# Patient Record
Sex: Female | Born: 1950
Health system: Southern US, Community
[De-identification: ages and names within clinical notes are randomized; demographics above are authoritative.]

## PROBLEM LIST (undated history)

## (undated) DIAGNOSIS — R112 Nausea with vomiting, unspecified: Secondary | ICD-10-CM

## (undated) DIAGNOSIS — Z9889 Other specified postprocedural states: Secondary | ICD-10-CM

## (undated) DIAGNOSIS — M1990 Unspecified osteoarthritis, unspecified site: Secondary | ICD-10-CM

## (undated) DIAGNOSIS — I10 Essential (primary) hypertension: Secondary | ICD-10-CM

## (undated) DIAGNOSIS — E119 Type 2 diabetes mellitus without complications: Secondary | ICD-10-CM

## (undated) DIAGNOSIS — E039 Hypothyroidism, unspecified: Secondary | ICD-10-CM

## (undated) HISTORY — PX: THYROIDECTOMY: SHX17

## (undated) HISTORY — PX: ABDOMINAL HYSTERECTOMY: SHX81

## (undated) HISTORY — PX: KNEE ARTHROSCOPY: SUR90

## (undated) HISTORY — PX: TUBAL LIGATION: SHX77

---
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
## 2001-05-02 ENCOUNTER — Encounter: Payer: Self-pay | Admitting: Family Medicine

## 2001-05-02 ENCOUNTER — Ambulatory Visit (HOSPITAL_COMMUNITY): Admission: RE | Admit: 2001-05-02 | Discharge: 2001-05-02 | Payer: Self-pay | Admitting: Family Medicine

## 2001-06-23 ENCOUNTER — Encounter: Payer: Self-pay | Admitting: *Deleted

## 2001-06-23 ENCOUNTER — Emergency Department (HOSPITAL_COMMUNITY): Admission: EM | Admit: 2001-06-23 | Discharge: 2001-06-23 | Payer: Self-pay | Admitting: Family Medicine

## 2009-01-17 ENCOUNTER — Emergency Department (HOSPITAL_COMMUNITY): Admission: EM | Admit: 2009-01-17 | Discharge: 2009-01-17 | Payer: Self-pay | Admitting: Emergency Medicine

## 2009-08-12 ENCOUNTER — Inpatient Hospital Stay (HOSPITAL_COMMUNITY): Admission: EM | Admit: 2009-08-12 | Discharge: 2009-08-13 | Payer: Self-pay | Admitting: Emergency Medicine

## 2010-09-08 LAB — DIFFERENTIAL
Basophils Absolute: 0.1 10*3/uL (ref 0.0–0.1)
Basophils Relative: 0 % (ref 0–1)
Basophils Relative: 0 % (ref 0–1)
Eosinophils Absolute: 0.1 10*3/uL (ref 0.0–0.7)
Eosinophils Relative: 1 % (ref 0–5)
Lymphocytes Relative: 8 % — ABNORMAL LOW (ref 12–46)
Lymphs Abs: 3.6 10*3/uL (ref 0.7–4.0)
Monocytes Absolute: 1.2 10*3/uL — ABNORMAL HIGH (ref 0.1–1.0)
Monocytes Relative: 8 % (ref 3–12)
Neutro Abs: 19.4 10*3/uL — ABNORMAL HIGH (ref 1.7–7.7)
Neutrophils Relative %: 86 % — ABNORMAL HIGH (ref 43–77)

## 2010-09-08 LAB — COMPREHENSIVE METABOLIC PANEL
ALT: 17 U/L (ref 0–35)
ALT: 22 U/L (ref 0–35)
AST: 16 U/L (ref 0–37)
AST: 21 U/L (ref 0–37)
Albumin: 3 g/dL — ABNORMAL LOW (ref 3.5–5.2)
Albumin: 4.2 g/dL (ref 3.5–5.2)
Alkaline Phosphatase: 54 U/L (ref 39–117)
Alkaline Phosphatase: 72 U/L (ref 39–117)
BUN: 15 mg/dL (ref 6–23)
CO2: 26 mEq/L (ref 19–32)
Calcium: 8.1 mg/dL — ABNORMAL LOW (ref 8.4–10.5)
Calcium: 9.5 mg/dL (ref 8.4–10.5)
Chloride: 105 mEq/L (ref 96–112)
Creatinine, Ser: 0.77 mg/dL (ref 0.4–1.2)
GFR calc Af Amer: >60 mL/min (ref >60)
GFR calc Af Amer: >60 mL/min (ref >60)
GFR calc non Af Amer: >60 mL/min (ref >60)
Glucose, Bld: 118 mg/dL — ABNORMAL HIGH (ref 70–99)
Glucose, Bld: 149 mg/dL — ABNORMAL HIGH (ref 70–99)
Potassium: 4.1 mEq/L (ref 3.5–5.1)
Potassium: 4.7 mEq/L (ref 3.5–5.1)
Sodium: 140 mEq/L (ref 135–145)
Sodium: 141 mEq/L (ref 135–145)
Total Bilirubin: 0.7 mg/dL (ref 0.3–1.2)
Total Protein: 6 g/dL (ref 6.0–8.3)
Total Protein: 7.6 g/dL (ref 6.0–8.3)

## 2010-09-08 LAB — CBC
HCT: 44.6 % (ref 36.0–46.0)
Hemoglobin: 12 g/dL (ref 12.0–15.0)
Hemoglobin: 14.7 g/dL (ref 12.0–15.0)
MCHC: 32.9 g/dL (ref 30.0–36.0)
MCHC: 34 g/dL (ref 30.0–36.0)
MCV: 88.4 fL (ref 78.0–100.0)
Platelets: 379 10*3/uL (ref 150–400)
RBC: 4.01 MIL/uL (ref 3.87–5.11)
RBC: 5.05 MIL/uL (ref 3.87–5.11)
RDW: 15.9 % — ABNORMAL HIGH (ref 11.5–15.5)
RDW: 16.2 % — ABNORMAL HIGH (ref 11.5–15.5)
WBC: 22.6 10*3/uL — ABNORMAL HIGH (ref 4.0–10.5)

## 2010-09-08 LAB — URINE CULTURE: Colony Count: >=100000

## 2010-09-08 LAB — URINE MICROSCOPIC-ADD ON

## 2010-09-08 LAB — URINALYSIS, ROUTINE W REFLEX MICROSCOPIC
Nitrite: NEGATIVE
Protein, ur: 30 mg/dL — AB
Specific Gravity, Urine: >1.03 — ABNORMAL HIGH (ref 1.005–1.030)
Urobilinogen, UA: 0.2 mg/dL (ref 0.0–1.0)

## 2010-09-08 LAB — LIPASE, BLOOD: Lipase: 27 U/L (ref 11–59)

## 2010-09-08 LAB — TSH: TSH: 4.956 u[IU]/mL — ABNORMAL HIGH (ref 0.350–4.500)

## 2013-11-25 ENCOUNTER — Other Ambulatory Visit (HOSPITAL_COMMUNITY): Payer: Self-pay | Admitting: Family Medicine

## 2013-11-25 ENCOUNTER — Encounter (INDEPENDENT_AMBULATORY_CARE_PROVIDER_SITE_OTHER): Payer: Self-pay

## 2013-11-25 ENCOUNTER — Ambulatory Visit (HOSPITAL_COMMUNITY)
Admission: RE | Admit: 2013-11-25 | Discharge: 2013-11-25 | Disposition: A | Discharge status: Home / Self Care | Payer: BC Managed Care – PPO | Source: Ambulatory Visit | Attending: Family Medicine | Admitting: Family Medicine

## 2013-11-25 DIAGNOSIS — M545 Low back pain, unspecified: Secondary | ICD-10-CM

## 2013-11-25 DIAGNOSIS — M47817 Spondylosis without myelopathy or radiculopathy, lumbosacral region: Secondary | ICD-10-CM | POA: Insufficient documentation

## 2013-11-25 DIAGNOSIS — M5430 Sciatica, unspecified side: Secondary | ICD-10-CM

## 2013-12-26 ENCOUNTER — Ambulatory Visit (HOSPITAL_COMMUNITY)
Admission: RE | Admit: 2013-12-26 | Discharge: 2013-12-26 | Disposition: A | Discharge status: Home / Self Care | Payer: BC Managed Care – PPO | Source: Ambulatory Visit | Attending: Orthopedic Surgery | Admitting: Orthopedic Surgery

## 2013-12-26 DIAGNOSIS — R269 Unspecified abnormalities of gait and mobility: Secondary | ICD-10-CM

## 2013-12-26 DIAGNOSIS — M25676 Stiffness of unspecified foot, not elsewhere classified: Secondary | ICD-10-CM | POA: Insufficient documentation

## 2013-12-26 DIAGNOSIS — M545 Low back pain, unspecified: Secondary | ICD-10-CM | POA: Insufficient documentation

## 2013-12-26 DIAGNOSIS — M546 Pain in thoracic spine: Secondary | ICD-10-CM | POA: Insufficient documentation

## 2013-12-26 DIAGNOSIS — M6281 Muscle weakness (generalized): Secondary | ICD-10-CM | POA: Insufficient documentation

## 2013-12-26 DIAGNOSIS — M25619 Stiffness of unspecified shoulder, not elsewhere classified: Secondary | ICD-10-CM | POA: Insufficient documentation

## 2013-12-26 DIAGNOSIS — M25673 Stiffness of unspecified ankle, not elsewhere classified: Secondary | ICD-10-CM | POA: Insufficient documentation

## 2013-12-26 DIAGNOSIS — IMO0001 Reserved for inherently not codable concepts without codable children: Secondary | ICD-10-CM | POA: Insufficient documentation

## 2013-12-26 DIAGNOSIS — M25659 Stiffness of unspecified hip, not elsewhere classified: Secondary | ICD-10-CM | POA: Insufficient documentation

## 2013-12-26 NOTE — Evaluation (Signed)
Physical Therapy Evaluation  Patient Details  Name: Patricia HuaDebbie A Diven MRN: [id number] Date of Birth: 04/21/1951  Today's Date: 12/26/2013 Time: 0930-1015 PT Time Calculation (min): 45 min     Charges: 1 evaluation, 1005-1015 therEx         Visit#: 1 of 16  Re-eval: 01/25/14 Assessment Diagnosis: Low back pain secondary to arthritis Prior Therapy: no  Authorization: BCBS    Authorization Time Period:    Authorization Visit#: 1 of 30   Past Medical History: No past medical history on file. Past Surgical History: No past surgical history on file.  Subjective Symptoms/Limitations Symptoms: Low back pain throbbing, catching Pertinent History: patient has a long history of low back pain for the last 17 years. last January (1.5 years ago), patient was walking 2 miles a day and she began to feel it catching and was unable to walk for extended perids since then. Pain is in thoracolumbar area radiating into Lt and occasionally Rt hip.  Limitations: Walking;Standing How long can you sit comfortably?: must change positions How long can you stand comfortably?: 45 minutes How long can you walk comfortably?: 20minutes Patient Stated Goals: to be able to wlk for 60minutes Pain Assessment Currently in Pain?: Yes Pain Score: 6  Pain Location: Back (between shoulder blades) Pain Orientation: Posterior;Lower Pain Onset: More than a month ago Pain Frequency: Constant Pain Relieving Factors: medication, ice, heat,  Effect of Pain on Daily Activities: pain with puch mowing  Cognition/Observation Observation/Other Assessments Observations: limited anterior to posterior mobility thoracic and lumbar spine throughout, tenderness along L3-L5 and PSIS Other Assessments: Hip alignment neutral  Sensation/Coordination/Flexibility/Functional Tests Flexibility Thomas: Positive Obers: Negative 90/90: Negative Functional Tests Functional Tests: 3D hip excursions: limited hip extension, Limited Rt  Frontal plane sway, Limited Lt hip Internal rotation Functional Tests: Walking: excessive toe out, limited hip internal rotation, limited hip internal rotatrion, llimited extension, limited dorsiflexion/eary heel rise. limited calcaneal eversion Functional Tests: Posture: Forward head, forward rounded shoulders,   Assessment LLE AROM (degrees) Left Hip External Rotation : 47 Left Hip Internal Rotation : 24 Left Ankle Dorsiflexion: 14 LLE Strength Left Hip Extension: 4/5 Left Hip ABduction: 3+/5 Lumbar AROM Lumbar Flexion: WNL Lumbar Extension: 28 Lumbar Strength Lumbar Flexion: 4/5 Lumbar Extension: 4/5  Exercise/Treatments Hip flexor stretch 5x 10 seconds Piriformis stretch 5x 10seconds Physical Therapy Assessment and Plan PT Assessment and Plan Clinical Impression Statement: Patient displasy thoracolumbar pain secodnary to hip, ankle and shoulder stifness, postural abnormalities includign increased forward head and forward rounded shoulders, and glut maximus and medius and trunk flexor/extensor weakness. Patient lumbar spine pain is therefore attribted to limited lifting mechanics that will be improved with correction of hip, ankle and shoulder mobility and strengtheing of glut and trunk muscles.  Pt will benefit from skilled therapeutic intervention in order to improve on the following deficits: Abnormal gait;Decreased activity tolerance;Decreased strength;Impaired flexibility;Decreased range of motion;Improper body mechanics;Pain Rehab Potential: Good PT Frequency: Min 2X/week PT Duration: 8 weeks PT Treatment/Interventions: Gait training;Stair training;Therapeutic activities;Therapeutic exercise;Modalities;Manual techniques;Patient/family education PT Plan: Initia;l focus: improve glut, piriformis, hip flexor, rectus femoris, and pectoral mobility, and introduce deep neck flexor strengthening and throacic spine mobility exercises. As pain and mobility improve progress and add glut  strengtheing, mid and lower trapezious and rowing strenghtieng exercises to improve body mechanics for lifting, and increase trunk stabilization .     Goals PT Short Term Goals Time to Complete Short Term Goals: 4 weeks PT Short Term Goal 1: Patient will display increased hip internal roation  to 40 degrees to increase shock absorbtion during transitioanl zone one of gait PT Short Term Goal 2: Patient will increase hip extesnion to 15 degrees in improve rectos femoris/illiopsas flexibity resulting in a negative thomas test so patient can ambulate with increased stride length and decreased forward bending PT Short Term Goal 3: Patient will increase anke dorsiflexion to 20 degrees to improve early heel off during gait PT Short Term Goal 4: Patient will be able to ambulate >40 minutes with pain <3/10 PT Short Term Goal 5: Patient will increase shoulder horizontal abduction to 30 degrees indicatign improved pectoralis mobility PT Long Term Goals Time to Complete Long Term Goals: 8 weeks PT Long Term Goal 1: Patient will increased hip extension strength to 4+/5 MMT to be able to squat to the ground and lift 10lb off the floor 20x without low back pain PT Long Term Goal 2: Patient will increase hip abduction strength to 4+/5 MMT to be able to stand on one leg >10 seconds withtou trendelenberg sign Long Term Goal 3: Patient will increased trunk flexion/extesnion strength to 5/5/ MMT to be able to stand/walk > 1 hour without pain >2/10  Problem List Patient Active Problem List   Diagnosis Date Noted  . Pain in thoracic spine 12/26/2013  . Lumbago 12/26/2013  . Stiffness of joint, not elsewhere classified, pelvic region and thigh 12/26/2013  . Abnormality of gait 12/26/2013    PT - End of Session Activity Tolerance: Patient tolerated treatment well General Behavior During Therapy: WFL for tasks assessed/performed PT Plan of Care PT Home Exercise Plan: Piriformis and hip flexor stretches1 way (to  be progressed to 3 way next session  GP    Tysheem Accardo R 12/26/2013, 12:50 PM  Physician Documentation Your signature is required to indicate approval of the treatment plan as stated above.  Please sign and either send electronically or make a copy of this report for your files and return this physician signed original.   Please mark one 1.__approve of plan  2. ___approve of plan with the following conditions.   ______________________________                                                          _____________________ Physician Signature                                                                                                             Date

## 2013-12-31 ENCOUNTER — Ambulatory Visit (HOSPITAL_COMMUNITY)
Admission: RE | Admit: 2013-12-31 | Discharge: 2013-12-31 | Disposition: A | Discharge status: Home / Self Care | Payer: BC Managed Care – PPO | Source: Ambulatory Visit | Attending: Orthopedic Surgery | Admitting: Orthopedic Surgery

## 2013-12-31 NOTE — Progress Notes (Signed)
Physical Therapy Treatment Patient Details  Name: Patricia HuaDebbie A Vanzandt MRN: [id number] Date of Birth: 09/23/1950  Today's Date: 12/31/2013 Time: 1020-1100 PT Time Calculation (min): 40 min Charge: TE 1020-1120  Visit#: 2 of 16  Re-eval: 01/25/14    Authorization: BCBS  Authorization Time Period:    Authorization Visit#: 2 of 30   Subjective: Symptoms/Limitations Symptoms: Pt reports compliance with HEP, stated Lt butrocks region dull pain scale 3/10 Pain Assessment Currently in Pain?: Yes Pain Score: 3  Pain Location: Buttocks Pain Orientation: Left  Objective   Exercise/Treatments Stretches Active Hamstring Stretch: 3 reps;30 seconds;Limitations Active Hamstring Stretch Limitations: standing 14 in box Hip Flexor Stretch: 2 reps;30 seconds Quad Stretch: 3 reps;30 seconds;Limitations Quad Stretch Limitations: kneeling  Piriformis Stretch: 3 reps;10 seconds;Limitations Piriformis Stretch Limitations: seated with LE crossed Standing Other Standing Lumbar Exercises: 3D hip excursion Seated Other Seated Lumbar Exercises: 3D thoracic excursion 5x with UE reach 5x 3"; 3D neck stretch    Physical Therapy Assessment and Plan PT Assessment and Plan Clinical Impression Statement: Began PT POC to improve hip mobility and LE flexibiltiy.  Pt educated on improtance of posture to reduce strain on lumbar musculature.  Began 3D thoracic excursion with therapist facilitation to improve extension.  Pt given HEP worksheet to improve thoracic and hip mobility.  No reports of pain through session.   PT Plan: Initia;l focus: improve glut, piriformis, hip flexor, rectus femoris, and pectoral mobility, and introduce deep neck flexor strengthening and throacic spine mobility exercises. As pain and mobility improve progress and add glut strengtheing, mid and lower trapezious and rowing strenghtieng exercises to improve body mechanics for lifting, and increase trunk stabilization .     Goals PT Short  Term Goals PT Short Term Goal 1: Patient will display increased hip internal roation to 40 degrees to increase shock absorbtion during transitioanl zone one of gait PT Short Term Goal 1 - Progress: Progressing toward goal PT Short Term Goal 2: Patient will increase hip extesnion to 15 degrees in improve rectos femoris/illiopsas flexibity resulting in a negative thomas test so patient can ambulate with increased stride length and decreased forward bending PT Short Term Goal 2 - Progress: Progressing toward goal PT Short Term Goal 3: Patient will increase anke dorsiflexion to 20 degrees to improve early heel off during gait PT Short Term Goal 4: Patient will be able to ambulate >40 minutes with pain <3/10 PT Short Term Goal 5: Patient will increase shoulder horizontal abduction to 30 degrees indicatign improved pectoralis mobility PT Long Term Goals PT Long Term Goal 1: Patient will increased hip extension strength to 4+/5 MMT to be able to squat to the ground and lift 10lb off the floor 20x without low back pain PT Long Term Goal 2: Patient will increase hip abduction strength to 4+/5 MMT to be able to stand on one leg >10 seconds withtou trendelenberg sign Long Term Goal 3: Patient will increased trunk flexion/extesnion strength to 5/5/ MMT to be able to stand/walk > 1 hour without pain >2/10 Long Term Goal 3 Progress: Progressing toward goal  Problem List Patient Active Problem List   Diagnosis Date Noted  . Pain in thoracic spine 12/26/2013  . Lumbago 12/26/2013  . Stiffness of joint, not elsewhere classified, pelvic region and thigh 12/26/2013  . Abnormality of gait 12/26/2013    PT - End of Session Activity Tolerance: Patient tolerated treatment well General [facility] Therapy[facility] PT Plan of Care PT Home Exercise Plan: 3D  hip and thoracic excursion.    GP    Juel Burrow 12/31/2013, 12:10 PM

## 2014-01-02 ENCOUNTER — Ambulatory Visit (HOSPITAL_COMMUNITY)
Admission: RE | Admit: 2014-01-02 | Discharge: 2014-01-02 | Disposition: A | Discharge status: Home / Self Care | Payer: BC Managed Care – PPO | Source: Ambulatory Visit | Attending: Orthopedic Surgery | Admitting: Orthopedic Surgery

## 2014-01-02 NOTE — Progress Notes (Signed)
Physical Therapy Treatment Patient Details  Name: Patricia [name] MRN: [id number] Date of Birth: [birth year]  Today's Date: 01/02/2014 Time: 1102-1145 PT Time Calculation (min): 43 min Visit#: 3 of 16  Re-eval: 01/25/14 Authorization: BCBS  Authorization Visit#: 3 of 30  Charges:  Manual 1102-1110 (8'), self care 1110-1118 (8'), therex 1119-1145 (26')  Subjective: Symptoms/Limitations Symptoms: Pt states she is hurting today in her Lt PSIS region.  States it is basically the same as last visit.  Pain Assessment Currently in Pain?: Yes Pain Score: 3  Pain Location: Buttocks Pain Orientation: Left   Exercise/Treatments Stretches Active Hamstring Stretch: 3 reps;30 seconds;Limitations Active Hamstring Stretch Limitations: standing 14 in box Hip Flexor Stretch: 2 reps;30 seconds Aerobic Tread Mill: 5' 1.0 mph incline 3 following MET  Standing Other Standing Lumbar Exercises: 3D hip excursion Other Standing Lumbar Exercises: gastroc stretch 3X30"    Manual Therapy Manual Therapy: Other (comment) Other Manual Therapy: Muscle energy technique for Lt anterior rotation f/b walking on treadmill  Physical Therapy Assessment and Plan PT Assessment and Plan Clinical Impression Statement: SI assessed with noted Lt anterior rotation.  Muscle energy technique performed with good results and even posturing. Pt educated on importance of hip alignment and mechanism of muscle imbalance.   Continued to improve hip mobility and LE flexibiltiy. No reports of pain through session and reported being painfree at end of session. PT Plan: Initial focus: improve glut, piriformis, hip flexor, rectus femoris, and pectoral mobility, and introduce deep neck flexor strengthening and throacic spine mobility exercises. As pain and mobility improve progress and add glut strengtheing, mid and lower trapezious and rowing strenghtieng exercises to improve body mechanics for lifting, and increase trunk stabilization  .      Problem List Patient Active Problem List   Diagnosis Date Noted  . Pain in thoracic spine 12/26/2013  . Lumbago 12/26/2013  . Stiffness of joint, not elsewhere classified, pelvic region and thigh 12/26/2013  . Abnormality of gait 12/26/2013    PT - End of Session Activity Tolerance: Patient tolerated treatment well General Behavior During Therapy: WFL for tasks assessed/performed PT Plan of Care PT Home Exercise Plan: 3D hip and thoracic excursion.    GP    Teena Irani, PTA/CLT 01/02/2014, 11:47 AM

## 2014-01-07 ENCOUNTER — Ambulatory Visit (HOSPITAL_COMMUNITY)
Admission: RE | Admit: 2014-01-07 | Discharge: 2014-01-07 | Disposition: A | Discharge status: Home / Self Care | Payer: BC Managed Care – PPO | Source: Ambulatory Visit | Attending: Orthopedic Surgery | Admitting: Orthopedic Surgery

## 2014-01-07 NOTE — Progress Notes (Signed)
Physical Therapy Treatment Patient Details  Name: Patricia [name] MRN: [id number] Date of Birth: Oct 27, 1950  Today's Date: 01/07/2014 Time: 4098-1191 PT Time Calculation (min): 41 min  Visit#: 4 of 16  Re-eval: 01/25/14 Authorization: BCBS  Authorization Visit#: 4 of 30  Charges:  Manual 1104-1114 (10'), therex 1115-1145 (30)  Subjective: Symptoms/Limitations Symptoms: Pt states her lower back and hips feel much better,  however having pain in her thoracic area.   Pain Assessment Currently in Pain?: Yes Pain Score: 6  Pain Location: Thoracic Pain Orientation: Mid   Exercise/Treatments Stretches Active Hamstring Stretch: 3 reps;30 seconds;Limitations Active Hamstring Stretch Limitations: standing 8 in box Standing Extension: Limitations Standing Extension Limitations: corner stretch 3X30" Piriformis Stretch: 3 reps;30 seconds Piriformis Stretch Limitations: seated with LE crossed Standing Functional Squats: Limitations Functional Squats Limitations: 3D hip excursions 10 reps each Scapular Retraction: 10 reps;Theraband Theraband Level (Scapular Retraction): Level 3 (Green) Row: 10 reps;Theraband Theraband Level (Row): Level 3 (Green) Shoulder Extension: 10 reps;Theraband Theraband Level (Shoulder Extension): Level 3 (Green) Other Standing Lumbar Exercises: SLS max of 3:  Lt: 21", Rt: 15" Other Standing Lumbar Exercises: gastroc stretch 3X30" Seated Other Seated Lumbar Exercises: 3D thoracic excursion 5x with UE reach 5x 3"; 3D neck stretch Prone  Other Prone Lumbar Exercises: rows, shoulder extension 10 reps Other Prone Lumbar Exercises: heelsqueezes 10X5" holds   Manual Therapy Manual Therapy: Massage Massage: prone thoracic region to decrease spasms/pain  Physical Therapy Assessment and Plan PT Assessment and Plan Clinical Impression Statement: Focused on thoracic region today.  Progressed with prone stab exercises and therband activities to increase postural  stregnth and stability.  Massage to thoracic region in prone revealed small spasm Lt rhomboid region and general tightness throughout.  Overall pain reduction to 3/10 at end of session.  SI assessed without need for MET today.  PT Plan: Initia;l focus: improve glut, piriformis, hip flexor, rectus femoris, and pectoral mobility, and introduce deep neck flexor strengthening and throacic spine mobility exercises. As pain and mobility improve progress and add glut strengtheing, mid and lower trapezious and rowing strenghtieng exercises to improve body mechanics for lifting, and increase trunk stabilization .      Problem List Patient Active Problem List   Diagnosis Date Noted  . Pain in thoracic spine 12/26/2013  . Lumbago 12/26/2013  . Stiffness of joint, not elsewhere classified, pelvic region and thigh 12/26/2013  . Abnormality of gait 12/26/2013    PT - End of Session Activity Tolerance: Patient tolerated treatment well General Behavior During Therapy: WFL for tasks assessed/performed PT Plan of Care PT Home Exercise Plan: 3D hip and thoracic excursion.     Teena Irani, PTA/CLT 01/07/2014, 11:46 AM

## 2014-01-09 ENCOUNTER — Ambulatory Visit (HOSPITAL_COMMUNITY)
Admission: RE | Admit: 2014-01-09 | Discharge: 2014-01-09 | Disposition: A | Discharge status: Home / Self Care | Payer: BC Managed Care – PPO | Source: Ambulatory Visit | Attending: Orthopedic Surgery | Admitting: Orthopedic Surgery

## 2014-01-09 NOTE — Progress Notes (Signed)
Physical Therapy Treatment Patient Details  Name: Patricia [name] MRN: [id number] Date of Birth: 09/03/1950  Today's Date: 01/09/2014 Time: 1100-1140 PT Time Calculation (min): 40 min  Visit#: 5 of 16  Re-eval: 01/25/14 Authorization: BCBS  Authorization Visit#: 4 of 30 Charges:  therex 38'   Subjective: Symptoms/Limitations Symptoms: Pt states she had a little pain in her thoracic area after last visit but none since then.  Currently painfree. Pain Assessment Currently in Pain?: No/denies   Exercise/Treatments Stretches Active Hamstring Stretch: 2 reps;30 seconds;Limitations Active Hamstring Stretch Limitations: 3 way, standing 8 in box Standing Extension: Limitations Standing Extension Limitations: corner stretch 2X30" Standing Functional Squats: Limitations Functional Squats Limitations: 3D hip excursions 10 reps each Scapular Retraction: 10 reps;Theraband Theraband Level (Scapular Retraction): Level 3 (Green) Row: 10 reps;Theraband Theraband Level (Row): Level 3 (Green) Shoulder Extension: 10 reps;Theraband Theraband Level (Shoulder Extension): Level 3 (Green) Other Standing Lumbar Exercises: gastroc stretch 2X30" Seated Other Seated Lumbar Exercises: 3D thoracic excursion 5x with UE reach 5x 3"; 3D neck stretch Prone  Other Prone Lumbar Exercises: rows, shoulder extension 10 reps Other Prone Lumbar Exercises: heelsqueezes 10X5" holds, alternating UE 5X, alternating LE 5X    Manual Therapy Manual Therapy: Massage Massage: prone thoracic region to decrease spasms/pain.  Physical Therapy Assessment and Plan PT Assessment and Plan Clinical Impression Statement: Continued to focus on thoracic region.  Checked SI alignment and is neutral.  Pt reports compliance with HEP.  No spasms/tightness palpated with manual to thoracic region, however patient reported tenderness/soreness to area.  Added prone UE/LE stab exercises and given theraband/written instructions for HEP.    PT Plan: Progress  glut strengthening.  Educate on proper body mechanics/lifting and continue to increase trunk stability.     Problem List Patient Active Problem List   Diagnosis Date Noted  . Pain in thoracic spine 12/26/2013  . Lumbago 12/26/2013  . Stiffness of joint, not elsewhere classified, pelvic region and thigh 12/26/2013  . Abnormality of gait 12/26/2013    PT - End of Session Activity Tolerance: Patient tolerated treatment well General Behavior During Therapy: WFL for tasks assessed/performed PT Plan of Care PT Home Exercise Plan: 3D hip and thoracic excursion.     Lurena NidaAmy B Haeleigh Streiff, PTA/CLT 01/09/2014, 11:52 AM

## 2014-01-14 ENCOUNTER — Ambulatory Visit (HOSPITAL_COMMUNITY)
Admission: RE | Admit: 2014-01-14 | Discharge: 2014-01-14 | Disposition: A | Discharge status: Home / Self Care | Payer: BC Managed Care – PPO | Source: Ambulatory Visit | Attending: Orthopedic Surgery | Admitting: Orthopedic Surgery

## 2014-01-14 NOTE — Progress Notes (Signed)
Physical Therapy Treatment Patient Details  Name: Patricia [name] MRN: [id number] Date of Birth: 02/28/1951  Today's Date: 01/14/2014 Time: 1100-1145 PT Time Calculation (min): 45 min  Visit#: 6 of 16  Re-eval: 01/25/14 Authorization: BCBS  Authorization Visit#: 6 of 30  Charges:  Manual 1100-1120 (20'), therex 1120-1145 (25')  Subjective: Symptoms/Limitations Symptoms: Pt states her pain is a lttle elevated today in her thoracic region and Lt SI due to push mowing her yard. Pain Assessment Currently in Pain?: Yes Pain Score: 2  Pain Location: Hip Pain Orientation: Left Pain Radiating Towards: and pain in thoracic region 2/10   Exercise/Treatments Stretches Active Hamstring Stretch: 2 reps;30 seconds;Limitations Active Hamstring Stretch Limitations: 3 way, standing 8 in box Standing Extension: Limitations Standing Extension Limitations: corner stretch 2X30" Aerobic Tread Mill: 5' 1.0 mph incline 3 following MET  Standing Functional Squats: Limitations Functional Squats Limitations: 3D hip excursions 10 reps each Forward Lunge: 10 reps;Limitations Forward Lunge Limitations: 6" step bilaterally Seated Other Seated Lumbar Exercises: 3D thoracic excursion 5x with UE reach 5x 3" Prone  Other Prone Lumbar Exercises: rows, shoulder extension, alternating UE/LE 10 reps Other Prone Lumbar Exercises: heelsqueezes 10X5" holds, alternating UE 5X, alternating LE 5X   Manual Therapy Manual Therapy: Other (comment) Massage: prone thoracic region to decrease spasms/pain Other Manual Therapy: Muscle energy technique for Lt anterior rotation f/b walking on treadmill  Physical Therapy Assessment and Plan PT Assessment and Plan Clinical Impression Statement: Pt with Lt anterior hip rotation and pain in thoracic region due to push mowing yard.   MET performed and corrected misalignment.  Prone massage to thoracic region aleviated pain in scapular region.   Reviewed body mechanics with  push/pull and able to demonstrate correct form.  will continue to progress body mechanics.  No pain at concusion of session. PT Plan: Progress  glut strengthening.  Educate on proper body mechanics/lifting and continue to increase trunk stability.     Problem List Patient Active Problem List   Diagnosis Date Noted  . Pain in thoracic spine 12/26/2013  . Lumbago 12/26/2013  . Stiffness of joint, not elsewhere classified, pelvic region and thigh 12/26/2013  . Abnormality of gait 12/26/2013    PT - End of Session Activity Tolerance: Patient tolerated treatment well General Behavior During Therapy: WFL for tasks assessed/performed PT Plan of Care PT Home Exercise Plan: 3D hip and thoracic excursion.     Amy B Frazier, PTA/CLT 01/14/2014, 11:47 AM  

## 2014-01-16 ENCOUNTER — Ambulatory Visit (HOSPITAL_COMMUNITY)
Admission: RE | Admit: 2014-01-16 | Discharge: 2014-01-16 | Disposition: A | Discharge status: Home / Self Care | Payer: BC Managed Care – PPO | Source: Ambulatory Visit | Attending: Orthopedic Surgery | Admitting: Orthopedic Surgery

## 2014-01-16 NOTE — Progress Notes (Signed)
Physical Therapy Treatment Patient Details  Name: Patricia [name] MRN: [id number] Date of Birth: [birth year]  Today's Date: 01/16/2014 Time: 1100-1140 PT Time Calculation (min): 40 min  Visit#: 7 of 16  Re-eval: 01/25/14 Authorization: BCBS  Authorization Visit#: 7 of 30  Charges:  Manual 1100-1110, therex 1110-1140  Subjective: Symptoms/Limitations Symptoms: Pt reports no pain today.   Pain Assessment Currently in Pain?: No/denies   Exercise/Treatments Stretches Active Hamstring Stretch: 2 reps;30 seconds;Limitations Active Hamstring Stretch Limitations: 3 way, standing 8 in box Standing Extension: Limitations Standing Extension Limitations: corner stretch 2X30" Standing Functional Squats: Limitations Functional Squats Limitations: 3D hip excursions 10 reps each Forward Lunge: 10 reps;Limitations Forward Lunge Limitations: 6" step bilaterally Other Standing Lumbar Exercises: gastroc stretch 2X30" Seated Other Seated Lumbar Exercises: 3D thoracic excursion 5x with UE reach 5x 3" Prone  Other Prone Lumbar Exercises: rows 15 reps, shoulder extension 15 reps, alternating UE/LE 10 reps Other Prone Lumbar Exercises: heelsqueezes 15X5" holds    Manual Therapy Manual Therapy: Other (comment) Massage: prone thoracic region to decrease spasms/pain,  No MET required today  Physical Therapy Assessment and Plan PT Assessment and Plan Clinical Impression Statement: Pt with good SI alignment today without need for MET.  Most tightness/spasm located in Lt thoracic region and able to resolve with manual techniques.  Able to increase reps with prone exercises today without difficulty.  Pt continued to report being painfree at end of session.   PT Plan: Progress  glut strengthening.  Educate on proper body mechanics/lifting and continue to increase trunk stability.     Problem List Patient Active Problem List   Diagnosis Date Noted  . Pain in thoracic spine 12/26/2013  . Lumbago  12/26/2013  . Stiffness of joint, not elsewhere classified, pelvic region and thigh 12/26/2013  . Abnormality of gait 12/26/2013    PT - End of Session Activity Tolerance: Patient tolerated treatment well General Behavior During Therapy: [facility] for tasks assessed/performed  GP    Teena Irani, PTA/CLT 01/16/2014, 11:39 AM

## 2014-01-20 ENCOUNTER — Ambulatory Visit (HOSPITAL_COMMUNITY)
Admission: RE | Admit: 2014-01-20 | Discharge: 2014-01-20 | Disposition: A | Discharge status: Home / Self Care | Payer: BC Managed Care – PPO | Source: Ambulatory Visit | Attending: Family Medicine | Admitting: Family Medicine

## 2014-01-20 DIAGNOSIS — M25659 Stiffness of unspecified hip, not elsewhere classified: Secondary | ICD-10-CM | POA: Insufficient documentation

## 2014-01-20 DIAGNOSIS — M25676 Stiffness of unspecified foot, not elsewhere classified: Secondary | ICD-10-CM | POA: Diagnosis not present

## 2014-01-20 DIAGNOSIS — M546 Pain in thoracic spine: Secondary | ICD-10-CM | POA: Diagnosis not present

## 2014-01-20 DIAGNOSIS — M545 Low back pain, unspecified: Secondary | ICD-10-CM | POA: Insufficient documentation

## 2014-01-20 DIAGNOSIS — M25673 Stiffness of unspecified ankle, not elsewhere classified: Secondary | ICD-10-CM | POA: Insufficient documentation

## 2014-01-20 DIAGNOSIS — IMO0001 Reserved for inherently not codable concepts without codable children: Secondary | ICD-10-CM | POA: Diagnosis not present

## 2014-01-20 DIAGNOSIS — M6281 Muscle weakness (generalized): Secondary | ICD-10-CM | POA: Diagnosis not present

## 2014-01-20 DIAGNOSIS — R269 Unspecified abnormalities of gait and mobility: Secondary | ICD-10-CM | POA: Diagnosis not present

## 2014-01-20 DIAGNOSIS — M25619 Stiffness of unspecified shoulder, not elsewhere classified: Secondary | ICD-10-CM | POA: Insufficient documentation

## 2014-01-20 NOTE — Progress Notes (Signed)
Physical Therapy Treatment Patient Details  Name: Patricia [name] MRN: [id number] Date of Birth: [birth year]  Today's Date: 01/20/2014 Time: 0940-1025 PT Time Calculation (min): 45 min Charge; TE [id number], Manual 1607-3710  Visit#: 8 of 16  Re-eval: 01/25/14 Assessment Diagnosis: Low back pain secondary to arthritis Prior Therapy: no  Authorization: BCBS  Authorization Time Period:    Authorization Visit#: 8 of 30   Subjective: Symptoms/Limitations Symptoms: Pt reports she is pain free today, compliant with HEP Pain Assessment Currently in Pain?: No/denies  Objective:   Exercise/Treatments Stretches Active Hamstring Stretch: 2 reps;30 seconds;Limitations Active Hamstring Stretch Limitations: 3 way, standing 8 in box Hip Flexor Stretch: 2 reps;30 seconds Standing Extension: Limitations Standing Extension Limitations: corner stretch 3X30" Piriformis Stretch: 3 reps;30 seconds Piriformis Stretch Limitations: seated with LE crossed Standing Functional Squats: Limitations Functional Squats Limitations: 3D hip excursions 10 reps each; 3 reps squat reach matrix with 1# Other Standing Lumbar Exercises: gastroc stretch 2X30" Seated Other Seated Lumbar Exercises: 3D thoracic excursion 5x with UE reach 10x 3" Other Seated Lumbar Exercises: Manual assistance with thoracic extension  Prone  Other Prone Lumbar Exercises: rows 15 reps 1#, wback 10x, horizontal abduction 10x, shoulder extension 15 reps, alternating UE/LE 10 reps  Manual Therapy Manual Therapy: Other (comment) Massage: prone thoracic region to decrease spasms/pain, Other Manual Therapy: SI within alignment, no MET required today.    Physical Therapy Assessment and Plan PT Assessment and Plan Clinical Impression Statement: Pt improving core musculature strengthening, SI within alignment with no MET required this session.  Session focus on improving hip mobility, gluteal and posture strengtheing.  Added squat reach  matrix and prone postural strengthening activities, pt able to demonstrate appropriate technique with all exercises with min cueing required for technique.  Able to resolve spasms in Lt Rhomboid and scapular region with manual techniques..  No reports of increased pain through session   PT Plan: Progress  glut strengthening.  Educate on proper body mechanics/lifting and continue to increase trunk stability.  Reassess next session.    Goals PT Short Term Goals PT Short Term Goal 1: Patient will display increased hip internal roation to 40 degrees to increase shock absorbtion during transitioanl zone one of gait PT Short Term Goal 1 - Progress: Progressing toward goal PT Short Term Goal 2: Patient will increase hip extesnion to 15 degrees in improve rectos femoris/illiopsas flexibity resulting in a negative thomas test so patient can ambulate with increased stride length and decreased forward bending PT Short Term Goal 2 - Progress: Progressing toward goal PT Short Term Goal 3: Patient will increase anke dorsiflexion to 20 degrees to improve early heel off during gait PT Short Term Goal 3 - Progress: Progressing toward goal PT Short Term Goal 4: Patient will be able to ambulate >40 minutes with pain <3/10 PT Short Term Goal 4 - Progress: Progressing toward goal PT Short Term Goal 5: Patient will increase shoulder horizontal abduction to 30 degrees indicatign improved pectoralis mobility PT Short Term Goal 5 - Progress: Progressing toward goal PT Long Term Goals PT Long Term Goal 1: Patient will increased hip extension strength to 4+/5 MMT to be able to squat to the ground and lift 10lb off the floor 20x without low back pain PT Long Term Goal 1 - Progress: Progressing toward goal PT Long Term Goal 2: Patient will increase hip abduction strength to 4+/5 MMT to be able to stand on one leg >10 seconds withtou trendelenberg sign PT Long Term Goal 2 -  Progress: Progressing toward goal Long Term Goal 3:  Patient will increased trunk flexion/extesnion strength to 5/5/ MMT to be able to stand/walk > 1 hour without pain >2/10  Problem List Patient Active Problem List   Diagnosis Date Noted  . Pain in thoracic spine 12/26/2013  . Lumbago 12/26/2013  . Stiffness of joint, not elsewhere classified, pelvic region and thigh 12/26/2013  . Abnormality of gait 12/26/2013    PT - End of Session Activity Tolerance: Patient tolerated treatment well General Behavior During Therapy: [employer] for tasks assessed/performed  GP    Aldona Lento 01/20/2014, 10:56 AM

## 2014-01-21 ENCOUNTER — Ambulatory Visit (HOSPITAL_COMMUNITY)
Admission: RE | Admit: 2014-01-21 | Discharge: 2014-01-21 | Disposition: A | Discharge status: Home / Self Care | Payer: BC Managed Care – PPO | Source: Ambulatory Visit | Attending: Family Medicine | Admitting: Family Medicine

## 2014-01-21 DIAGNOSIS — IMO0001 Reserved for inherently not codable concepts without codable children: Secondary | ICD-10-CM | POA: Diagnosis not present

## 2014-01-21 NOTE — Progress Notes (Signed)
Physical Therapy Re-evaluation/Treatment Note/ Discharge summary  Patient Details  Name: MONISHA SIEBEL MRN: [id number] Date of Birth: 08/26/1950  Today's Date: 01/21/2014 Time: 6144-3154 PT Time Calculation (min): 38 min Charge: TE [id number], MMT/ROM Measurement 5093-2671              Visit#: 9 of 16  Re-eval: 01/25/14 Assessment Diagnosis: Low back pain secondary to arthritis Next MD Visit: Mina Marble 02/04/2014 Prior Therapy: no  Authorization: BCBS    Authorization Time Period:    Authorization Visit#: 9 of 30   Subjective Symptoms/Limitations Symptoms: Pain free today, was sore following session  How long can you sit comfortably?: 2-3 hours (must change positions) How long can you stand comfortably?: 60-90 minutes (was 45 minutes) How long can you walk comfortably?: 60-90  minutes (was 61mnutes) Pain Assessment Currently in Pain?: No/denies  Prior Functioning  Home Living Additional Comments: FOTO 76% status (was 88% status)  Sensation/Coordination/Flexibility/Functional Tests Flexibility Thomas: Negative Obers: Negative 90/90: Negative  Assessment LLE AROM (degrees) Left Hip External Rotation : 47 Left Hip Internal Rotation : 40 (was 40) Left Ankle Dorsiflexion: 20 (was 14) LLE Strength Left Hip Extension: 5/5 (4/5) Left Hip ABduction: 5/5 (was 3+/5) Lumbar AROM Lumbar Flexion: WNL Lumbar Extension: 32 (was 28) Lumbar Strength Lumbar Flexion: 4/5 Lumbar Extension: 4/5  Exercise/Treatments Stretches Active Hamstring Stretch: 2 reps;30 seconds;Limitations Active Hamstring Stretch Limitations: 3 way, standing 8 in box Hip Flexor Stretch: 2 reps;30 seconds Standing Extension: Limitations Standing Extension Limitations: corner stretch 3X30" Piriformis Stretch: 3 reps;30 seconds Piriformis Stretch Limitations: seated with LE crossed Standing Functional Squats: Limitations Functional Squats Limitations: 3D hip excursions 10 reps each; 3 reps squat reach  matrix with 1#    Physical Therapy Assessment and Plan PT Assessment and Plan Clinical Impression Statement: Reassessment complete with the following findings: Pt complinat with HEP and able to demonstrate appropriate form and technqiues wtih all stretches and exercies. Improved hip mobilty and improved strength. Improved core strength wtih no muscle energy technique for SI alignment required for several sessions now. Pt reports pain free PT Plan: D/C to HEP.    Goals PT Short Term Goals PT Short Term Goal 1: Patient will display increased hip internal roation to 40 degrees to increase shock absorbtion during transitioanl zone one of gait PT Short Term Goal 1 - Progress: Met PT Short Term Goal 2: Patient will increase hip extesnion to 15 degrees in improve rectos femoris/illiopsas flexibity resulting in a negative thomas test so patient can ambulate with increased stride length and decreased forward bending PT Short Term Goal 2 - Progress: Met PT Short Term Goal 3: Patient will increase anke dorsiflexion to 20 degrees to improve early heel off during gait PT Short Term Goal 3 - Progress: Met PT Short Term Goal 4: Patient will be able to ambulate >40 minutes with pain <3/10 PT Short Term Goal 4 - Progress: Met PT Short Term Goal 5: Patient will increase shoulder horizontal abduction to 30 degrees indicatign improved pectoralis mobility PT Short Term Goal 5 - Progress: Met PT Long Term Goals PT Long Term Goal 1: Patient will increased hip extension strength to 4+/5 MMT to be able to squat to the ground and lift 10lb off the floor 20x without low back pain PT Long Term Goal 1 - Progress: Met PT Long Term Goal 2: Patient will increase hip abduction strength to 4+/5 MMT to be able to stand on one leg >10 seconds withtou trendelenberg sign PT Long Term Goal 2 -  Progress: Met Long Term Goal 3: Patient will increased trunk flexion/extesnion strength to 5/5/ MMT to be able to stand/walk > 1 hour  without pain >2/10 Long Term Goal 3 Progress: Met  Problem List Patient Active Problem List   Diagnosis Date Noted  . Pain in thoracic spine 12/26/2013  . Lumbago 12/26/2013  . Stiffness of joint, not elsewhere classified, pelvic region and thigh 12/26/2013  . Abnormality of gait 12/26/2013    PT - End of Session Activity Tolerance: Patient tolerated treatment well General Behavior During Therapy: [facility] for tasks assessed/performed  GP    Aldona Lento 01/21/2014, 4:49 PM  Physician Documentation Your signature is required to indicate approval of the treatment plan as stated above.  Please sign and either send electronically or make a copy of this report for your files and return this physician signed original.   Please mark one 1.__approve of plan  2. ___approve of plan with the following conditions.   ______________________________                                                          _____________________ Physician Signature                                                                                                             Date

## 2014-01-23 NOTE — Progress Notes (Signed)
Physical Therapy Re-evaluation/Treatment Note/ Discharge summary  Patient Details  Name: Patricia [name] MRN: [id number] Date of Birth: 18-May-1951  Today's Date: 01/21/2014 Time: 7026-3785 PT Time Calculation (min): 38 min Charge: TE 8850-2774, MMT/ROM Measurement [id number]              Visit#: 9 of 16  Re-eval: 01/25/14 Assessment Diagnosis: Low back pain secondary to arthritis Next MD Visit: Mina Marble 02/04/2014 Prior Therapy: no  Authorization: BCBS    Authorization Visit#: 9 of 30   Subjective Symptoms/Limitations Symptoms: Pain free today, was sore following session  How long can you sit comfortably?: 2-3 hours (must change positions) How long can you stand comfortably?: 60-90 minutes (was 45 minutes) How long can you walk comfortably?: 60-90  minutes (was 27mnutes) Pain Assessment Currently in Pain?: No/denies  Prior Functioning  Home Living Additional Comments: FOTO 76% status (was 88% status)  Sensation/Coordination/Flexibility/Functional Tests Flexibility Thomas: Negative Obers: Negative 90/90: Negative  Assessment LLE AROM (degrees) Left Hip External Rotation : 47 Left Hip Internal Rotation : 40 (was 40) Left Ankle Dorsiflexion: 20 (was 14) LLE Strength Left Hip Extension: 5/5 (4/5) Left Hip ABduction: 5/5 (was 3+/5) Lumbar AROM Lumbar Flexion: WNL Lumbar Extension: 32 (was 28) Lumbar Strength Lumbar Flexion: 4/5 Lumbar Extension: 4/5  Exercise/Treatments Stretches Active Hamstring Stretch: 2 reps;30 seconds;Limitations Active Hamstring Stretch Limitations: 3 way, standing 8 in box Hip Flexor Stretch: 2 reps;30 seconds Standing Extension: Limitations Standing Extension Limitations: corner stretch 3X30" Piriformis Stretch: 3 reps;30 seconds Piriformis Stretch Limitations: seated with LE crossed Standing Functional Squats: Limitations Functional Squats Limitations: 3D hip excursions 10 reps each; 3 reps squat reach matrix with 1#    Physical  Therapy Assessment and Plan PT Assessment and Plan Clinical Impression Statement: Reassessment complete with the following findings: Pt complinat with HEP and able to demonstrate appropriate form and technqiues wtih all stretches and exercies. Improved hip mobilty and improved strength. Improved core strength wtih no muscle energy technique for SI alignment required for several sessions now. Pt reports pain free PT Plan: D/C to HEP.    Goals PT Short Term Goals PT Short Term Goal 1: Patient will display increased hip internal roation to 40 degrees to increase shock absorbtion during transitioanl zone one of gait PT Short Term Goal 1 - Progress: Met PT Short Term Goal 2: Patient will increase hip extesnion to 15 degrees in improve rectos femoris/illiopsas flexibity resulting in a negative thomas test so patient can ambulate with increased stride length and decreased forward bending PT Short Term Goal 2 - Progress: Met PT Short Term Goal 3: Patient will increase anke dorsiflexion to 20 degrees to improve early heel off during gait PT Short Term Goal 3 - Progress: Met PT Short Term Goal 4: Patient will be able to ambulate >40 minutes with pain <3/10 PT Short Term Goal 4 - Progress: Met PT Short Term Goal 5: Patient will increase shoulder horizontal abduction to 30 degrees indicatign improved pectoralis mobility PT Short Term Goal 5 - Progress: Met PT Long Term Goals PT Long Term Goal 1: Patient will increased hip extension strength to 4+/5 MMT to be able to squat to the ground and lift 10lb off the floor 20x without low back pain PT Long Term Goal 1 - Progress: Met PT Long Term Goal 2: Patient will increase hip abduction strength to 4+/5 MMT to be able to stand on one leg >10 seconds withtou trendelenberg sign PT Long Term Goal 2 - Progress: Met Long Term Goal 3:  Patient will increased trunk flexion/extesnion strength to 5/5/ MMT to be able to stand/walk > 1 hour without pain >2/10 Long Term Goal  3 Progress: Met  Problem List Patient Active Problem List   Diagnosis Date Noted  . Pain in thoracic spine 12/26/2013  . Lumbago 12/26/2013  . Stiffness of joint, not elsewhere classified, pelvic region and thigh 12/26/2013  . Abnormality of gait 12/26/2013    PT - End of Session Activity Tolerance: Patient tolerated treatment well General Behavior During Therapy: [facility] for tasks assessed/performed  GP    Aldona Lento 01/21/2014, 4:49 PM  Devona Konig PT DPT  Physician Documentation Your signature is required to indicate approval of the treatment plan as stated above.  Please sign and either send electronically or make a copy of this report for your files and return this physician signed original.   Please mark one 1.__approve of plan  2. ___approve of plan with the following conditions.   ______________________________                                                          _____________________ Physician Signature                                                                                                             Date

## 2015-01-28 ENCOUNTER — Other Ambulatory Visit (HOSPITAL_COMMUNITY): Payer: Self-pay | Admitting: Family Medicine

## 2015-01-28 DIAGNOSIS — R945 Abnormal results of liver function studies: Secondary | ICD-10-CM

## 2015-02-02 ENCOUNTER — Ambulatory Visit (HOSPITAL_COMMUNITY)
Admission: RE | Admit: 2015-02-02 | Discharge: 2015-02-02 | Disposition: A | Discharge status: Home / Self Care | Payer: 59 | Source: Ambulatory Visit | Attending: Family Medicine | Admitting: Family Medicine

## 2015-02-02 DIAGNOSIS — R945 Abnormal results of liver function studies: Secondary | ICD-10-CM | POA: Diagnosis present

## 2015-02-02 DIAGNOSIS — K8020 Calculus of gallbladder without cholecystitis without obstruction: Secondary | ICD-10-CM | POA: Insufficient documentation

## 2015-02-18 NOTE — H&P (Signed)
NTS SOAP Note  Vital Signs:  Vitals as of: 02/17/2015: Systolic 159: Diastolic 120: Heart Rate 122: Temp 98.27F: Height 77ft 5in: Weight 184Lbs 0 Ounces: BMI 30.62  BMI : 30.62 kg/m2  Subjective: This 64 year old female presents for of elevated liver tests.  Has been present for some time, was found on u/s to have cholelithiasis, thickened wall, normal common bile duct.  Has not had biliary colic, nausea, vomiting, fatty food intolerance, jaundice.  Review of Symptoms:  Constitutional:unremarkable   Head:unremarkable Eyes:unremarkable   Nose/Mouth/Throat:unremarkable Cardiovascular:  unremarkable Respiratory:unremarkable Gastrointestinal:  unremarkable   Genitourinary:unremarkable   neck and back pain Skin:unremarkable Hematolgic/Lymphatic:unremarkable   Allergic/Immunologic:unremarkable   Past Medical History:  Reviewed  Past Medical History  Surgical History: BTL, TAH, thyroidectomy, right knee surgery Medical Problems: hypothyroidism, HTN, high cholesterol Allergies: keflex Medications: simvastatin, valsartan, synthroid, zantac, meloxicam, zyrtec, cyclobenzar   Social History:Reviewed  Social History  Preferred Language: English Race:  White Ethnicity: Not Hispanic / Latino Age: 8 year Marital Status:  M Alcohol: no   Smoking Status: Never smoker reviewed on 02/17/2015 Functional Status reviewed on 02/17/2015 ------------------------------------------------ Bathing: Normal Cooking: Normal Dressing: Normal Driving: Normal Eating: Normal Managing Meds: Normal Oral Care: Normal Shopping: Normal Toileting: Normal Transferring: Normal Walking: Normal Cognitive Status reviewed on 02/17/2015 ------------------------------------------------ Attention: Normal Decision Making: Normal Language: Normal Memory: Normal Motor: Normal Perception: Normal Problem Solving: Normal Visual and Spatial: Normal   Family History:Reviewed  Family  Health History Mother, Deceased; Leukemia;  Father, Deceased; Heart disease;     Objective Information: General:Well appearing, well nourished in no distress. Heart:RRR, no murmur Lungs:  CTA bilaterally, no wheezes, rhonchi, rales.  Breathing unlabored. Abdomen:Soft, NT/ND, no HSM, no masses.  Assessment:gallstones  Diagnoses: 574.20  K80.20 Gallstone (Calculus of gallbladder without cholecystitis without obstruction)  Procedures: 04540 - OFFICE OUTPATIENT NEW 30 MINUTES    Plan:  Scheduled for laparoscopic cholecystectomy on 03/02/15.   Patient Education:Alternative treatments to surgery were discussed with patient (and family).  Risks and benefits  of procedure including bleeding, infection, hepatobiliary injury, and the possibility of an open procedure were fully explained to the patient (and family) who gave informed consent. Patient/family questions were addressed.  Follow-up:Pending Surgery

## 2015-02-25 NOTE — Patient Instructions (Addendum)
Patricia [name]  02/25/2015     @PREFPERIOPPHARMACY @   Your procedure is scheduled on 03/02/2015.  Report to [facility] at 7:00 A.M.  Call this number if you have problems the morning of surgery:  [phone]   Remember:  Do not eat food or drink liquids after midnight.  Take these medicines the morning of surgery with A SIP OF WATER Diovan and synthroid   Do not wear jewelry, make-up or nail polish.  Do not wear lotions, powders, or perfumes.  You may wear deodorant.  Do not shave 48 hours prior to surgery.  Men may shave face and neck.  Do not bring valuables to the hospital.  [facility] is not responsible for any belongings or valuables.  Contacts, dentures or bridgework may not be worn into surgery.  Leave your suitcase in the car.  After surgery it may be brought to your room.  For patients admitted to the hospital, discharge time will be determined by your treatment team.  Patients discharged the day of surgery will not be allowed to drive home.    Please read over the following fact sheets that you were given. Surgical Site Infection Prevention and Anesthesia Post-op Instructions     PATIENT INSTRUCTIONS POST-ANESTHESIA  IMMEDIATELY FOLLOWING SURGERY:  Do not drive or operate machinery for the first twenty four hours after surgery.  Do not make any important decisions for twenty four hours after surgery or while taking narcotic pain medications or sedatives.  If you develop intractable nausea and vomiting or a severe headache please notify your doctor immediately.  FOLLOW-UP:  Please make an appointment with your surgeon as instructed. You do not need to follow up with anesthesia unless specifically instructed to do so.  WOUND CARE INSTRUCTIONS (if applicable):  Keep a dry clean dressing on the anesthesia/puncture wound site if there is drainage.  Once the wound has quit draining you may leave it open to air.  Generally you should leave the bandage intact for  twenty four hours unless there is drainage.  If the epidural site drains for more than 36-48 hours please call the anesthesia department.  QUESTIONS?:  Please feel free to call your physician or the hospital operator if you have any questions, and they will be happy to assist you.      Laparoscopic Cholecystectomy Laparoscopic cholecystectomy is surgery to remove the gallbladder. The gallbladder is located in the upper right part of the abdomen, behind the liver. It is a storage sac for bile produced in the liver. Bile aids in the digestion and absorption of fats. Cholecystectomy is often done for inflammation of the gallbladder (cholecystitis). This condition is usually caused by a buildup of gallstones (cholelithiasis) in your gallbladder. Gallstones can block the flow of bile, resulting in inflammation and pain. In severe cases, emergency surgery may be required. When emergency surgery is not required, you will have time to prepare for the procedure. Laparoscopic surgery is an alternative to open surgery. Laparoscopic surgery has a shorter recovery time. Your common bile duct may also need to be examined during the procedure. If stones are found in the common bile duct, they may be removed. LET [facility] CARE PROVIDER KNOW ABOUT:  Any allergies you have.  All medicines you are taking, including vitamins, herbs, eye drops, creams, and over-the-counter medicines.  Previous problems you or members of your family have had with the use of anesthetics.  Any blood disorders you have.  Previous surgeries you have had.  Medical conditions you have. RISKS AND COMPLICATIONS Generally, this is a safe procedure. However, as with any procedure, complications can occur. Possible complications include:  Infection.  Damage to the common bile duct, nerves, arteries, veins, or other internal organs such as the stomach, liver, or intestines.  Bleeding.  A stone may remain in the common bile duct.  A  bile leak from the cyst duct that is clipped when your gallbladder is removed.  The need to convert to open surgery, which requires a larger incision in the abdomen. This may be necessary if your surgeon thinks it is not safe to continue with a laparoscopic procedure. BEFORE THE PROCEDURE  Ask your health care provider about changing or stopping any regular medicines. You will need to stop taking aspirin or blood thinners at least 5 days prior to surgery.  Do not eat or drink anything after midnight the night before surgery.  Let your health care provider know if you develop a cold or other infectious problem before surgery. PROCEDURE   You will be given medicine to make you sleep through the procedure (general anesthetic). A breathing tube will be placed in your mouth.  When you are asleep, your surgeon will make several small cuts (incisions) in your abdomen.  A thin, lighted tube with a tiny camera on the end (laparoscope) is inserted through one of the small incisions. The camera on the laparoscope sends a picture to a TV screen in the operating room. This gives the surgeon a good view inside your abdomen.  A gas will be pumped into your abdomen. This expands your abdomen so that the surgeon has more room to perform the surgery.  Other tools needed for the procedure are inserted through the other incisions. The gallbladder is removed through one of the incisions.  After the removal of your gallbladder, the incisions will be closed with stitches, staples, or skin glue. AFTER THE PROCEDURE  You will be taken to a recovery area where your progress will be checked often.  You may be allowed to go home the same day if your pain is controlled and you can tolerate liquids. Document Released: 06/06/2005 Document Revised: 03/27/2013 Document Reviewed: 01/16/2013 [facility] Patient Information 2015 [place], [place]. This information is not intended to replace advice given to you by your health  care provider. Make sure you discuss any questions you have with your health care provider.

## 2015-02-26 ENCOUNTER — Other Ambulatory Visit: Payer: Self-pay

## 2015-02-26 ENCOUNTER — Encounter (HOSPITAL_COMMUNITY)
Admission: RE | Admit: 2015-02-26 | Discharge: 2015-02-26 | Disposition: A | Discharge status: Home / Self Care | Payer: 59 | Source: Ambulatory Visit | Attending: General Surgery | Admitting: General Surgery

## 2015-02-26 ENCOUNTER — Encounter (HOSPITAL_COMMUNITY): Payer: Self-pay

## 2015-02-26 DIAGNOSIS — Z01812 Encounter for preprocedural laboratory examination: Secondary | ICD-10-CM | POA: Diagnosis present

## 2015-02-26 DIAGNOSIS — Z01810 Encounter for preprocedural cardiovascular examination: Secondary | ICD-10-CM | POA: Diagnosis present

## 2015-02-26 HISTORY — DX: Other specified postprocedural states: R11.2

## 2015-02-26 HISTORY — DX: Other specified postprocedural states: Z98.890

## 2015-02-26 HISTORY — DX: Essential (primary) hypertension: I10

## 2015-02-26 LAB — HEPATIC FUNCTION PANEL
ALBUMIN: 4.2 g/dL (ref 3.5–5.0)
ALK PHOS: 75 U/L (ref 38–126)
ALT: 66 U/L — AB (ref 14–54)
AST: 64 U/L — ABNORMAL HIGH (ref 15–41)
Bilirubin, Direct: 0.1 mg/dL (ref 0.1–0.5)
Indirect Bilirubin: 0.6 mg/dL (ref 0.3–0.9)
TOTAL PROTEIN: 7.5 g/dL (ref 6.5–8.1)
Total Bilirubin: 0.7 mg/dL (ref 0.3–1.2)

## 2015-02-26 LAB — BASIC METABOLIC PANEL
Anion gap: 6 (ref 5–15)
BUN: 18 mg/dL (ref 6–20)
CHLORIDE: 107 mmol/L (ref 101–111)
CO2: 28 mmol/L (ref 22–32)
Calcium: 9.1 mg/dL (ref 8.9–10.3)
Creatinine, Ser: 0.77 mg/dL (ref 0.44–1.00)
GFR calc Af Amer: >60 mL/min (ref >60)
GFR calc non Af Amer: >60 mL/min (ref >60)
GLUCOSE: 118 mg/dL — AB (ref 65–99)
POTASSIUM: 4.2 mmol/L (ref 3.5–5.1)
SODIUM: 141 mmol/L (ref 135–145)

## 2015-02-26 LAB — CBC WITH DIFFERENTIAL/PLATELET
BASOS ABS: 0.1 10*3/uL (ref 0.0–0.1)
BASOS PCT: 1 % (ref 0–1)
EOS ABS: 0.3 10*3/uL (ref 0.0–0.7)
EOS PCT: 3 % (ref 0–5)
HCT: 41.5 % (ref 36.0–46.0)
HEMOGLOBIN: 13.8 g/dL (ref 12.0–15.0)
LYMPHS ABS: 4.6 10*3/uL — AB (ref 0.7–4.0)
LYMPHS PCT: 43 % (ref 12–46)
MCH: 29.7 pg (ref 26.0–34.0)
MCHC: 33.3 g/dL (ref 30.0–36.0)
MCV: 89.2 fL (ref 78.0–100.0)
Monocytes Absolute: 1 10*3/uL (ref 0.1–1.0)
Monocytes Relative: 9 % (ref 3–12)
NEUTROS ABS: 4.8 10*3/uL (ref 1.7–7.7)
Neutrophils Relative %: 44 % (ref 43–77)
Platelets: 347 10*3/uL (ref 150–400)
RBC: 4.65 MIL/uL (ref 3.87–5.11)
RDW: 14 % (ref 11.5–15.5)
WBC: 10.7 10*3/uL — AB (ref 4.0–10.5)

## 2015-02-27 NOTE — Pre-Procedure Instructions (Deleted)
Message left for patient to not take Diovan day of surgery

## 2015-03-02 ENCOUNTER — Ambulatory Visit (HOSPITAL_COMMUNITY)
Admission: RE | Admit: 2015-03-02 | Discharge: 2015-03-02 | Disposition: A | Discharge status: Home / Self Care | Payer: 59 | Source: Ambulatory Visit | Attending: General Surgery | Admitting: General Surgery

## 2015-03-02 ENCOUNTER — Ambulatory Visit (HOSPITAL_COMMUNITY): Payer: 59 | Admitting: Anesthesiology

## 2015-03-02 ENCOUNTER — Encounter (HOSPITAL_COMMUNITY): Payer: Self-pay | Admitting: *Deleted

## 2015-03-02 ENCOUNTER — Encounter (HOSPITAL_COMMUNITY)
Admission: RE | Disposition: A | Discharge status: Home / Self Care | Payer: Self-pay | Source: Ambulatory Visit | Attending: General Surgery

## 2015-03-02 DIAGNOSIS — K8010 Calculus of gallbladder with chronic cholecystitis without obstruction: Secondary | ICD-10-CM | POA: Diagnosis not present

## 2015-03-02 DIAGNOSIS — K760 Fatty (change of) liver, not elsewhere classified: Secondary | ICD-10-CM | POA: Diagnosis not present

## 2015-03-02 DIAGNOSIS — Z79899 Other long term (current) drug therapy: Secondary | ICD-10-CM | POA: Diagnosis not present

## 2015-03-02 DIAGNOSIS — Z7982 Long term (current) use of aspirin: Secondary | ICD-10-CM | POA: Diagnosis not present

## 2015-03-02 DIAGNOSIS — Z791 Long term (current) use of non-steroidal anti-inflammatories (NSAID): Secondary | ICD-10-CM | POA: Diagnosis not present

## 2015-03-02 DIAGNOSIS — I10 Essential (primary) hypertension: Secondary | ICD-10-CM | POA: Diagnosis not present

## 2015-03-02 DIAGNOSIS — R748 Abnormal levels of other serum enzymes: Secondary | ICD-10-CM | POA: Diagnosis not present

## 2015-03-02 DIAGNOSIS — E039 Hypothyroidism, unspecified: Secondary | ICD-10-CM | POA: Insufficient documentation

## 2015-03-02 DIAGNOSIS — K824 Cholesterolosis of gallbladder: Secondary | ICD-10-CM | POA: Insufficient documentation

## 2015-03-02 DIAGNOSIS — K219 Gastro-esophageal reflux disease without esophagitis: Secondary | ICD-10-CM | POA: Insufficient documentation

## 2015-03-02 DIAGNOSIS — K8020 Calculus of gallbladder without cholecystitis without obstruction: Secondary | ICD-10-CM | POA: Diagnosis present

## 2015-03-02 HISTORY — PX: CHOLECYSTECTOMY: SHX55

## 2015-03-02 HISTORY — PX: LIVER BIOPSY: SHX301

## 2015-03-02 SURGERY — LAPAROSCOPIC CHOLECYSTECTOMY
Anesthesia: General

## 2015-03-02 MED ORDER — BUPIVACAINE HCL (PF) 0.5 % IJ SOLN
INTRAMUSCULAR | Status: AC
  Filled 2015-03-02: qty 30

## 2015-03-02 MED ORDER — HEMOSTATIC AGENTS (NO CHARGE) OPTIME
TOPICAL | Status: DC | PRN
  Administered 2015-03-02: 1 via TOPICAL

## 2015-03-02 MED ORDER — LIDOCAINE HCL (PF) 1 % IJ SOLN
INTRAMUSCULAR | Status: AC
  Filled 2015-03-02: qty 5

## 2015-03-02 MED ORDER — METOPROLOL TARTRATE 1 MG/ML IV SOLN
INTRAVENOUS | Status: AC
  Filled 2015-03-02: qty 5

## 2015-03-02 MED ORDER — FENTANYL CITRATE (PF) 250 MCG/5ML IJ SOLN
INTRAMUSCULAR | Status: AC
  Filled 2015-03-02: qty 25

## 2015-03-02 MED ORDER — FENTANYL CITRATE (PF) 100 MCG/2ML IJ SOLN
25.0000 ug | INTRAMUSCULAR | Status: AC
  Administered 2015-03-02 (×2): 25 ug via INTRAVENOUS

## 2015-03-02 MED ORDER — DEXAMETHASONE SODIUM PHOSPHATE 4 MG/ML IJ SOLN
4.0000 mg | Freq: Once | INTRAMUSCULAR | Status: AC
  Administered 2015-03-02: 4 mg via INTRAVENOUS

## 2015-03-02 MED ORDER — ROCURONIUM BROMIDE 100 MG/10ML IV SOLN
INTRAVENOUS | Status: DC | PRN
  Administered 2015-03-02: 25 mg via INTRAVENOUS

## 2015-03-02 MED ORDER — LACTATED RINGERS IV SOLN
INTRAVENOUS | Status: DC
  Administered 2015-03-02 (×2): via INTRAVENOUS

## 2015-03-02 MED ORDER — MIDAZOLAM HCL 2 MG/2ML IJ SOLN
1.0000 mg | INTRAMUSCULAR | Status: DC | PRN
  Administered 2015-03-02 (×2): 2 mg via INTRAVENOUS
  Filled 2015-03-02: qty 2

## 2015-03-02 MED ORDER — SUCCINYLCHOLINE CHLORIDE 20 MG/ML IJ SOLN
INTRAMUSCULAR | Status: AC
  Filled 2015-03-02: qty 1

## 2015-03-02 MED ORDER — MIDAZOLAM HCL 2 MG/2ML IJ SOLN
INTRAMUSCULAR | Status: AC
  Filled 2015-03-02: qty 2

## 2015-03-02 MED ORDER — PROPOFOL 10 MG/ML IV BOLUS
INTRAVENOUS | Status: AC
  Filled 2015-03-02: qty 20

## 2015-03-02 MED ORDER — NEOSTIGMINE METHYLSULFATE 10 MG/10ML IV SOLN
INTRAVENOUS | Status: DC | PRN
  Administered 2015-03-02: 4 mg via INTRAVENOUS

## 2015-03-02 MED ORDER — SUCCINYLCHOLINE CHLORIDE 20 MG/ML IJ SOLN
INTRAMUSCULAR | Status: DC | PRN
  Administered 2015-03-02: 120 mg via INTRAVENOUS

## 2015-03-02 MED ORDER — NEOSTIGMINE METHYLSULFATE 10 MG/10ML IV SOLN
INTRAVENOUS | Status: AC
  Filled 2015-03-02: qty 1

## 2015-03-02 MED ORDER — GLYCOPYRROLATE 0.2 MG/ML IJ SOLN
INTRAMUSCULAR | Status: DC | PRN
  Administered 2015-03-02: 0.6 mg via INTRAVENOUS

## 2015-03-02 MED ORDER — PHENYLEPHRINE 40 MCG/ML (10ML) SYRINGE FOR IV PUSH (FOR BLOOD PRESSURE SUPPORT)
PREFILLED_SYRINGE | INTRAVENOUS | Status: AC
  Filled 2015-03-02: qty 10

## 2015-03-02 MED ORDER — DEXAMETHASONE SODIUM PHOSPHATE 4 MG/ML IJ SOLN
INTRAMUSCULAR | Status: DC | PRN
  Administered 2015-03-02: 8 mg via INTRAVENOUS

## 2015-03-02 MED ORDER — EPHEDRINE SULFATE 50 MG/ML IJ SOLN
INTRAMUSCULAR | Status: AC
  Filled 2015-03-02: qty 1

## 2015-03-02 MED ORDER — METOPROLOL TARTRATE 1 MG/ML IV SOLN
INTRAVENOUS | Status: DC | PRN
  Administered 2015-03-02 (×2): 1 mg via INTRAVENOUS

## 2015-03-02 MED ORDER — ONDANSETRON HCL 4 MG/2ML IJ SOLN
INTRAMUSCULAR | Status: AC
  Filled 2015-03-02: qty 2

## 2015-03-02 MED ORDER — LIDOCAINE HCL (CARDIAC) 20 MG/ML IV SOLN
INTRAVENOUS | Status: DC | PRN
  Administered 2015-03-02: 50 mg via INTRAVENOUS

## 2015-03-02 MED ORDER — DEXAMETHASONE SODIUM PHOSPHATE 4 MG/ML IJ SOLN
INTRAMUSCULAR | Status: AC
  Filled 2015-03-02: qty 2

## 2015-03-02 MED ORDER — PROPOFOL 10 MG/ML IV BOLUS
INTRAVENOUS | Status: DC | PRN
  Administered 2015-03-02: 180 mg via INTRAVENOUS

## 2015-03-02 MED ORDER — DEXAMETHASONE SODIUM PHOSPHATE 4 MG/ML IJ SOLN
INTRAMUSCULAR | Status: AC
  Filled 2015-03-02: qty 1

## 2015-03-02 MED ORDER — ROCURONIUM BROMIDE 50 MG/5ML IV SOLN
INTRAVENOUS | Status: AC
  Filled 2015-03-02: qty 1

## 2015-03-02 MED ORDER — CIPROFLOXACIN IN D5W 400 MG/200ML IV SOLN
400.0000 mg | INTRAVENOUS | Status: AC
  Administered 2015-03-02: 400 mg via INTRAVENOUS
  Filled 2015-03-02: qty 200

## 2015-03-02 MED ORDER — KETOROLAC TROMETHAMINE 30 MG/ML IJ SOLN
30.0000 mg | Freq: Once | INTRAMUSCULAR | Status: AC
  Administered 2015-03-02: 30 mg via INTRAVENOUS
  Filled 2015-03-02: qty 1

## 2015-03-02 MED ORDER — GLYCOPYRROLATE 0.2 MG/ML IJ SOLN
INTRAMUSCULAR | Status: AC
  Filled 2015-03-02: qty 3

## 2015-03-02 MED ORDER — METOPROLOL TARTRATE 1 MG/ML IV SOLN
5.0000 mg | Freq: Once | INTRAVENOUS | Status: AC
  Administered 2015-03-02: 3 mg via INTRAVENOUS

## 2015-03-02 MED ORDER — SCOPOLAMINE 1 MG/3DAYS TD PT72
MEDICATED_PATCH | TRANSDERMAL | Status: AC
  Filled 2015-03-02: qty 1

## 2015-03-02 MED ORDER — POVIDONE-IODINE 10 % EX OINT
TOPICAL_OINTMENT | CUTANEOUS | Status: AC
  Filled 2015-03-02: qty 1

## 2015-03-02 MED ORDER — GLYCOPYRROLATE 0.2 MG/ML IJ SOLN
INTRAMUSCULAR | Status: AC
  Filled 2015-03-02: qty 1

## 2015-03-02 MED ORDER — ARTIFICIAL TEARS OP OINT
TOPICAL_OINTMENT | OPHTHALMIC | Status: AC
  Filled 2015-03-02: qty 7

## 2015-03-02 MED ORDER — ONDANSETRON HCL 4 MG/2ML IJ SOLN
4.0000 mg | Freq: Once | INTRAMUSCULAR | Status: AC
  Administered 2015-03-02: 4 mg via INTRAVENOUS

## 2015-03-02 MED ORDER — OXYCODONE-ACETAMINOPHEN 7.5-325 MG PO TABS: 1.0000 | ORAL_TABLET | ORAL | Status: DC | PRN

## 2015-03-02 MED ORDER — FENTANYL CITRATE (PF) 100 MCG/2ML IJ SOLN
25.0000 ug | INTRAMUSCULAR | Status: DC | PRN
  Filled 2015-03-02: qty 2

## 2015-03-02 MED ORDER — FENTANYL CITRATE (PF) 100 MCG/2ML IJ SOLN
INTRAMUSCULAR | Status: DC | PRN
  Administered 2015-03-02 (×5): 50 ug via INTRAVENOUS

## 2015-03-02 MED ORDER — ONDANSETRON HCL 4 MG/2ML IJ SOLN: 4.0000 mg | Freq: Once | INTRAMUSCULAR | Status: DC | PRN

## 2015-03-02 MED ORDER — BUPIVACAINE HCL (PF) 0.5 % IJ SOLN
INTRAMUSCULAR | Status: DC | PRN
  Administered 2015-03-02: 10 mL

## 2015-03-02 MED ORDER — POVIDONE-IODINE 10 % OINT PACKET
TOPICAL_OINTMENT | CUTANEOUS | Status: DC | PRN
  Administered 2015-03-02: 1 via TOPICAL

## 2015-03-02 MED ORDER — SODIUM CHLORIDE 0.9 % IR SOLN
Status: DC | PRN
  Administered 2015-03-02: 1000 mL

## 2015-03-02 MED ORDER — CHLORHEXIDINE GLUCONATE 4 % EX LIQD: 1.0000 "application " | Freq: Once | CUTANEOUS | Status: DC

## 2015-03-02 MED ORDER — SODIUM CHLORIDE 0.9 % IJ SOLN
INTRAMUSCULAR | Status: AC
Start: 2015-03-02 — End: 2015-03-02
  Filled 2015-03-02: qty 10

## 2015-03-02 MED ORDER — GLYCOPYRROLATE 0.2 MG/ML IJ SOLN
0.2000 mg | Freq: Once | INTRAMUSCULAR | Status: AC
  Administered 2015-03-02: 0.2 mg via INTRAVENOUS

## 2015-03-02 MED ORDER — SCOPOLAMINE 1 MG/3DAYS TD PT72
1.0000 | MEDICATED_PATCH | Freq: Once | TRANSDERMAL | Status: DC
  Administered 2015-03-02: 1.5 mg via TRANSDERMAL

## 2015-03-02 SURGICAL SUPPLY — 45 items
APPLIER CLIP LAPSCP 10X32 DD (CLIP) ×3 IMPLANT
BAG HAMPER (MISCELLANEOUS) ×3 IMPLANT
BAG SPEC RTRVL LRG 6X4 10 (ENDOMECHANICALS) ×2
CHLORAPREP W/TINT 26ML (MISCELLANEOUS) ×3 IMPLANT
CLOTH BEACON ORANGE TIMEOUT ST (SAFETY) ×3 IMPLANT
COVER LIGHT HANDLE STERIS (MISCELLANEOUS) ×6 IMPLANT
DECANTER SPIKE VIAL GLASS SM (MISCELLANEOUS) ×3 IMPLANT
ELECT REM PT RETURN 9FT ADLT (ELECTROSURGICAL) ×3
ELECTRODE REM PT RTRN 9FT ADLT (ELECTROSURGICAL) ×2 IMPLANT
FILTER SMOKE EVAC LAPAROSHD (FILTER) ×3 IMPLANT
FORMALIN 10 PREFIL 120ML (MISCELLANEOUS) ×4 IMPLANT
GLOVE BIO SURGEON STRL SZ7 (GLOVE) ×1 IMPLANT
GLOVE BIOGEL M 7.0 STRL (GLOVE) ×2 IMPLANT
GLOVE BIOGEL PI IND STRL 7.0 (GLOVE) IMPLANT
GLOVE BIOGEL PI INDICATOR 7.0 (GLOVE) ×3
GLOVE EXAM NITRILE MD LF STRL (GLOVE) ×1 IMPLANT
GLOVE SURG SS PI 7.5 STRL IVOR (GLOVE) ×3 IMPLANT
GOWN STRL REUS W/ TWL XL LVL3 (GOWN DISPOSABLE) ×2 IMPLANT
GOWN STRL REUS W/TWL LRG LVL3 (GOWN DISPOSABLE) ×7 IMPLANT
GOWN STRL REUS W/TWL XL LVL3 (GOWN DISPOSABLE) ×3
HEMOSTAT SNOW SURGICEL 2X4 (HEMOSTASIS) ×3 IMPLANT
INST SET LAPROSCOPIC AP (KITS) ×3 IMPLANT
KIT ROOM TURNOVER APOR (KITS) ×3 IMPLANT
MANIFOLD NEPTUNE II (INSTRUMENTS) ×3 IMPLANT
NDL BIOPSY 14X6 SOFT TISS (NEEDLE) IMPLANT
NDL INSUFFLATION 14GA 120MM (NEEDLE) ×2 IMPLANT
NEEDLE BIOPSY 14X6 SOFT TISS (NEEDLE) ×3 IMPLANT
NEEDLE INSUFFLATION 14GA 120MM (NEEDLE) ×3 IMPLANT
NS IRRIG 1000ML POUR BTL (IV SOLUTION) ×3 IMPLANT
PACK LAP CHOLE LZT030E (CUSTOM PROCEDURE TRAY) ×3 IMPLANT
PAD ARMBOARD 7.5X6 YLW CONV (MISCELLANEOUS) ×3 IMPLANT
PAD TELFA 3X4 1S STER (GAUZE/BANDAGES/DRESSINGS) ×1 IMPLANT
POUCH SPECIMEN RETRIEVAL 10MM (ENDOMECHANICALS) ×3 IMPLANT
SET BASIN LINEN APH (SET/KITS/TRAYS/PACK) ×3 IMPLANT
SLEEVE ENDOPATH XCEL 5M (ENDOMECHANICALS) ×3 IMPLANT
SPONGE GAUZE 2X2 8PLY STRL LF (GAUZE/BANDAGES/DRESSINGS) ×12 IMPLANT
STAPLER VISISTAT (STAPLE) ×3 IMPLANT
SUT VICRYL 0 UR6 27IN ABS (SUTURE) ×3 IMPLANT
TAPE CLOTH SURG 4X10 WHT LF (GAUZE/BANDAGES/DRESSINGS) ×1 IMPLANT
TROCAR ENDO BLADELESS 11MM (ENDOMECHANICALS) ×3 IMPLANT
TROCAR XCEL NON-BLD 5MMX100MML (ENDOMECHANICALS) ×3 IMPLANT
TROCAR XCEL UNIV SLVE 11M 100M (ENDOMECHANICALS) ×3 IMPLANT
TUBING INSUFFLATION (TUBING) ×3 IMPLANT
WARMER LAPAROSCOPE (MISCELLANEOUS) ×3 IMPLANT
YANKAUER SUCT 12FT TUBE ARGYLE (SUCTIONS) ×3 IMPLANT

## 2015-03-02 NOTE — Discharge Instructions (Signed)

## 2015-03-02 NOTE — Anesthesia Procedure Notes (Signed)
Procedure Name: Intubation Date/Time: 03/02/2015 8:41 AM Performed by: Pernell Dupre, Benicia Bergevin A Pre-anesthesia Checklist: Patient identified, Patient being monitored, Timeout performed, Emergency Drugs available and Suction available Patient Re-evaluated:Patient Re-evaluated prior to inductionOxygen Delivery Method: Circle System Utilized Preoxygenation: Pre-oxygenation with 100% oxygen Intubation Type: IV induction Ventilation: Mask ventilation without difficulty Laryngoscope Size: 3 and Miller Grade View: Grade I Tube type: Oral Tube size: 7.0 mm Number of attempts: 1 Airway Equipment and Method: Stylet Placement Confirmation: ETT inserted through vocal cords under direct vision,  positive ETCO2 and breath sounds checked- equal and bilateral Secured at: 21 cm Tube secured with: Tape Dental Injury: Teeth and Oropharynx as per pre-operative assessment

## 2015-03-02 NOTE — Anesthesia Postprocedure Evaluation (Signed)
  Anesthesia Post-op Note  Patient: Patricia [name]  Procedure(s) Performed: Procedure(s): LAPAROSCOPIC CHOLECYSTECTOMY (N/A) LAPAROSCOPIC LIVER BIOPSY  Patient Location: PACU  Anesthesia Type:General  Level of Consciousness: awake, alert , oriented and patient cooperative  Airway and Oxygen Therapy: Patient Spontanous Breathing and Patient connected to face mask oxygen  Post-op Pain: mild  Post-op Assessment: Post-op Vital signs reviewed, Patient's Cardiovascular Status Stable, Respiratory Function Stable, Patent Airway, No signs of Nausea or vomiting and Pain level controlled              Post-op Vital Signs: Reviewed and stable  Last Vitals:  Filed Vitals:   03/02/15 0930  BP: 154/79  Pulse:   Temp: 36.7 C  Resp: 21    Complications: No apparent anesthesia complications

## 2015-03-02 NOTE — Interval H&P Note (Signed)
History and Physical Interval Note:  03/02/2015 8:15 AM  Patricia [name]  has presented today for surgery, with the diagnosis of cholelithiasis  The various methods of treatment have been discussed with the patient and family. After consideration of risks, benefits and other options for treatment, the patient has consented to  Procedure(s): LAPAROSCOPIC CHOLECYSTECTOMY (N/A) as a surgical intervention .  The patient's history has been reviewed, patient examined, no change in status, stable for surgery.  I have reviewed the patient's chart and labs.  Questions were answered to the patient's satisfaction.     Franky Macho A

## 2015-03-02 NOTE — Transfer of Care (Signed)
Immediate Anesthesia Transfer of Care Note  Patient: Patricia [name]  Procedure(s) Performed: Procedure(s): LAPAROSCOPIC CHOLECYSTECTOMY (N/A) LAPAROSCOPIC LIVER BIOPSY  Patient Location: PACU  Anesthesia Type:General  Level of Consciousness: awake, alert , oriented and patient cooperative  Airway & Oxygen Therapy: Patient Spontanous Breathing and Patient connected to face mask oxygen  Post-op Assessment: Report given to RN and Post -op Vital signs reviewed and stable  Post vital signs: Reviewed and stable  Last Vitals:  Filed Vitals:   03/02/15 0826  BP: 150/94  Pulse:   Temp:   Resp: 17    Complications: No apparent anesthesia complications

## 2015-03-02 NOTE — Anesthesia Preprocedure Evaluation (Signed)
Anesthesia Evaluation  Patient identified by MRN, date of birth, ID band Patient awake    Reviewed: Allergy & Precautions, NPO status , Patient's Chart, lab work & pertinent test results  History of Anesthesia Complications (+) PONV and history of anesthetic complications  Airway Mallampati: I  TM Distance: >3 FB     Dental  (+) Teeth Intact, Dental Advisory Given   Pulmonary neg pulmonary ROS,    breath sounds clear to auscultation       Cardiovascular hypertension, Pt. on medications  Rhythm:Regular Rate:Normal     Neuro/Psych    GI/Hepatic GERD  Medicated and Controlled,  Endo/Other  Hypothyroidism   Renal/GU      Musculoskeletal   Abdominal   Peds  Hematology   Anesthesia Other Findings   Reproductive/Obstetrics                             Anesthesia Physical Anesthesia Plan  ASA: II  Anesthesia Plan: General   Post-op Pain Management:    Induction: Intravenous  Airway Management Planned: Oral ETT  Additional Equipment:   Intra-op Plan:   Post-operative Plan: Extubation in OR  Informed Consent: I have reviewed the patients History and Physical, chart, labs and discussed the procedure including the risks, benefits and alternatives for the proposed anesthesia with the patient or authorized representative who has indicated his/her understanding and acceptance.     Plan Discussed with:   Anesthesia Plan Comments:         Anesthesia Quick Evaluation

## 2015-03-02 NOTE — Progress Notes (Addendum)
Patient continues to have elevated heart rate of 112 and elevated BP after 2 doses of versed. Patient continues to be anxious. Discussed with Dr. Marcos Eke will give 2 doses of Fentanyl.

## 2015-03-02 NOTE — Op Note (Signed)
Patient:  Patricia [name]  DOB:  [birth year]  MRN:  [id number]   Preop Diagnosis:  Elevated liver enzyme tests, cholelithiasis  Postop Diagnosis:  Same  Procedure:   Laparoscopic cholecystectomy, liver biopsy  Surgeon:  Franky Macho, M.D.  Anes:  Gen. endotracheal  Indications:  Patient is a 64 year old white female with known history of gallstones who also was noted to have elevated liver enzyme tests. She now presents for laparoscopic cholecystectomy. The risks and benefits of the procedure including bleeding, infection, hepatobiliary injury, and the possibility of an open procedure were fully explained to the patient, who gave informed consent.  Procedure note:  The patient was placed the supine position. After induction of general endotracheal anesthesia, the abdomen was prepped and draped using the usual sterile technique with DuraPrep. Surgical site confirmation was performed.  A supraumbilical incision was made down to the fascia. A Veress needle was introduced into the abdominal cavity and confirmation of placement was done using the saline drop test. The abdomen was then insufflated to 16 mmHg pressure. An 11 mm trocar was introduced into the abdominal cavity under direct visualization without difficulty. Patient was placed in reverse Trendelenburg position and additional 11 mm trocar was placed the epigastric region and 5 mm trochars were placed the right upper quadrant and right flank regions. The liver was inspected and appeared to be within normal limits. A Tru-Cut needle core biopsy of the right lobe liver was performed and sent to pathology further examination. The puncture site was cauterized using Bovie electrocautery. The gallbladder was retracted in a dynamic fashion in order to expose the triangle of Calot. The cystic duct was first identified. Its junction to the infundibulum was fully identified. Endoclips were placed proximally and distally on the cystic duct, and the cystic  duct was divided. This was likewise done cystic artery. The gallbladder was freed away from the gallbladder fossa using Bovie electrocautery. The gallbladder was delivered through the epigastric trocar site using an Endo Catch bag. The gallbladder fossa was inspected no abnormal bleeding or bile leakage was noted. Surgicel was placed in the gallbladder fossa. All fluid and air were then evacuated from the abdominal cavity prior to removal of the trochars.  All wounds were irrigated with normal saline. All wounds were injected with 0.5% Sensorcaine. The supraumbilical fascia was reapproximated using 0 Vicryl interrupted suture. All skin incisions were closed using staples. Betadine ointment and dry sterile dressings were applied.  All tape and needle counts were correct at the end of the procedure. The patient was extubated in the operating room and transferred to PACU in stable condition.  Complications:  None  EBL:  Minimal  Specimen:  Gallbladder, liver biopsy

## 2015-03-03 ENCOUNTER — Encounter (HOSPITAL_COMMUNITY): Payer: Self-pay | Admitting: General Surgery

## 2016-03-03 DIAGNOSIS — Z23 Encounter for immunization: Secondary | ICD-10-CM | POA: Diagnosis not present

## 2016-05-30 DIAGNOSIS — R69 Illness, unspecified: Secondary | ICD-10-CM | POA: Diagnosis not present

## 2016-06-29 DIAGNOSIS — M533 Sacrococcygeal disorders, not elsewhere classified: Secondary | ICD-10-CM | POA: Diagnosis not present

## 2016-07-04 DIAGNOSIS — E782 Mixed hyperlipidemia: Secondary | ICD-10-CM | POA: Diagnosis not present

## 2016-07-04 DIAGNOSIS — E890 Postprocedural hypothyroidism: Secondary | ICD-10-CM | POA: Diagnosis not present

## 2016-07-04 DIAGNOSIS — E663 Overweight: Secondary | ICD-10-CM | POA: Diagnosis not present

## 2016-07-04 DIAGNOSIS — I10 Essential (primary) hypertension: Secondary | ICD-10-CM | POA: Diagnosis not present

## 2016-07-04 DIAGNOSIS — Z6829 Body mass index (BMI) 29.0-29.9, adult: Secondary | ICD-10-CM | POA: Diagnosis not present

## 2016-07-04 DIAGNOSIS — Z1389 Encounter for screening for other disorder: Secondary | ICD-10-CM | POA: Diagnosis not present

## 2016-07-04 DIAGNOSIS — R7309 Other abnormal glucose: Secondary | ICD-10-CM | POA: Diagnosis not present

## 2016-07-12 DIAGNOSIS — M533 Sacrococcygeal disorders, not elsewhere classified: Secondary | ICD-10-CM | POA: Diagnosis not present

## 2016-07-25 DIAGNOSIS — M533 Sacrococcygeal disorders, not elsewhere classified: Secondary | ICD-10-CM | POA: Diagnosis not present

## 2016-12-12 DIAGNOSIS — R69 Illness, unspecified: Secondary | ICD-10-CM | POA: Diagnosis not present

## 2017-01-09 DIAGNOSIS — Z0001 Encounter for general adult medical examination with abnormal findings: Secondary | ICD-10-CM | POA: Diagnosis not present

## 2017-01-09 DIAGNOSIS — Z1389 Encounter for screening for other disorder: Secondary | ICD-10-CM | POA: Diagnosis not present

## 2017-01-09 DIAGNOSIS — G894 Chronic pain syndrome: Secondary | ICD-10-CM | POA: Diagnosis not present

## 2017-01-09 DIAGNOSIS — I10 Essential (primary) hypertension: Secondary | ICD-10-CM | POA: Diagnosis not present

## 2017-01-09 DIAGNOSIS — E782 Mixed hyperlipidemia: Secondary | ICD-10-CM | POA: Diagnosis not present

## 2017-01-09 DIAGNOSIS — E039 Hypothyroidism, unspecified: Secondary | ICD-10-CM | POA: Diagnosis not present

## 2017-01-09 DIAGNOSIS — Z6829 Body mass index (BMI) 29.0-29.9, adult: Secondary | ICD-10-CM | POA: Diagnosis not present

## 2017-01-09 DIAGNOSIS — E890 Postprocedural hypothyroidism: Secondary | ICD-10-CM | POA: Diagnosis not present

## 2017-01-09 DIAGNOSIS — Z23 Encounter for immunization: Secondary | ICD-10-CM | POA: Diagnosis not present

## 2017-01-09 DIAGNOSIS — R7309 Other abnormal glucose: Secondary | ICD-10-CM | POA: Diagnosis not present

## 2017-01-10 ENCOUNTER — Other Ambulatory Visit (HOSPITAL_COMMUNITY): Payer: Self-pay | Admitting: Family Medicine

## 2017-01-10 DIAGNOSIS — Z1231 Encounter for screening mammogram for malignant neoplasm of breast: Secondary | ICD-10-CM

## 2017-01-12 DIAGNOSIS — Z1211 Encounter for screening for malignant neoplasm of colon: Secondary | ICD-10-CM | POA: Diagnosis not present

## 2017-01-13 ENCOUNTER — Ambulatory Visit (HOSPITAL_COMMUNITY)
Admission: RE | Admit: 2017-01-13 | Discharge: 2017-01-13 | Disposition: A | Discharge status: Home / Self Care | Payer: Medicare HMO | Source: Ambulatory Visit | Attending: Family Medicine | Admitting: Family Medicine

## 2017-01-13 DIAGNOSIS — Z1231 Encounter for screening mammogram for malignant neoplasm of breast: Secondary | ICD-10-CM | POA: Diagnosis not present

## 2017-03-16 DIAGNOSIS — Z23 Encounter for immunization: Secondary | ICD-10-CM | POA: Diagnosis not present

## 2017-07-13 DIAGNOSIS — R69 Illness, unspecified: Secondary | ICD-10-CM | POA: Diagnosis not present

## 2017-07-17 DIAGNOSIS — E890 Postprocedural hypothyroidism: Secondary | ICD-10-CM | POA: Diagnosis not present

## 2017-07-17 DIAGNOSIS — E663 Overweight: Secondary | ICD-10-CM | POA: Diagnosis not present

## 2017-07-17 DIAGNOSIS — Z6829 Body mass index (BMI) 29.0-29.9, adult: Secondary | ICD-10-CM | POA: Diagnosis not present

## 2017-07-17 DIAGNOSIS — R251 Tremor, unspecified: Secondary | ICD-10-CM | POA: Diagnosis not present

## 2017-07-17 DIAGNOSIS — R7309 Other abnormal glucose: Secondary | ICD-10-CM | POA: Diagnosis not present

## 2017-07-17 DIAGNOSIS — E039 Hypothyroidism, unspecified: Secondary | ICD-10-CM | POA: Diagnosis not present

## 2017-07-17 DIAGNOSIS — E782 Mixed hyperlipidemia: Secondary | ICD-10-CM | POA: Diagnosis not present

## 2017-07-17 DIAGNOSIS — Z1389 Encounter for screening for other disorder: Secondary | ICD-10-CM | POA: Diagnosis not present

## 2017-08-16 DIAGNOSIS — E6609 Other obesity due to excess calories: Secondary | ICD-10-CM | POA: Diagnosis not present

## 2017-08-16 DIAGNOSIS — Z888 Allergy status to other drugs, medicaments and biological substances status: Secondary | ICD-10-CM | POA: Diagnosis not present

## 2017-08-16 DIAGNOSIS — Z6830 Body mass index (BMI) 30.0-30.9, adult: Secondary | ICD-10-CM | POA: Diagnosis not present

## 2017-08-28 DIAGNOSIS — K7689 Other specified diseases of liver: Secondary | ICD-10-CM | POA: Diagnosis not present

## 2017-08-28 DIAGNOSIS — E890 Postprocedural hypothyroidism: Secondary | ICD-10-CM | POA: Diagnosis not present

## 2017-08-31 DIAGNOSIS — M533 Sacrococcygeal disorders, not elsewhere classified: Secondary | ICD-10-CM | POA: Diagnosis not present

## 2017-09-15 ENCOUNTER — Other Ambulatory Visit: Payer: Self-pay | Admitting: Physical Medicine and Rehabilitation

## 2017-09-15 DIAGNOSIS — G8929 Other chronic pain: Secondary | ICD-10-CM

## 2017-09-15 DIAGNOSIS — M545 Low back pain, unspecified: Secondary | ICD-10-CM

## 2017-09-15 DIAGNOSIS — M5416 Radiculopathy, lumbar region: Secondary | ICD-10-CM | POA: Diagnosis not present

## 2017-09-23 ENCOUNTER — Ambulatory Visit
Admission: RE | Admit: 2017-09-23 | Discharge: 2017-09-23 | Disposition: A | Discharge status: Home / Self Care | Payer: Medicare HMO | Source: Ambulatory Visit | Attending: Physical Medicine and Rehabilitation | Admitting: Physical Medicine and Rehabilitation

## 2017-09-23 ENCOUNTER — Other Ambulatory Visit (HOSPITAL_COMMUNITY): Payer: Self-pay | Admitting: Diagnostic Radiology

## 2017-09-23 ENCOUNTER — Ambulatory Visit
Admission: RE | Admit: 2017-09-23 | Discharge: 2017-09-23 | Disposition: A | Discharge status: Home / Self Care | Payer: Medicare HMO | Source: Ambulatory Visit | Attending: Diagnostic Radiology | Admitting: Diagnostic Radiology

## 2017-09-23 DIAGNOSIS — S0550XA Penetrating wound with foreign body of unspecified eyeball, initial encounter: Secondary | ICD-10-CM

## 2017-09-23 DIAGNOSIS — Z135 Encounter for screening for eye and ear disorders: Secondary | ICD-10-CM | POA: Diagnosis not present

## 2017-09-23 DIAGNOSIS — G8929 Other chronic pain: Secondary | ICD-10-CM

## 2017-09-23 DIAGNOSIS — M545 Low back pain, unspecified: Secondary | ICD-10-CM

## 2017-09-23 DIAGNOSIS — M48061 Spinal stenosis, lumbar region without neurogenic claudication: Secondary | ICD-10-CM | POA: Diagnosis not present

## 2017-09-29 DIAGNOSIS — M48061 Spinal stenosis, lumbar region without neurogenic claudication: Secondary | ICD-10-CM | POA: Diagnosis not present

## 2017-10-06 DIAGNOSIS — Z1211 Encounter for screening for malignant neoplasm of colon: Secondary | ICD-10-CM | POA: Diagnosis not present

## 2017-10-18 DIAGNOSIS — M48061 Spinal stenosis, lumbar region without neurogenic claudication: Secondary | ICD-10-CM | POA: Diagnosis not present

## 2017-11-03 DIAGNOSIS — M48061 Spinal stenosis, lumbar region without neurogenic claudication: Secondary | ICD-10-CM | POA: Diagnosis not present

## 2017-11-22 DIAGNOSIS — M48061 Spinal stenosis, lumbar region without neurogenic claudication: Secondary | ICD-10-CM | POA: Diagnosis not present

## 2018-01-11 DIAGNOSIS — E782 Mixed hyperlipidemia: Secondary | ICD-10-CM | POA: Diagnosis not present

## 2018-01-11 DIAGNOSIS — I10 Essential (primary) hypertension: Secondary | ICD-10-CM | POA: Diagnosis not present

## 2018-01-11 DIAGNOSIS — R251 Tremor, unspecified: Secondary | ICD-10-CM | POA: Diagnosis not present

## 2018-01-11 DIAGNOSIS — E890 Postprocedural hypothyroidism: Secondary | ICD-10-CM | POA: Diagnosis not present

## 2018-01-11 DIAGNOSIS — E1165 Type 2 diabetes mellitus with hyperglycemia: Secondary | ICD-10-CM | POA: Diagnosis not present

## 2018-01-11 DIAGNOSIS — E6609 Other obesity due to excess calories: Secondary | ICD-10-CM | POA: Diagnosis not present

## 2018-01-11 DIAGNOSIS — E039 Hypothyroidism, unspecified: Secondary | ICD-10-CM | POA: Diagnosis not present

## 2018-01-11 DIAGNOSIS — Z6830 Body mass index (BMI) 30.0-30.9, adult: Secondary | ICD-10-CM | POA: Diagnosis not present

## 2018-01-11 DIAGNOSIS — Z1389 Encounter for screening for other disorder: Secondary | ICD-10-CM | POA: Diagnosis not present

## 2018-01-15 ENCOUNTER — Other Ambulatory Visit (HOSPITAL_COMMUNITY): Payer: Self-pay | Admitting: Family Medicine

## 2018-01-15 DIAGNOSIS — Z1231 Encounter for screening mammogram for malignant neoplasm of breast: Secondary | ICD-10-CM

## 2018-03-08 DIAGNOSIS — Z23 Encounter for immunization: Secondary | ICD-10-CM | POA: Diagnosis not present

## 2018-05-28 DIAGNOSIS — Z6829 Body mass index (BMI) 29.0-29.9, adult: Secondary | ICD-10-CM | POA: Diagnosis not present

## 2018-05-28 DIAGNOSIS — I10 Essential (primary) hypertension: Secondary | ICD-10-CM | POA: Diagnosis not present

## 2018-05-28 DIAGNOSIS — Z0001 Encounter for general adult medical examination with abnormal findings: Secondary | ICD-10-CM | POA: Diagnosis not present

## 2018-05-28 DIAGNOSIS — E782 Mixed hyperlipidemia: Secondary | ICD-10-CM | POA: Diagnosis not present

## 2018-05-28 DIAGNOSIS — E663 Overweight: Secondary | ICD-10-CM | POA: Diagnosis not present

## 2018-05-28 DIAGNOSIS — R251 Tremor, unspecified: Secondary | ICD-10-CM | POA: Diagnosis not present

## 2018-05-28 DIAGNOSIS — M48061 Spinal stenosis, lumbar region without neurogenic claudication: Secondary | ICD-10-CM | POA: Diagnosis not present

## 2018-05-28 DIAGNOSIS — R7309 Other abnormal glucose: Secondary | ICD-10-CM | POA: Diagnosis not present

## 2018-05-28 DIAGNOSIS — E039 Hypothyroidism, unspecified: Secondary | ICD-10-CM | POA: Diagnosis not present

## 2018-05-28 DIAGNOSIS — Z1389 Encounter for screening for other disorder: Secondary | ICD-10-CM | POA: Diagnosis not present

## 2018-06-06 ENCOUNTER — Other Ambulatory Visit (HOSPITAL_COMMUNITY): Payer: Self-pay | Admitting: Family Medicine

## 2018-06-06 DIAGNOSIS — Z1231 Encounter for screening mammogram for malignant neoplasm of breast: Secondary | ICD-10-CM

## 2018-06-06 DIAGNOSIS — Z780 Asymptomatic menopausal state: Secondary | ICD-10-CM

## 2018-07-19 DIAGNOSIS — H6993 Unspecified Eustachian tube disorder, bilateral: Secondary | ICD-10-CM | POA: Diagnosis not present

## 2018-07-19 DIAGNOSIS — J029 Acute pharyngitis, unspecified: Secondary | ICD-10-CM | POA: Diagnosis not present

## 2018-07-19 DIAGNOSIS — Z6829 Body mass index (BMI) 29.0-29.9, adult: Secondary | ICD-10-CM | POA: Diagnosis not present

## 2018-07-19 DIAGNOSIS — E663 Overweight: Secondary | ICD-10-CM | POA: Diagnosis not present

## 2018-07-24 DIAGNOSIS — H6993 Unspecified Eustachian tube disorder, bilateral: Secondary | ICD-10-CM | POA: Diagnosis not present

## 2018-07-24 DIAGNOSIS — H9203 Otalgia, bilateral: Secondary | ICD-10-CM | POA: Diagnosis not present

## 2018-07-24 DIAGNOSIS — Z6829 Body mass index (BMI) 29.0-29.9, adult: Secondary | ICD-10-CM | POA: Diagnosis not present

## 2018-07-24 DIAGNOSIS — I10 Essential (primary) hypertension: Secondary | ICD-10-CM | POA: Diagnosis not present

## 2018-10-22 DIAGNOSIS — E663 Overweight: Secondary | ICD-10-CM | POA: Diagnosis not present

## 2018-10-22 DIAGNOSIS — J302 Other seasonal allergic rhinitis: Secondary | ICD-10-CM | POA: Diagnosis not present

## 2018-10-22 DIAGNOSIS — J069 Acute upper respiratory infection, unspecified: Secondary | ICD-10-CM | POA: Diagnosis not present

## 2018-10-22 DIAGNOSIS — Z6829 Body mass index (BMI) 29.0-29.9, adult: Secondary | ICD-10-CM | POA: Diagnosis not present

## 2018-11-19 DIAGNOSIS — E039 Hypothyroidism, unspecified: Secondary | ICD-10-CM | POA: Diagnosis not present

## 2018-11-19 DIAGNOSIS — E7849 Other hyperlipidemia: Secondary | ICD-10-CM | POA: Diagnosis not present

## 2018-11-19 DIAGNOSIS — I10 Essential (primary) hypertension: Secondary | ICD-10-CM | POA: Diagnosis not present

## 2018-11-19 DIAGNOSIS — Z6829 Body mass index (BMI) 29.0-29.9, adult: Secondary | ICD-10-CM | POA: Diagnosis not present

## 2018-11-19 DIAGNOSIS — R251 Tremor, unspecified: Secondary | ICD-10-CM | POA: Diagnosis not present

## 2018-11-19 DIAGNOSIS — E890 Postprocedural hypothyroidism: Secondary | ICD-10-CM | POA: Diagnosis not present

## 2018-11-19 DIAGNOSIS — Z0001 Encounter for general adult medical examination with abnormal findings: Secondary | ICD-10-CM | POA: Diagnosis not present

## 2018-11-19 DIAGNOSIS — E1165 Type 2 diabetes mellitus with hyperglycemia: Secondary | ICD-10-CM | POA: Diagnosis not present

## 2018-11-19 DIAGNOSIS — E663 Overweight: Secondary | ICD-10-CM | POA: Diagnosis not present

## 2018-11-19 DIAGNOSIS — E559 Vitamin D deficiency, unspecified: Secondary | ICD-10-CM | POA: Diagnosis not present

## 2018-11-19 DIAGNOSIS — Z1389 Encounter for screening for other disorder: Secondary | ICD-10-CM | POA: Diagnosis not present

## 2018-11-23 DIAGNOSIS — E6609 Other obesity due to excess calories: Secondary | ICD-10-CM | POA: Diagnosis not present

## 2018-11-23 DIAGNOSIS — Z1211 Encounter for screening for malignant neoplasm of colon: Secondary | ICD-10-CM | POA: Diagnosis not present

## 2018-11-23 DIAGNOSIS — H9202 Otalgia, left ear: Secondary | ICD-10-CM | POA: Diagnosis not present

## 2018-11-23 DIAGNOSIS — H6692 Otitis media, unspecified, left ear: Secondary | ICD-10-CM | POA: Diagnosis not present

## 2018-11-23 DIAGNOSIS — Z6830 Body mass index (BMI) 30.0-30.9, adult: Secondary | ICD-10-CM | POA: Diagnosis not present

## 2018-12-11 DIAGNOSIS — Z6830 Body mass index (BMI) 30.0-30.9, adult: Secondary | ICD-10-CM | POA: Diagnosis not present

## 2018-12-11 DIAGNOSIS — H9202 Otalgia, left ear: Secondary | ICD-10-CM | POA: Diagnosis not present

## 2018-12-11 DIAGNOSIS — E6609 Other obesity due to excess calories: Secondary | ICD-10-CM | POA: Diagnosis not present

## 2019-01-09 ENCOUNTER — Other Ambulatory Visit: Payer: Self-pay

## 2019-01-09 ENCOUNTER — Ambulatory Visit (HOSPITAL_COMMUNITY)
Admission: RE | Admit: 2019-01-09 | Discharge: 2019-01-09 | Disposition: A | Discharge status: Home / Self Care | Payer: PPO | Source: Ambulatory Visit | Attending: Family Medicine | Admitting: Family Medicine

## 2019-01-09 ENCOUNTER — Other Ambulatory Visit (HOSPITAL_COMMUNITY): Payer: Medicare HMO

## 2019-01-09 DIAGNOSIS — Z1231 Encounter for screening mammogram for malignant neoplasm of breast: Secondary | ICD-10-CM | POA: Insufficient documentation

## 2019-01-17 ENCOUNTER — Ambulatory Visit (INDEPENDENT_AMBULATORY_CARE_PROVIDER_SITE_OTHER): Payer: PPO | Admitting: Otolaryngology

## 2019-01-17 DIAGNOSIS — H93293 Other abnormal auditory perceptions, bilateral: Secondary | ICD-10-CM

## 2019-01-17 DIAGNOSIS — H9209 Otalgia, unspecified ear: Secondary | ICD-10-CM | POA: Diagnosis not present

## 2019-01-17 DIAGNOSIS — R42 Dizziness and giddiness: Secondary | ICD-10-CM

## 2019-02-21 DIAGNOSIS — Z23 Encounter for immunization: Secondary | ICD-10-CM | POA: Diagnosis not present

## 2019-03-01 ENCOUNTER — Emergency Department (HOSPITAL_COMMUNITY): Admission: EM | Admit: 2019-03-01 | Discharge: 2019-03-01 | Discharge status: Absent without leave | Payer: PPO

## 2019-03-01 ENCOUNTER — Ambulatory Visit (HOSPITAL_COMMUNITY)
Admission: RE | Admit: 2019-03-01 | Discharge: 2019-03-01 | Disposition: A | Discharge status: Home / Self Care | Payer: PPO | Source: Ambulatory Visit | Attending: Family Medicine | Admitting: Family Medicine

## 2019-03-01 ENCOUNTER — Other Ambulatory Visit (HOSPITAL_COMMUNITY): Payer: Self-pay | Admitting: Family Medicine

## 2019-03-01 ENCOUNTER — Other Ambulatory Visit: Payer: Self-pay | Admitting: Family Medicine

## 2019-03-01 ENCOUNTER — Other Ambulatory Visit: Payer: Self-pay

## 2019-03-01 DIAGNOSIS — R1031 Right lower quadrant pain: Secondary | ICD-10-CM

## 2019-03-01 DIAGNOSIS — K449 Diaphragmatic hernia without obstruction or gangrene: Secondary | ICD-10-CM | POA: Diagnosis not present

## 2019-03-01 DIAGNOSIS — R7309 Other abnormal glucose: Secondary | ICD-10-CM | POA: Diagnosis not present

## 2019-03-01 DIAGNOSIS — M898X8 Other specified disorders of bone, other site: Secondary | ICD-10-CM | POA: Diagnosis not present

## 2019-03-01 DIAGNOSIS — M8588 Other specified disorders of bone density and structure, other site: Secondary | ICD-10-CM | POA: Diagnosis not present

## 2019-03-01 DIAGNOSIS — K5730 Diverticulosis of large intestine without perforation or abscess without bleeding: Secondary | ICD-10-CM | POA: Diagnosis not present

## 2019-03-01 DIAGNOSIS — Q791 Other congenital malformations of diaphragm: Secondary | ICD-10-CM | POA: Diagnosis not present

## 2019-03-01 DIAGNOSIS — K760 Fatty (change of) liver, not elsewhere classified: Secondary | ICD-10-CM | POA: Diagnosis not present

## 2019-03-01 DIAGNOSIS — N281 Cyst of kidney, acquired: Secondary | ICD-10-CM | POA: Diagnosis not present

## 2019-03-01 DIAGNOSIS — I700 Atherosclerosis of aorta: Secondary | ICD-10-CM | POA: Diagnosis not present

## 2019-03-01 DIAGNOSIS — Z6831 Body mass index (BMI) 31.0-31.9, adult: Secondary | ICD-10-CM | POA: Diagnosis not present

## 2019-03-01 DIAGNOSIS — N132 Hydronephrosis with renal and ureteral calculous obstruction: Secondary | ICD-10-CM | POA: Diagnosis not present

## 2019-03-01 DIAGNOSIS — N390 Urinary tract infection, site not specified: Secondary | ICD-10-CM | POA: Diagnosis not present

## 2019-03-01 DIAGNOSIS — E6609 Other obesity due to excess calories: Secondary | ICD-10-CM | POA: Diagnosis not present

## 2019-03-01 LAB — POCT I-STAT CREATININE: Creatinine, Ser: 0.7 mg/dL (ref 0.44–1.00)

## 2019-03-01 MED ORDER — IOHEXOL 300 MG/ML  SOLN
100.0000 mL | Freq: Once | INTRAMUSCULAR | Status: AC | PRN
  Administered 2019-03-01: 100 mL via INTRAVENOUS

## 2019-03-05 ENCOUNTER — Other Ambulatory Visit: Payer: Self-pay | Admitting: Family Medicine

## 2019-03-05 DIAGNOSIS — R1031 Right lower quadrant pain: Secondary | ICD-10-CM

## 2019-03-12 ENCOUNTER — Ambulatory Visit (INDEPENDENT_AMBULATORY_CARE_PROVIDER_SITE_OTHER): Payer: PPO | Admitting: Urology

## 2019-03-12 DIAGNOSIS — N3000 Acute cystitis without hematuria: Secondary | ICD-10-CM | POA: Diagnosis not present

## 2019-03-12 DIAGNOSIS — N202 Calculus of kidney with calculus of ureter: Secondary | ICD-10-CM

## 2019-03-13 ENCOUNTER — Other Ambulatory Visit: Payer: Self-pay | Admitting: Urology

## 2019-03-13 ENCOUNTER — Other Ambulatory Visit (HOSPITAL_COMMUNITY): Payer: Self-pay | Admitting: Urology

## 2019-03-13 DIAGNOSIS — N200 Calculus of kidney: Secondary | ICD-10-CM

## 2019-03-22 ENCOUNTER — Other Ambulatory Visit (HOSPITAL_COMMUNITY): Payer: Self-pay | Admitting: Family Medicine

## 2019-03-22 DIAGNOSIS — E2839 Other primary ovarian failure: Secondary | ICD-10-CM

## 2019-03-29 DIAGNOSIS — Z6830 Body mass index (BMI) 30.0-30.9, adult: Secondary | ICD-10-CM | POA: Diagnosis not present

## 2019-03-29 DIAGNOSIS — N342 Other urethritis: Secondary | ICD-10-CM | POA: Diagnosis not present

## 2019-03-29 DIAGNOSIS — I10 Essential (primary) hypertension: Secondary | ICD-10-CM | POA: Diagnosis not present

## 2019-03-29 DIAGNOSIS — R7309 Other abnormal glucose: Secondary | ICD-10-CM | POA: Diagnosis not present

## 2019-03-29 DIAGNOSIS — N202 Calculus of kidney with calculus of ureter: Secondary | ICD-10-CM | POA: Diagnosis not present

## 2019-04-02 ENCOUNTER — Other Ambulatory Visit: Payer: Self-pay

## 2019-04-02 ENCOUNTER — Ambulatory Visit (HOSPITAL_COMMUNITY)
Admission: RE | Admit: 2019-04-02 | Discharge: 2019-04-02 | Disposition: A | Discharge status: Home / Self Care | Payer: PPO | Source: Ambulatory Visit | Attending: Family Medicine | Admitting: Family Medicine

## 2019-04-02 DIAGNOSIS — E2839 Other primary ovarian failure: Secondary | ICD-10-CM | POA: Diagnosis not present

## 2019-04-08 DIAGNOSIS — Z6830 Body mass index (BMI) 30.0-30.9, adult: Secondary | ICD-10-CM | POA: Diagnosis not present

## 2019-04-08 DIAGNOSIS — E6609 Other obesity due to excess calories: Secondary | ICD-10-CM | POA: Diagnosis not present

## 2019-04-08 DIAGNOSIS — T461X5A Adverse effect of calcium-channel blockers, initial encounter: Secondary | ICD-10-CM | POA: Diagnosis not present

## 2019-04-09 ENCOUNTER — Other Ambulatory Visit: Payer: Self-pay

## 2019-04-09 ENCOUNTER — Ambulatory Visit (INDEPENDENT_AMBULATORY_CARE_PROVIDER_SITE_OTHER): Payer: PPO | Admitting: Urology

## 2019-04-09 ENCOUNTER — Other Ambulatory Visit (HOSPITAL_COMMUNITY): Payer: Self-pay | Admitting: Urology

## 2019-04-09 ENCOUNTER — Ambulatory Visit (HOSPITAL_COMMUNITY)
Admission: RE | Admit: 2019-04-09 | Discharge: 2019-04-09 | Disposition: A | Discharge status: Home / Self Care | Payer: PPO | Source: Ambulatory Visit | Attending: Urology | Admitting: Urology

## 2019-04-09 DIAGNOSIS — R1084 Generalized abdominal pain: Secondary | ICD-10-CM

## 2019-04-09 DIAGNOSIS — N202 Calculus of kidney with calculus of ureter: Secondary | ICD-10-CM

## 2019-04-11 DIAGNOSIS — M25551 Pain in right hip: Secondary | ICD-10-CM | POA: Diagnosis not present

## 2019-04-11 DIAGNOSIS — M1991 Primary osteoarthritis, unspecified site: Secondary | ICD-10-CM | POA: Diagnosis not present

## 2019-04-11 DIAGNOSIS — Z6830 Body mass index (BMI) 30.0-30.9, adult: Secondary | ICD-10-CM | POA: Diagnosis not present

## 2019-04-11 DIAGNOSIS — E6609 Other obesity due to excess calories: Secondary | ICD-10-CM | POA: Diagnosis not present

## 2019-04-11 DIAGNOSIS — M5431 Sciatica, right side: Secondary | ICD-10-CM | POA: Diagnosis not present

## 2019-04-24 NOTE — Patient Instructions (Addendum)
DUE TO COVID-19 ONLY ONE VISITOR IS ALLOWED TO COME WITH YOU AND STAY IN THE WAITING ROOM ONLY DURING PRE OP AND PROCEDURE DAY OF SURGERY. THE 1 VISITOR MAY VISIT WITH YOU AFTER SURGERY IN YOUR PRIVATE ROOM DURING VISITING HOURS ONLY!  YOU NEED TO HAVE A COVID 19 TEST ON Monday 04/29/2019 @9 :05 AM, THIS TEST MUST BE DONE BEFORE SURGERY, COME  TO [facility]. ONCE YOUR COVID TEST IS COMPLETED, PLEASE BEGIN THE QUARANTINE INSTRUCTIONS AS OUTLINED IN YOUR HANDOUT.                Patricia [name]    Your procedure is scheduled on: Thursday 05/02/2019   Report to [facility] Main  Entrance    Report to Interventional Radiology at 8:00 AM    Call this number if you have problems the morning of surgery [phone]    Remember: Do not eat food or drink liquids after Midnight.    BRUSH YOUR TEETH MORNING OF SURGERY AND RINSE YOUR MOUTH OUT, NO CHEWING GUM CANDY OR MINTS.     Take these medicines the morning of surgery with A SIP OF WATER: Certrizine (Zyrtec), Simvastatin (Zocor), Levothyroxine (Synthroid), and Ranitidine (Zantac)                                You may not have any metal on your body including hair pins and              piercings     Do not wear jewelry, make-up, lotions, powders or perfumes, deodorant              Do not wear nail polish on your fingernails.  Do not shave  48 hours prior to surgery.                Do not bring valuables to the hospital. [facility] IS NOT             RESPONSIBLE   FOR VALUABLES.  Contacts, dentures or bridgework may not be worn into surgery.  Leave suitcase in the car. After surgery it may be brought to your room.     _____________________________________________________________________  How to Manage Your Diabetes Before and After Surgery  Why is it important to control my blood sugar before and after surgery? . Improving blood sugar levels before and after surgery helps healing and can limit problems. . A way  of improving blood sugar control is eating a healthy diet by: o  Eating less sugar and carbohydrates o  Increasing activity/exercise o  Talking with your doctor about reaching your blood sugar goals . High blood sugars (greater than 180 mg/dL) can raise your risk of infections and slow your recovery, so you will need to focus on controlling your diabetes during the weeks before surgery. . Make sure that the doctor who takes care of your diabetes knows about your planned surgery including the date and location.  How do I manage my blood sugar before surgery? . Check your blood sugar at least 4 times a day, starting 2 days before surgery, to make sure that the level is not too high or low. o Check your blood sugar the morning of your surgery when you wake up and every 2 hours until you get to the Short Stay unit. . If your blood sugar is less than 70 mg/dL, you will need to treat for low blood sugar:  o Do not take insulin. o Treat a low blood sugar (less than 70 mg/dL) with  cup of clear juice (cranberry or apple), 4 glucose tablets, OR glucose gel. o Recheck blood sugar in 15 minutes after treatment (to make sure it is greater than 70 mg/dL). If your blood sugar is not greater than 70 mg/dL on recheck, call [phone] for further instructions. . Report your blood sugar to the short stay nurse when you get to Short Stay.  . If you are admitted to the hospital after surgery: o Your blood sugar will be checked by the staff and you will probably be given insulin after surgery (instead of oral diabetes medicines) to make sure you have good blood sugar levels. o The goal for blood sugar control after surgery is 80-180 mg/dL.   WHAT DO I DO ABOUT MY DIABETES MEDICATION?  Marland Kitchen. Do not take oral diabetes medicines (pills) the morning of surgery.  . THE DAY BEFORE SURGERY, take your usual Metformin                [facility] - Preparing for Surgery Before surgery, you can play an important role.   Because skin is not sterile, your skin needs to be as free of germs as possible.  You can reduce the number of germs on your skin by washing with CHG (chlorahexidine gluconate) soap before surgery.  CHG is an antiseptic cleaner which kills germs and bonds with the skin to continue killing germs even after washing. Please DO NOT use if you have an allergy to CHG or antibacterial soaps.  If your skin becomes reddened/irritated stop using the CHG and inform your nurse when you arrive at Short Stay. Do not shave (including legs and underarms) for at least 48 hours prior to the first CHG shower.  You may shave your face/neck. Please follow these instructions carefully:  1.  Shower with CHG Soap the night before surgery and the  morning of Surgery.  2.  If you choose to wash your hair, wash your hair first as usual with your  normal  shampoo.  3.  After you shampoo, rinse your hair and body thoroughly to remove the  shampoo.                             4.  Use CHG as you would any other liquid soap.  You can apply chg directly  to the skin and wash                       Gently with a scrungie or clean washcloth.  5.  Apply the CHG Soap to your body ONLY FROM THE NECK DOWN.   Do not use on face/ open                           Wound or open sores. Avoid contact with eyes, ears mouth and genitals (private parts).                       Wash face,  Genitals (private parts) with your normal soap.             6.  Wash thoroughly, paying special attention to the area where your surgery  will be performed.  7.  Thoroughly rinse your body with warm water from the neck down.  8.  DO NOT  shower/wash with your normal soap after using and rinsing off  the CHG Soap.                9.  Pat yourself dry with a clean towel.            10.  Wear clean pajamas.            11.  Place clean sheets on your bed the night of your first shower and do not  sleep with pets. Day of Surgery : Do not apply any lotions/deodorants the  morning of surgery.  Please wear clean clothes to the hospital/surgery center.  FAILURE TO FOLLOW THESE INSTRUCTIONS MAY RESULT IN THE CANCELLATION OF YOUR SURGERY PATIENT SIGNATURE_________________________________  NURSE SIGNATURE__________________________________  ________________________________________________________________________  WHAT IS A BLOOD TRANSFUSION? Blood Transfusion Information  A transfusion is the replacement of blood or some of its parts. Blood is made up of multiple cells which provide different functions.  Red blood cells carry oxygen and are used for blood loss replacement.  White blood cells fight against infection.  Platelets control bleeding.  Plasma helps clot blood.  Other blood products are available for specialized needs, such as hemophilia or other clotting disorders. BEFORE THE TRANSFUSION  Who gives blood for transfusions?   Healthy volunteers who are fully evaluated to make sure their blood is safe. This is blood bank blood. Transfusion therapy is the safest it has ever been in the practice of medicine. Before blood is taken from a donor, a complete history is taken to make sure that person has no history of diseases nor engages in risky social behavior (examples are intravenous drug use or sexual activity with multiple partners). The donor's travel history is screened to minimize risk of transmitting infections, such as malaria. The donated blood is tested for signs of infectious diseases, such as HIV and hepatitis. The blood is then tested to be sure it is compatible with you in order to minimize the chance of a transfusion reaction. If you or a relative donates blood, this is often done in anticipation of surgery and is not appropriate for emergency situations. It takes many days to process the donated blood. RISKS AND COMPLICATIONS Although transfusion therapy is very safe and saves many lives, the main dangers of transfusion include:   Getting an  infectious disease.  Developing a transfusion reaction. This is an allergic reaction to something in the blood you were given. Every precaution is taken to prevent this. The decision to have a blood transfusion has been considered carefully by your caregiver before blood is given. Blood is not given unless the benefits outweigh the risks. AFTER THE TRANSFUSION  Right after receiving a blood transfusion, you will usually feel much better and more energetic. This is especially true if your red blood cells have gotten low (anemic). The transfusion raises the level of the red blood cells which carry oxygen, and this usually causes an energy increase.  The nurse administering the transfusion will monitor you carefully for complications. HOME CARE INSTRUCTIONS  No special instructions are needed after a transfusion. You may find your energy is better. Speak with your caregiver about any limitations on activity for underlying diseases you may have. SEEK MEDICAL CARE IF:   Your condition is not improving after your transfusion.  You develop redness or irritation at the intravenous (IV) site. SEEK IMMEDIATE MEDICAL CARE IF:  Any of the following symptoms occur over the next 12 hours:  Shaking chills.  You have a  temperature by mouth above 102 F (38.9 C), not controlled by medicine.  Chest, back, or muscle pain.  People around you feel you are not acting correctly or are confused.  Shortness of breath or difficulty breathing.  Dizziness and fainting.  You get a rash or develop hives.  You have a decrease in urine output.  Your urine turns a dark color or changes to pink, red, or brown. Any of the following symptoms occur over the next 10 days:  You have a temperature by mouth above 102 F (38.9 C), not controlled by medicine.  Shortness of breath.  Weakness after normal activity.  The white part of the eye turns yellow (jaundice).  You have a decrease in the amount of urine or  are urinating less often.  Your urine turns a dark color or changes to pink, red, or brown. Document Released: 06/03/2000 Document Revised: 08/29/2011 Document Reviewed: 01/21/2008 [facility] Patient Information 2014 [place], [place].  _______________________________________________________________________

## 2019-04-26 ENCOUNTER — Encounter (HOSPITAL_COMMUNITY)
Admission: RE | Admit: 2019-04-26 | Discharge: 2019-04-26 | Disposition: A | Discharge status: Home / Self Care | Payer: PPO | Source: Ambulatory Visit | Attending: Urology | Admitting: Urology

## 2019-04-26 ENCOUNTER — Other Ambulatory Visit: Payer: Self-pay

## 2019-04-26 ENCOUNTER — Encounter (HOSPITAL_COMMUNITY): Payer: Self-pay

## 2019-04-26 DIAGNOSIS — N202 Calculus of kidney with calculus of ureter: Secondary | ICD-10-CM | POA: Diagnosis not present

## 2019-04-26 DIAGNOSIS — Z01818 Encounter for other preprocedural examination: Secondary | ICD-10-CM | POA: Diagnosis not present

## 2019-04-26 DIAGNOSIS — I10 Essential (primary) hypertension: Secondary | ICD-10-CM | POA: Insufficient documentation

## 2019-04-26 DIAGNOSIS — R9431 Abnormal electrocardiogram [ECG] [EKG]: Secondary | ICD-10-CM | POA: Diagnosis not present

## 2019-04-26 HISTORY — DX: Type 2 diabetes mellitus without complications: E11.9

## 2019-04-26 HISTORY — DX: Hypothyroidism, unspecified: E03.9

## 2019-04-26 HISTORY — DX: Unspecified osteoarthritis, unspecified site: M19.90

## 2019-04-26 LAB — CBC
HCT: 45.6 % (ref 36.0–46.0)
Hemoglobin: 14.1 g/dL (ref 12.0–15.0)
MCH: 27.9 pg (ref 26.0–34.0)
MCHC: 30.9 g/dL (ref 30.0–36.0)
MCV: 90.3 fL (ref 80.0–100.0)
Platelets: 321 10*3/uL (ref 150–400)
RBC: 5.05 MIL/uL (ref 3.87–5.11)
RDW: 14.9 % (ref 11.5–15.5)
WBC: 14 10*3/uL — ABNORMAL HIGH (ref 4.0–10.5)
nRBC: 0 % (ref 0.0–0.2)

## 2019-04-26 LAB — GLUCOSE, CAPILLARY: Glucose-Capillary: 200 mg/dL — ABNORMAL HIGH (ref 70–99)

## 2019-04-26 LAB — HEMOGLOBIN A1C
Hgb A1c MFr Bld: 6.9 % — ABNORMAL HIGH (ref 4.8–5.6)
Mean Plasma Glucose: 151.33 mg/dL

## 2019-04-26 LAB — BASIC METABOLIC PANEL
Anion gap: 12 (ref 5–15)
BUN: 15 mg/dL (ref 8–23)
CO2: 25 mmol/L (ref 22–32)
Calcium: 9.8 mg/dL (ref 8.9–10.3)
Chloride: 103 mmol/L (ref 98–111)
Creatinine, Ser: 0.73 mg/dL (ref 0.44–1.00)
GFR calc Af Amer: >60 mL/min (ref >60)
GFR calc non Af Amer: >60 mL/min (ref >60)
Glucose, Bld: 191 mg/dL — ABNORMAL HIGH (ref 70–99)
Potassium: 4.8 mmol/L (ref 3.5–5.1)
Sodium: 140 mmol/L (ref 135–145)

## 2019-04-27 LAB — ABO/RH: ABO/RH(D): A POS

## 2019-04-29 ENCOUNTER — Other Ambulatory Visit (HOSPITAL_COMMUNITY)
Admission: RE | Admit: 2019-04-29 | Discharge: 2019-04-29 | Disposition: A | Discharge status: Home / Self Care | Payer: PPO | Source: Ambulatory Visit | Attending: Urology | Admitting: Urology

## 2019-04-29 ENCOUNTER — Other Ambulatory Visit: Payer: Self-pay

## 2019-04-29 DIAGNOSIS — Z20828 Contact with and (suspected) exposure to other viral communicable diseases: Secondary | ICD-10-CM | POA: Insufficient documentation

## 2019-04-29 DIAGNOSIS — Z01812 Encounter for preprocedural laboratory examination: Secondary | ICD-10-CM | POA: Insufficient documentation

## 2019-04-29 LAB — SARS CORONAVIRUS 2 (TAT 6-24 HRS): SARS Coronavirus 2: NEGATIVE

## 2019-05-01 ENCOUNTER — Other Ambulatory Visit: Payer: Self-pay | Admitting: Physician Assistant

## 2019-05-02 ENCOUNTER — Ambulatory Visit (HOSPITAL_COMMUNITY): Payer: PPO | Admitting: Physician Assistant

## 2019-05-02 ENCOUNTER — Encounter (HOSPITAL_COMMUNITY): Payer: Self-pay

## 2019-05-02 ENCOUNTER — Ambulatory Visit (HOSPITAL_COMMUNITY)
Admission: RE | Admit: 2019-05-02 | Discharge: 2019-05-02 | Disposition: A | Discharge status: Home / Self Care | Payer: PPO | Source: Ambulatory Visit | Attending: Family Medicine | Admitting: Family Medicine

## 2019-05-02 ENCOUNTER — Other Ambulatory Visit: Payer: Self-pay

## 2019-05-02 ENCOUNTER — Ambulatory Visit (HOSPITAL_COMMUNITY): Payer: PPO

## 2019-05-02 ENCOUNTER — Encounter (HOSPITAL_COMMUNITY)
Admission: RE | Disposition: A | Discharge status: Home / Self Care | Payer: Self-pay | Source: Home / Self Care | Attending: Urology

## 2019-05-02 ENCOUNTER — Ambulatory Visit (HOSPITAL_COMMUNITY)
Admission: RE | Admit: 2019-05-02 | Discharge: 2019-05-02 | Disposition: A | Discharge status: Home / Self Care | Payer: PPO | Source: Ambulatory Visit | Attending: Urology | Admitting: Urology

## 2019-05-02 ENCOUNTER — Observation Stay (HOSPITAL_COMMUNITY)
Admission: RE | Admit: 2019-05-02 | Discharge: 2019-05-03 | Disposition: A | Discharge status: Home / Self Care | Payer: PPO | Attending: Urology | Admitting: Urology

## 2019-05-02 ENCOUNTER — Ambulatory Visit (HOSPITAL_COMMUNITY): Payer: PPO | Admitting: Certified Registered"

## 2019-05-02 DIAGNOSIS — E039 Hypothyroidism, unspecified: Secondary | ICD-10-CM | POA: Insufficient documentation

## 2019-05-02 DIAGNOSIS — N202 Calculus of kidney with calculus of ureter: Secondary | ICD-10-CM | POA: Insufficient documentation

## 2019-05-02 DIAGNOSIS — Z79899 Other long term (current) drug therapy: Secondary | ICD-10-CM | POA: Insufficient documentation

## 2019-05-02 DIAGNOSIS — N201 Calculus of ureter: Secondary | ICD-10-CM | POA: Diagnosis not present

## 2019-05-02 DIAGNOSIS — K219 Gastro-esophageal reflux disease without esophagitis: Secondary | ICD-10-CM | POA: Insufficient documentation

## 2019-05-02 DIAGNOSIS — Z888 Allergy status to other drugs, medicaments and biological substances status: Secondary | ICD-10-CM | POA: Insufficient documentation

## 2019-05-02 DIAGNOSIS — Z79890 Hormone replacement therapy (postmenopausal): Secondary | ICD-10-CM | POA: Insufficient documentation

## 2019-05-02 DIAGNOSIS — N200 Calculus of kidney: Secondary | ICD-10-CM | POA: Diagnosis not present

## 2019-05-02 DIAGNOSIS — Z7952 Long term (current) use of systemic steroids: Secondary | ICD-10-CM | POA: Insufficient documentation

## 2019-05-02 DIAGNOSIS — Z881 Allergy status to other antibiotic agents status: Secondary | ICD-10-CM | POA: Insufficient documentation

## 2019-05-02 DIAGNOSIS — I10 Essential (primary) hypertension: Secondary | ICD-10-CM | POA: Insufficient documentation

## 2019-05-02 DIAGNOSIS — Z791 Long term (current) use of non-steroidal anti-inflammatories (NSAID): Secondary | ICD-10-CM | POA: Insufficient documentation

## 2019-05-02 DIAGNOSIS — M1990 Unspecified osteoarthritis, unspecified site: Secondary | ICD-10-CM | POA: Insufficient documentation

## 2019-05-02 DIAGNOSIS — E119 Type 2 diabetes mellitus without complications: Secondary | ICD-10-CM | POA: Insufficient documentation

## 2019-05-02 DIAGNOSIS — Z7984 Long term (current) use of oral hypoglycemic drugs: Secondary | ICD-10-CM | POA: Insufficient documentation

## 2019-05-02 HISTORY — PX: NEPHROLITHOTOMY: SHX5134

## 2019-05-02 HISTORY — PX: HOLMIUM LASER APPLICATION: SHX5852

## 2019-05-02 HISTORY — PX: IR URETERAL STENT RIGHT NEW ACCESS W/O SEP NEPHROSTOMY CATH: IMG6076

## 2019-05-02 LAB — BASIC METABOLIC PANEL
Anion gap: 12 (ref 5–15)
BUN: 16 mg/dL (ref 8–23)
CO2: 21 mmol/L — ABNORMAL LOW (ref 22–32)
Calcium: 9.6 mg/dL (ref 8.9–10.3)
Chloride: 108 mmol/L (ref 98–111)
Creatinine, Ser: 0.84 mg/dL (ref 0.44–1.00)
GFR calc Af Amer: >60 mL/min (ref >60)
GFR calc non Af Amer: >60 mL/min (ref >60)
Glucose, Bld: 174 mg/dL — ABNORMAL HIGH (ref 70–99)
Potassium: 3.7 mmol/L (ref 3.5–5.1)
Sodium: 141 mmol/L (ref 135–145)

## 2019-05-02 LAB — CBC
HCT: 43.4 % (ref 36.0–46.0)
Hemoglobin: 13.7 g/dL (ref 12.0–15.0)
MCH: 27.8 pg (ref 26.0–34.0)
MCHC: 31.6 g/dL (ref 30.0–36.0)
MCV: 88.2 fL (ref 80.0–100.0)
Platelets: 283 10*3/uL (ref 150–400)
RBC: 4.92 MIL/uL (ref 3.87–5.11)
RDW: 15.1 % (ref 11.5–15.5)
WBC: 10.2 10*3/uL (ref 4.0–10.5)
nRBC: 0 % (ref 0.0–0.2)

## 2019-05-02 LAB — GLUCOSE, CAPILLARY
Glucose-Capillary: 167 mg/dL — ABNORMAL HIGH (ref 70–99)
Glucose-Capillary: 206 mg/dL — ABNORMAL HIGH (ref 70–99)

## 2019-05-02 LAB — TYPE AND SCREEN
ABO/RH(D): A POS
Antibody Screen: NEGATIVE

## 2019-05-02 LAB — PROTIME-INR
INR: 0.9 (ref 0.8–1.2)
Prothrombin Time: 12.5 seconds (ref 11.4–15.2)

## 2019-05-02 SURGERY — NEPHROLITHOTOMY PERCUTANEOUS
Anesthesia: General | Laterality: Right

## 2019-05-02 MED ORDER — ONDANSETRON HCL 4 MG/2ML IJ SOLN: 4.0000 mg | INTRAMUSCULAR | Status: DC | PRN

## 2019-05-02 MED ORDER — DEXAMETHASONE SODIUM PHOSPHATE 10 MG/ML IJ SOLN
INTRAMUSCULAR | Status: AC
  Filled 2019-05-02: qty 1

## 2019-05-02 MED ORDER — ROCURONIUM BROMIDE 10 MG/ML (PF) SYRINGE
PREFILLED_SYRINGE | INTRAVENOUS | Status: AC
  Filled 2019-05-02: qty 10

## 2019-05-02 MED ORDER — FENTANYL CITRATE (PF) 100 MCG/2ML IJ SOLN: 50.0000 ug | Freq: Once | INTRAMUSCULAR | Status: DC

## 2019-05-02 MED ORDER — CIPROFLOXACIN IN D5W 400 MG/200ML IV SOLN
400.0000 mg | Freq: Once | INTRAVENOUS | Status: AC
  Administered 2019-05-02: 10:00:00 400 mg via INTRAVENOUS

## 2019-05-02 MED ORDER — ZOLPIDEM TARTRATE 5 MG PO TABS: 5.0000 mg | ORAL_TABLET | Freq: Every evening | ORAL | Status: DC | PRN

## 2019-05-02 MED ORDER — LIDOCAINE HCL (PF) 1 % IJ SOLN
INTRAMUSCULAR | Status: AC | PRN
  Administered 2019-05-02: 10 mL

## 2019-05-02 MED ORDER — ONDANSETRON HCL 4 MG/2ML IJ SOLN
INTRAMUSCULAR | Status: DC | PRN
  Administered 2019-05-02: 4 mg via INTRAVENOUS

## 2019-05-02 MED ORDER — PROPOFOL 10 MG/ML IV BOLUS
INTRAVENOUS | Status: AC
  Filled 2019-05-02: qty 20

## 2019-05-02 MED ORDER — CIPROFLOXACIN IN D5W 400 MG/200ML IV SOLN
INTRAVENOUS | Status: AC
  Administered 2019-05-02: 400 mg via INTRAVENOUS
  Filled 2019-05-02: qty 200

## 2019-05-02 MED ORDER — PHENYLEPHRINE 40 MCG/ML (10ML) SYRINGE FOR IV PUSH (FOR BLOOD PRESSURE SUPPORT)
PREFILLED_SYRINGE | INTRAVENOUS | Status: DC | PRN
  Administered 2019-05-02 (×3): 40 ug via INTRAVENOUS
  Administered 2019-05-02 (×2): 80 ug via INTRAVENOUS
  Administered 2019-05-02 (×3): 40 ug via INTRAVENOUS

## 2019-05-02 MED ORDER — LIDOCAINE 2% (20 MG/ML) 5 ML SYRINGE
INTRAMUSCULAR | Status: AC
  Filled 2019-05-02: qty 5

## 2019-05-02 MED ORDER — OXYCODONE HCL 5 MG PO TABS: 5.0000 mg | ORAL_TABLET | Freq: Once | ORAL | Status: DC | PRN

## 2019-05-02 MED ORDER — OXYCODONE HCL 5 MG PO TABS
5.0000 mg | ORAL_TABLET | ORAL | Status: DC | PRN
  Filled 2019-05-02: qty 1

## 2019-05-02 MED ORDER — MIDAZOLAM HCL 2 MG/2ML IJ SOLN
INTRAMUSCULAR | Status: AC | PRN
  Administered 2019-05-02 (×2): 1 mg via INTRAVENOUS

## 2019-05-02 MED ORDER — SCOPOLAMINE 1 MG/3DAYS TD PT72
MEDICATED_PATCH | TRANSDERMAL | Status: AC
  Administered 2019-05-02: 1.5 mg
  Filled 2019-05-02: qty 1

## 2019-05-02 MED ORDER — MIDAZOLAM HCL 2 MG/2ML IJ SOLN
INTRAMUSCULAR | Status: AC
  Filled 2019-05-02: qty 4

## 2019-05-02 MED ORDER — IOHEXOL 300 MG/ML  SOLN
INTRAMUSCULAR | Status: DC | PRN
  Administered 2019-05-02: 10 mL

## 2019-05-02 MED ORDER — FENTANYL CITRATE (PF) 100 MCG/2ML IJ SOLN
25.0000 ug | INTRAMUSCULAR | Status: DC | PRN
  Administered 2019-05-02: 50 ug via INTRAVENOUS

## 2019-05-02 MED ORDER — LIDOCAINE 2% (20 MG/ML) 5 ML SYRINGE
INTRAMUSCULAR | Status: DC | PRN
  Administered 2019-05-02: 40 mg via INTRAVENOUS

## 2019-05-02 MED ORDER — SIMVASTATIN 20 MG PO TABS
20.0000 mg | ORAL_TABLET | Freq: Every day | ORAL | Status: DC
  Administered 2019-05-03: 20 mg via ORAL
  Filled 2019-05-02: qty 1

## 2019-05-02 MED ORDER — CYCLOBENZAPRINE HCL 10 MG PO TABS: 10.0000 mg | ORAL_TABLET | Freq: Three times a day (TID) | ORAL | Status: DC | PRN

## 2019-05-02 MED ORDER — ONDANSETRON HCL 4 MG/2ML IJ SOLN: 4.0000 mg | Freq: Once | INTRAMUSCULAR | Status: DC | PRN

## 2019-05-02 MED ORDER — SUGAMMADEX SODIUM 200 MG/2ML IV SOLN
INTRAVENOUS | Status: DC | PRN
  Administered 2019-05-02: 170 mg via INTRAVENOUS

## 2019-05-02 MED ORDER — LORATADINE 10 MG PO TABS
10.0000 mg | ORAL_TABLET | Freq: Every day | ORAL | Status: DC
  Administered 2019-05-03: 10 mg via ORAL
  Filled 2019-05-02: qty 1

## 2019-05-02 MED ORDER — DEXAMETHASONE SODIUM PHOSPHATE 10 MG/ML IJ SOLN
INTRAMUSCULAR | Status: DC | PRN
  Administered 2019-05-02: 8 mg via INTRAVENOUS

## 2019-05-02 MED ORDER — SENNA 8.6 MG PO TABS
1.0000 | ORAL_TABLET | Freq: Two times a day (BID) | ORAL | Status: DC
  Administered 2019-05-02 – 2019-05-03 (×2): 8.6 mg via ORAL
  Filled 2019-05-02 (×2): qty 1

## 2019-05-02 MED ORDER — METFORMIN HCL 500 MG PO TABS
500.0000 mg | ORAL_TABLET | Freq: Two times a day (BID) | ORAL | Status: DC
  Administered 2019-05-03: 500 mg via ORAL
  Filled 2019-05-02: qty 1

## 2019-05-02 MED ORDER — ACETAMINOPHEN 325 MG PO TABS: 325.0000 mg | ORAL_TABLET | ORAL | Status: DC | PRN

## 2019-05-02 MED ORDER — FENTANYL CITRATE (PF) 100 MCG/2ML IJ SOLN
INTRAMUSCULAR | Status: AC
  Filled 2019-05-02: qty 2

## 2019-05-02 MED ORDER — MEPERIDINE HCL 50 MG/ML IJ SOLN: 6.2500 mg | INTRAMUSCULAR | Status: DC | PRN

## 2019-05-02 MED ORDER — ROCURONIUM BROMIDE 10 MG/ML (PF) SYRINGE
PREFILLED_SYRINGE | INTRAVENOUS | Status: DC | PRN
  Administered 2019-05-02: 20 mg via INTRAVENOUS
  Administered 2019-05-02 (×2): 10 mg via INTRAVENOUS
  Administered 2019-05-02: 60 mg via INTRAVENOUS
  Administered 2019-05-02: 10 mg via INTRAVENOUS

## 2019-05-02 MED ORDER — CIPROFLOXACIN IN D5W 400 MG/200ML IV SOLN: 400.0000 mg | INTRAVENOUS | Status: DC

## 2019-05-02 MED ORDER — OXYBUTYNIN CHLORIDE 5 MG PO TABS: 5.0000 mg | ORAL_TABLET | Freq: Three times a day (TID) | ORAL | Status: DC | PRN

## 2019-05-02 MED ORDER — ONDANSETRON HCL 4 MG/2ML IJ SOLN
INTRAMUSCULAR | Status: AC
  Filled 2019-05-02: qty 2

## 2019-05-02 MED ORDER — MIDAZOLAM HCL 2 MG/2ML IJ SOLN
INTRAMUSCULAR | Status: DC | PRN
  Administered 2019-05-02: 2 mg via INTRAVENOUS

## 2019-05-02 MED ORDER — PHENYLEPHRINE 40 MCG/ML (10ML) SYRINGE FOR IV PUSH (FOR BLOOD PRESSURE SUPPORT)
PREFILLED_SYRINGE | INTRAVENOUS | Status: AC
  Filled 2019-05-02: qty 10

## 2019-05-02 MED ORDER — PHENYLEPHRINE HCL-NACL 10-0.9 MG/250ML-% IV SOLN
INTRAVENOUS | Status: DC | PRN
  Administered 2019-05-02: 20 ug/min via INTRAVENOUS

## 2019-05-02 MED ORDER — LEVOTHYROXINE SODIUM 75 MCG PO TABS
175.0000 ug | ORAL_TABLET | Freq: Every day | ORAL | Status: DC
  Administered 2019-05-03: 175 ug via ORAL
  Filled 2019-05-02: qty 1

## 2019-05-02 MED ORDER — SODIUM CHLORIDE 0.45 % IV SOLN
INTRAVENOUS | Status: DC
  Administered 2019-05-03: 07:00:00 via INTRAVENOUS

## 2019-05-02 MED ORDER — HYDROMORPHONE HCL 1 MG/ML IJ SOLN
0.5000 mg | INTRAMUSCULAR | Status: DC | PRN
  Administered 2019-05-02 – 2019-05-03 (×3): 1 mg via INTRAVENOUS
  Filled 2019-05-02 (×3): qty 1

## 2019-05-02 MED ORDER — LACTATED RINGERS IV SOLN
INTRAVENOUS | Status: DC
  Administered 2019-05-02: 09:00:00 via INTRAVENOUS

## 2019-05-02 MED ORDER — LIDOCAINE HCL 1 % IJ SOLN
INTRAMUSCULAR | Status: AC
  Filled 2019-05-02: qty 20

## 2019-05-02 MED ORDER — IRBESARTAN 300 MG PO TABS
300.0000 mg | ORAL_TABLET | Freq: Every day | ORAL | Status: DC
  Administered 2019-05-02 – 2019-05-03 (×2): 300 mg via ORAL
  Filled 2019-05-02 (×2): qty 1

## 2019-05-02 MED ORDER — IOHEXOL 300 MG/ML  SOLN
50.0000 mL | Freq: Once | INTRAMUSCULAR | Status: AC | PRN
  Administered 2019-05-02: 15 mL

## 2019-05-02 MED ORDER — ALPRAZOLAM 0.5 MG PO TABS
0.5000 mg | ORAL_TABLET | Freq: Once | ORAL | Status: AC
  Administered 2019-05-02: 0.5 mg via ORAL
  Filled 2019-05-02: qty 1

## 2019-05-02 MED ORDER — SULFAMETHOXAZOLE-TRIMETHOPRIM 800-160 MG PO TABS
1.0000 | ORAL_TABLET | Freq: Two times a day (BID) | ORAL | Status: DC
  Administered 2019-05-02 – 2019-05-03 (×2): 1 via ORAL
  Filled 2019-05-02 (×2): qty 1

## 2019-05-02 MED ORDER — FENTANYL CITRATE (PF) 250 MCG/5ML IJ SOLN
INTRAMUSCULAR | Status: DC | PRN
  Administered 2019-05-02: 50 ug via INTRAVENOUS

## 2019-05-02 MED ORDER — SODIUM CHLORIDE 0.9 % IR SOLN
Status: DC | PRN
  Administered 2019-05-02: 15000 mL

## 2019-05-02 MED ORDER — SCOPOLAMINE 1 MG/3DAYS TD PT72: 1.0000 | MEDICATED_PATCH | TRANSDERMAL | Status: DC

## 2019-05-02 MED ORDER — FENTANYL CITRATE (PF) 100 MCG/2ML IJ SOLN
50.0000 ug | Freq: Once | INTRAMUSCULAR | Status: AC
  Administered 2019-05-02: 50 ug via INTRAVENOUS

## 2019-05-02 MED ORDER — ACETAMINOPHEN 160 MG/5ML PO SOLN: 325.0000 mg | ORAL | Status: DC | PRN

## 2019-05-02 MED ORDER — OXYCODONE HCL 5 MG/5ML PO SOLN: 5.0000 mg | Freq: Once | ORAL | Status: DC | PRN

## 2019-05-02 MED ORDER — FENTANYL CITRATE (PF) 100 MCG/2ML IJ SOLN
INTRAMUSCULAR | Status: AC
  Administered 2019-05-02: 50 ug
  Filled 2019-05-02: qty 2

## 2019-05-02 MED ORDER — PROPOFOL 10 MG/ML IV BOLUS
INTRAVENOUS | Status: DC | PRN
  Administered 2019-05-02: 80 mg via INTRAVENOUS

## 2019-05-02 MED ORDER — ACETAMINOPHEN 325 MG PO TABS: 650.0000 mg | ORAL_TABLET | ORAL | Status: DC | PRN

## 2019-05-02 MED ORDER — MIDAZOLAM HCL 2 MG/2ML IJ SOLN
INTRAMUSCULAR | Status: AC
  Filled 2019-05-02: qty 2

## 2019-05-02 MED ORDER — FENTANYL CITRATE (PF) 100 MCG/2ML IJ SOLN
INTRAMUSCULAR | Status: AC | PRN
  Administered 2019-05-02: 50 ug via INTRAVENOUS

## 2019-05-02 MED ORDER — FENTANYL CITRATE (PF) 100 MCG/2ML IJ SOLN
INTRAMUSCULAR | Status: AC
  Filled 2019-05-02: qty 4

## 2019-05-02 MED ORDER — FAMOTIDINE 20 MG PO TABS
20.0000 mg | ORAL_TABLET | Freq: Every day | ORAL | Status: DC
  Administered 2019-05-03: 20 mg via ORAL
  Filled 2019-05-02: qty 1

## 2019-05-02 SURGICAL SUPPLY — 57 items
AGENT HMST KT MTR STRL THRMB (HEMOSTASIS) ×1
APL ESCP 34 STRL LF DISP (HEMOSTASIS) ×1
APL PRP STRL LF DISP 70% ISPRP (MISCELLANEOUS) ×1
APL SKNCLS STERI-STRIP NONHPOA (GAUZE/BANDAGES/DRESSINGS) ×1
APPLICATOR SURGIFLO ENDO (HEMOSTASIS) ×1 IMPLANT
BAG DRN RND TRDRP ANRFLXCHMBR (UROLOGICAL SUPPLIES)
BAG URINE DRAIN 2000ML AR STRL (UROLOGICAL SUPPLIES) IMPLANT
BASKET ZERO TIP NITINOL 2.4FR (BASKET) ×2 IMPLANT
BENZOIN TINCTURE PRP APPL 2/3 (GAUZE/BANDAGES/DRESSINGS) ×3 IMPLANT
BLADE SURG 15 STRL LF DISP TIS (BLADE) ×1 IMPLANT
BLADE SURG 15 STRL SS (BLADE) ×2
BSKT STON RTRVL ZERO TP 2.4FR (BASKET) ×1
CATH FOLEY 2W COUNCIL 20FR 5CC (CATHETERS) IMPLANT
CATH ROBINSON RED A/P 20FR (CATHETERS) IMPLANT
CATH X-FORCE N30 NEPHROSTOMY (TUBING) ×2 IMPLANT
CHLORAPREP W/TINT 26 (MISCELLANEOUS) ×2 IMPLANT
COVER SURGICAL LIGHT HANDLE (MISCELLANEOUS) ×2 IMPLANT
COVER WAND RF STERILE (DRAPES) IMPLANT
DRAPE C-ARM 42X120 X-RAY (DRAPES) ×2 IMPLANT
DRAPE LINGEMAN PERC (DRAPES) ×2 IMPLANT
DRAPE SURG IRRIG POUCH 19X23 (DRAPES) ×2 IMPLANT
DRSG PAD ABDOMINAL 8X10 ST (GAUZE/BANDAGES/DRESSINGS) IMPLANT
DRSG TEGADERM 8X12 (GAUZE/BANDAGES/DRESSINGS) ×4 IMPLANT
EXTRACTOR STONE 1.7FRX115CM (UROLOGICAL SUPPLIES) ×2 IMPLANT
FIBER LASER FLEXIVA 1000 (UROLOGICAL SUPPLIES) IMPLANT
FIBER LASER FLEXIVA 365 (UROLOGICAL SUPPLIES) IMPLANT
FIBER LASER FLEXIVA 550 (UROLOGICAL SUPPLIES) IMPLANT
FIBER LASER TRAC TIP (UROLOGICAL SUPPLIES) ×1 IMPLANT
GAUZE SPONGE 4X4 12PLY STRL (GAUZE/BANDAGES/DRESSINGS) IMPLANT
GLOVE BIOGEL M 8.0 STRL (GLOVE) ×5 IMPLANT
GOWN STRL REUS W/TWL XL LVL3 (GOWN DISPOSABLE) ×3 IMPLANT
GUIDEWIRE AMPLAZ .035X145 (WIRE) ×4 IMPLANT
GUIDEWIRE ANG ZIPWIRE 038X150 (WIRE) ×1 IMPLANT
GUIDEWIRE STR DUAL SENSOR (WIRE) ×2 IMPLANT
KIT BASIN OR (CUSTOM PROCEDURE TRAY) ×2 IMPLANT
KIT PROBE 340X3.4XDISP GRN (MISCELLANEOUS) IMPLANT
KIT PROBE TRILOGY 3.4X340 (MISCELLANEOUS)
KIT PROBE TRILOGY 3.9X350 (MISCELLANEOUS) ×1 IMPLANT
KIT TURNOVER KIT A (KITS) IMPLANT
MANIFOLD NEPTUNE II (INSTRUMENTS) ×2 IMPLANT
NS IRRIG 1000ML POUR BTL (IV SOLUTION) ×2 IMPLANT
PACK CYSTO (CUSTOM PROCEDURE TRAY) ×2 IMPLANT
SHEATH PEELAWAY SET 9 (SHEATH) ×2 IMPLANT
SHEATH URETERAL 12FRX35CM (MISCELLANEOUS) ×1 IMPLANT
SPONGE LAP 4X18 RFD (DISPOSABLE) ×2 IMPLANT
STENT CONTOUR 6FRX26X.038 (STENTS) ×1 IMPLANT
SURGIFLO W/THROMBIN 8M KIT (HEMOSTASIS) ×1 IMPLANT
SUT SILK 2 0 30  PSL (SUTURE) ×1
SUT SILK 2 0 30 PSL (SUTURE) ×1 IMPLANT
SYR 10ML LL (SYRINGE) ×2 IMPLANT
SYR 20ML LL LF (SYRINGE) ×4 IMPLANT
TRAY FOLEY MTR SLVR 14FR STAT (SET/KITS/TRAYS/PACK) IMPLANT
TRAY FOLEY MTR SLVR 16FR STAT (SET/KITS/TRAYS/PACK) ×2 IMPLANT
TUBING CONNECTING 10 (TUBING) ×4 IMPLANT
TUBING STONE CATCHER TRILOGY (MISCELLANEOUS) IMPLANT
TUBING UROLOGY SET (TUBING) ×2 IMPLANT
WATER STERILE IRR 1000ML POUR (IV SOLUTION) ×2 IMPLANT

## 2019-05-02 NOTE — Procedures (Signed)
Pre Procedure Dx: Right sided nephrolithiasis Post Procedural Dx: Same  Successful [place] and fluoroscopic guided placement of a right sided 5 Fr catheter with end coiled terminating within the urinary bladder to be utilized for impending PCNL procedure.  EBL: Minimal Complications: None immediate.  Ronny Bacon, MD Pager #: [phone]

## 2019-05-02 NOTE — Transfer of Care (Signed)
Immediate Anesthesia Transfer of Care Note  Patient: Patricia [name]  Procedure(s) Performed: NEPHROLITHOTOMY PERCUTANEOUS (Right ) HOLMIUM LASER APPLICATION (Right )  Patient Location: PACU  Anesthesia Type:General  Level of Consciousness: awake, alert  and oriented  Airway & Oxygen Therapy: Patient Spontanous Breathing and Patient connected to face mask oxygen  Post-op Assessment: Report given to RN and Post -op Vital signs reviewed and stable  Post vital signs: Reviewed and stable  Last Vitals:  Vitals Value Taken Time  BP 148/81 05/02/19 1516  Temp    Pulse 95 05/02/19 1518  Resp 12 05/02/19 1518  SpO2 99 % 05/02/19 1518  Vitals shown include unvalidated device data.  Last Pain: There were no vitals filed for this visit.       Complications: No apparent anesthesia complications

## 2019-05-02 NOTE — H&P (Signed)
H&P  Chief Complaint: Right-sided kidney stone  History of Present Illness: Patricia [name] is a 68 y.o. year old female  Presenting for percutaneous management of a 21 mm right renal calculus as well as possible percutaneous access ureteroscopy for distal ureteral stone  on the right as well  Past Medical History:  Diagnosis Date  . Arthritis   . Diabetes mellitus without complication (HCC)   . Hypertension   . Hypothyroidism   . PONV (postoperative nausea and vomiting)     Past Surgical History:  Procedure Laterality Date  . ABDOMINAL HYSTERECTOMY    . CHOLECYSTECTOMY N/A 03/02/2015   Procedure: LAPAROSCOPIC CHOLECYSTECTOMY;  Surgeon: Franky Macho, MD;  Location: [facility];  Service: General;  Laterality: N/A;  . KNEE ARTHROSCOPY Right   . LIVER BIOPSY  03/02/2015   Procedure: LAPAROSCOPIC LIVER BIOPSY;  Surgeon: Franky Macho, MD;  Location: [facility];  Service: General;;  . THYROIDECTOMY    . TONSILLECTOMY  1970  . TUBAL LIGATION      Home Medications:  Medications Prior to Admission  Medication Sig Dispense Refill  . APPLE CIDER VINEGAR PO Take 1 tablet by mouth daily.    . cetirizine (ZYRTEC) 10 MG tablet Take 10 mg by mouth daily.    Marland Kitchen CINNAMON PO Take 1,000 mg by mouth daily.    . cyclobenzaprine (FLEXERIL) 10 MG tablet Take 10 mg by mouth 3 (three) times daily as needed for muscle spasms.    Marland Kitchen levothyroxine (SYNTHROID, LEVOTHROID) 175 MCG tablet Take 175 mcg by mouth daily before breakfast.    . meloxicam (MOBIC) 15 MG tablet Take 15 mg by mouth as needed.     . metFORMIN (GLUCOPHAGE) 500 MG tablet Take 500 mg by mouth 2 (two) times daily with a meal.    . naproxen sodium (ANAPROX) 220 MG tablet Take 220 mg by mouth 2 (two) times daily with a meal.    . nitrofurantoin (MACRODANTIN) 50 MG capsule Take 50 mg by mouth 4 (four) times daily.    . Omega-3 Fatty Acids (FISH OIL) 1000 MG CAPS Take 1 capsule by mouth daily.    Marland Kitchen oxyCODONE-acetaminophen (PERCOCET) 7.5-325 MG per  tablet Take 1-2 tablets by mouth every 4 (four) hours as needed. 50 tablet 0  . predniSONE (DELTASONE) 10 MG tablet Take 10 mg by mouth daily with breakfast.    . ranitidine (ZANTAC) 150 MG tablet Take 150 mg by mouth daily.    . simvastatin (ZOCOR) 20 MG tablet Take 20 mg by mouth daily.    . valsartan (DIOVAN) 320 MG tablet Take 320 mg by mouth daily.      Allergies:  Allergies  Allergen Reactions  . Amlodipine     Swelling  . Keflex [Cephalexin] Rash    No family history on file.  Social History:  reports that she has never smoked. She has never used smokeless tobacco. She reports that she does not drink alcohol or use drugs.  ROS: A complete review of systems was performed.  All systems are negative except for pertinent findings as noted.  Physical Exam:  Vital signs in last 24 hours: Temp:  [98.7 F (37.1 C)] 98.7 F (37.1 C) (11/12 0809) Pulse Rate:  [117] 117 (11/12 0809) Resp:  [20] 20 (11/12 0809) BP: (129)/(95) 129/95 (11/12 0809) SpO2:  [100 %] 100 % (11/12 0809) General:  Alert and oriented, No acute distress HEENT: Normocephalic, atraumatic Neck: No JVD or lymphadenopathy Cardiovascular: Regular rate and rhythm Lungs: Clear bilaterally  Abdomen: Soft, nontender, nondistended, no abdominal masses Back: No CVA tenderness Extremities: No edema Neurologic: Grossly intact  Laboratory Data:  No results found for this or any previous visit (from the past 24 hour(s)). Recent Results (from the past 240 hour(s))  SARS CORONAVIRUS 2 (TAT 6-24 HRS)     Status: None   Collection Time: 04/29/19 11:00 AM  Result Value Ref Range Status   SARS Coronavirus 2 NEGATIVE NEGATIVE Final    Comment: (NOTE) SARS-CoV-2 target nucleic acids are NOT DETECTED. The SARS-CoV-2 RNA is generally detectable in upper and lower respiratory specimens during the acute phase of infection. Negative results do not preclude SARS-CoV-2 infection, do not rule out co-infections with other  pathogens, and should not be used as the sole basis for treatment or other patient management decisions. Negative results must be combined with clinical observations, patient history, and epidemiological information. The expected result is Negative. Fact Sheet for Patients: SugarRoll.be Fact Sheet for Healthcare Providers: https://www.woods-mathews.com/ This test is not yet approved or cleared by the [place] FDA and  has been authorized for detection and/or diagnosis of SARS-CoV-2 by FDA under an Emergency Use Authorization (EUA). This EUA will remain  in effect (meaning this test can be used) for the duration of the COVID-19 declaration under Section 56 4(b)(1) of the Act, 21 U.S.C. section 360bbb-3(b)(1), unless the authorization is terminated or revoked sooner. Performed at [facility], [place] [address], [place], [place] [address]    Creatinine: Recent Labs    04/26/19 1512  CREATININE 0.73    Radiologic Imaging: No results found.  Impression/Assessment:   21 mm right renal stone, probable right distal ureteral stone  Plan:   percutaneous management of right renal calculus, right distal ureteral calculus  Lillette Boxer Ailish Prospero 05/02/2019, 8:22 AM  Lillette Boxer. Harvy Riera MD

## 2019-05-02 NOTE — Interval H&P Note (Signed)
History and Physical Interval Note:  05/02/2019 12:44 PM  Patricia [name]  has presented today for surgery, with the diagnosis of RIGHT RENAL AND URETERAL CALCULI.  The various methods of treatment have been discussed with the patient and family. After consideration of risks, benefits and other options for treatment, the patient has consented to  Procedure(s) with comments: NEPHROLITHOTOMY PERCUTANEOUS (Right) - 3 HRS HOLMIUM LASER APPLICATION (Right) as a surgical intervention.  The patient's history has been reviewed, patient examined, no change in status, stable for surgery.  I have reviewed the patient's chart and labs.  Questions were answered to the patient's satisfaction.     Lillette Boxer Marcie Shearon

## 2019-05-02 NOTE — Discharge Instructions (Signed)

## 2019-05-02 NOTE — Plan of Care (Signed)
Call bell and All belongings within reach. Pt will call for assistance when needing to get out of bed.

## 2019-05-02 NOTE — Anesthesia Procedure Notes (Signed)
Procedure Name: Intubation Date/Time: 05/02/2019 12:57 PM Performed by: Eben Burow, CRNA Pre-anesthesia Checklist: Patient identified, Emergency Drugs available, Suction available, Patient being monitored and Timeout performed Patient Re-evaluated:Patient Re-evaluated prior to induction Oxygen Delivery Method: Circle system utilized Preoxygenation: Pre-oxygenation with 100% oxygen Induction Type: IV induction Ventilation: Mask ventilation without difficulty Laryngoscope Size: Mac and 4 Grade View: Grade I Tube type: Oral Tube size: 7.0 mm Number of attempts: 1 Airway Equipment and Method: Stylet Placement Confirmation: ETT inserted through vocal cords under direct vision,  positive ETCO2 and breath sounds checked- equal and bilateral Secured at: 21 cm Tube secured with: Tape Dental Injury: Teeth and Oropharynx as per pre-operative assessment

## 2019-05-02 NOTE — Anesthesia Preprocedure Evaluation (Signed)
Anesthesia Evaluation  Patient identified by MRN, date of birth, ID band Patient awake    Reviewed: Allergy & Precautions, H&P , NPO status , Patient's Chart, lab work & pertinent test results  History of Anesthesia Complications (+) PONV and history of anesthetic complications  Airway Mallampati: I  TM Distance: >3 FB     Dental  (+) Teeth Intact, Dental Advisory Given   Pulmonary neg pulmonary ROS,    breath sounds clear to auscultation       Cardiovascular hypertension, Pt. on medications  Rhythm:Regular Rate:Normal     Neuro/Psych    GI/Hepatic GERD  Medicated and Controlled,  Endo/Other  diabetesHypothyroidism   Renal/GU      Musculoskeletal   Abdominal   Peds  Hematology   Anesthesia Other Findings   Reproductive/Obstetrics                             Anesthesia Physical  Anesthesia Plan  ASA: III  Anesthesia Plan: General   Post-op Pain Management:    Induction: Intravenous  PONV Risk Score and Plan: 4 or greater and Dexamethasone, Treatment may vary due to age or medical condition, Scopolamine patch - Pre-op and Midazolam  Airway Management Planned: Oral ETT and LMA  Additional Equipment:   Intra-op Plan:   Post-operative Plan: Extubation in OR  Informed Consent: I have reviewed the patients History and Physical, chart, labs and discussed the procedure including the risks, benefits and alternatives for the proposed anesthesia with the patient or authorized representative who has indicated his/her understanding and acceptance.       Plan Discussed with: Anesthesiologist, Surgeon and CRNA  Anesthesia Plan Comments:         Anesthesia Quick Evaluation

## 2019-05-02 NOTE — H&P (Signed)
Chief Complaint: Patient was seen in consultation today for right renal and ureteral calculi/right ureteral stent placement.  Referring Physician(s): Dahlstedt,Stephen  Supervising Physician: Simonne Come  Patient Status: [facility] - Out-pt  History of Present Illness: Patricia [name] is [name] 68 y.o. female with [name] past medical history of hypertension, nephrolithiasis, hypothyroidism, diabetes mellitus, and arthritis. She has [name] right renal and right distal ureteral calculus, managed by Dr. Retta Diones. She has tentative plans for percutaneous lithotripsy in OR with Dr. Retta Diones.  IR requested by Dr. Retta Diones for possible image-guided percutaneous right ureteral stent placement for access prior to percutaneous lithotripsy. Patient awake and alert laying in bed. Complains of low back pain, stable at this time. Denies fever, chills, chest pain, dyspnea, abdominal pain, or headache.   Past Medical History:  Diagnosis Date   Arthritis    Diabetes mellitus without complication (HCC)    Hypertension    Hypothyroidism    PONV (postoperative nausea and vomiting)     Past Surgical History:  Procedure Laterality Date   ABDOMINAL HYSTERECTOMY     CHOLECYSTECTOMY N/[name] 03/02/2015   Procedure: LAPAROSCOPIC CHOLECYSTECTOMY;  Surgeon: Franky Macho, MD;  Location: [facility];  Service: General;  Laterality: N/[name];   KNEE ARTHROSCOPY Right    LIVER BIOPSY  03/02/2015   Procedure: LAPAROSCOPIC LIVER BIOPSY;  Surgeon: Franky Macho, MD;  Location: [facility];  Service: General;;   THYROIDECTOMY     TONSILLECTOMY  1970   TUBAL LIGATION      Allergies: Amlodipine and Keflex [cephalexin]  Medications: Prior to Admission medications   Medication Sig Start Date End Date Taking? Authorizing Provider  APPLE CIDER VINEGAR PO Take 1 tablet by mouth daily.    [provider]  cetirizine (ZYRTEC) 10 MG tablet Take 10 mg by mouth daily.    [provider]  CINNAMON PO Take 1,000 mg by  mouth daily.    [provider]  cyclobenzaprine (FLEXERIL) 10 MG tablet Take 10 mg by mouth 3 (three) times daily as needed for muscle spasms.    [provider]  levothyroxine (SYNTHROID, LEVOTHROID) 175 MCG tablet Take 175 mcg by mouth daily before breakfast.    [provider]  meloxicam (MOBIC) 15 MG tablet Take 15 mg by mouth as needed.     [provider]  metFORMIN (GLUCOPHAGE) 500 MG tablet Take 500 mg by mouth 2 (two) times daily with [name] meal.    [provider]  naproxen sodium (ANAPROX) 220 MG tablet Take 220 mg by mouth 2 (two) times daily with [name] meal.    [provider]  nitrofurantoin (MACRODANTIN) 50 MG capsule Take 50 mg by mouth 4 (four) times daily. 04/11/19   [provider]  Omega-3 Fatty Acids (FISH OIL) 1000 MG CAPS Take 1 capsule by mouth daily.    [provider]  oxyCODONE-acetaminophen (PERCOCET) 7.5-325 MG per tablet Take 1-2 tablets by mouth every 4 (four) hours as needed. 03/02/15   Franky Macho, MD  predniSONE (DELTASONE) 10 MG tablet Take 10 mg by mouth daily with breakfast.    [provider]  ranitidine (ZANTAC) 150 MG tablet Take 150 mg by mouth daily.    [provider]  simvastatin (ZOCOR) 20 MG tablet Take 20 mg by mouth daily.    [provider]  valsartan (DIOVAN) 320 MG tablet Take 320 mg by mouth daily.    [provider]     No family history on file.  Social History  Socioeconomic History   Marital status: Widowed    Spouse name: Not on file   Number of children: Not on file   Years of education: Not on file   Highest education level: Not on file  Occupational History   Not on file  Social Needs   Financial resource strain: Not on file   Food insecurity    Worry: Not on file    Inability: Not on file   Transportation needs    Medical: Not on file    Non-medical: Not on file  Tobacco Use   Smoking status: Never Smoker    Smokeless tobacco: Never Used  Substance and Sexual Activity   Alcohol use: No   Drug use: No   Sexual activity: Not on file  Lifestyle   Physical activity    Days per week: Not on file    Minutes per session: Not on file   Stress: Not on file  Relationships   Social connections    Talks on phone: Not on file    Gets together: Not on file    Attends religious service: Not on file    Active member of club or organization: Not on file    Attends meetings of clubs or organizations: Not on file    Relationship status: Not on file  Other Topics Concern   Not on file  Social History Narrative   Not on file     Review of Systems: [name] 12 point ROS discussed and pertinent positives are indicated in the HPI above.  All other systems are negative.  Review of Systems  Constitutional: Negative for chills and fever.  Respiratory: Negative for shortness of breath and wheezing.   Cardiovascular: Negative for chest pain and palpitations.  Gastrointestinal: Negative for abdominal pain.  Musculoskeletal: Positive for back pain.  Neurological: Negative for headaches.  Psychiatric/Behavioral: Negative for behavioral problems and confusion.    Vital Signs: There were no vitals taken for this visit.  Physical Exam Vitals signs and nursing note reviewed.  Constitutional:      General: She is not in acute distress.    Appearance: Normal appearance.  Cardiovascular:     Rate and Rhythm: Normal rate and regular rhythm.     Heart sounds: Normal heart sounds. No murmur.  Pulmonary:     Effort: Pulmonary effort is normal. No respiratory distress.     Breath sounds: Normal breath sounds. No wheezing.  Skin:    General: Skin is warm and dry.  Neurological:     Mental Status: She is alert and oriented to person, place, and time.  Psychiatric:        Mood and Affect: Mood normal.        Behavior: Behavior normal.        Thought Content: Thought content normal.        Judgment: Judgment  normal.      MD Evaluation Airway: WNL Heart: WNL Abdomen: WNL Chest/ Lungs: WNL ASA  Classification: 2 Mallampati/Airway Score: Two   Imaging: Dg Abd 1 View  Result Date: 04/10/2019 CLINICAL DATA:  68 year old female with right renal calculus. EXAM: ABDOMEN - 1 VIEW COMPARISON:  CT Abdomen and Pelvis 03/01/2019. FINDINGS: Persistent 9 millimeter distal ureteral calculus projecting in the right hemipelvis, with several superimposed phleboliths. Position appears unchanged from the scalp view 03/01/2019. Large superimposed 26 millimeter calculus of the right renal pelvis also appears stable. No new urinary calculus. Stable cholecystectomy clips. Non obstructed bowel gas pattern. No acute osseous  abnormality identified. IMPRESSION: 1. Unchanged 9 mm distal right ureteral calculus from the CT 03/01/2019. 2. Stable large 26 mm right renal pelvis calculus. 3. Superimposed pelvic phleboliths. No new urinary calculus identified. Electronically Signed   By: Odessa FlemingH  Hall M.D.   On: 04/10/2019 08:38   Dg Bone Density  Result Date: 04/02/2019 EXAM: DUAL X-RAY ABSORPTIOMETRY (DXA) FOR BONE MINERAL DENSITY IMPRESSION: Your patient Patricia [name] completed [name] BMD test on 04/02/2019 using the [employer] DXA System (software version: 14.10) manufactured by [employer] LUNAR. The following summarizes the results of our evaluation. Technologist::TNB PATIENT BIOGRAPHICAL: Name: Patricia [name], Patricia [name] Patient ID: [id number] Birth Date: [birth year] Height: 65.0 in. Gender: Female Exam Date: 04/02/2019 Weight: 179.0 lbs. Indications: Caucasian, Low Calcium Intake, Parent Hip Fracture, Partial Hysterectomy, Post Menopausal Fractures: Treatments: DENSITOMETRY RESULTS: Site         Region     Measured Date Measured Age WHO Classification Young Adult T-score BMD         %Change vs. Previous Significant Change (*) DualFemur Neck Left 4/02/2019 68.0 Normal -0.5 0.968 g/cm2 Left Forearm Radius 33% 04/02/2019 68.0 Normal  -0.3 0.693 g/cm2 ASSESSMENT: The BMD measured at Femur Neck Left is 0.968 g/cm2 with [name] T-score of -0.5. This patient is considered normal according to World Health Organization [facility](WHO) criteria. Per official position of the ISCD, it is not possible to quantitatively compare BMD or calculate [name] LSC between different facilities or devices. Lumbar spine was excluded due to advanced degenerative changes. Patient is not [name] candidate for FRAX assessment due to normal bone density exam. World Health Organization [facility](WHO) criteria for post-menopausal, Caucasian Women: Normal:       T-score at or above -1 SD Osteopenia:   T-score between -1 and -2.5 SD Osteoporosis: T-score at or below -2.5 SD RECOMMENDATIONS: 1. All patients should optimize calcium and vitamin D intake. 2. Consider FDA-approved medical therapies in postmenopausal women and med aged 68 years and older, based on the following: [name]. [name] hip or vertebral (clinical or morphometric) fracture b. T-score< -2.5 at the femoral neck or spine after appropriate evaluation to exclude secondary causes c. Low bone mass (T-score between -1.0 and -2.5 at the femoral neck or spine) and [name] 10-year probability of [name] hip fracture > 3% or [name] 10-year probability of [name] major osteoporosis-related fracture > 20% based on the [place]-adapted WHO algorithm d. Clinician judgment and/or patient preferences may indicate treatment for people with 10-year fracture probabilities above or below these levels FOLLOW-UP: People with diagnosed cases of osteoporosis or at high risk for fracture should have regular bone mineral density tests. For patients eligible for Medicare, routine testing is allowed once every 2 years. The testing frequency can be increased to one year for patients who have rapidly progressing disease, those who are receiving or discontinuing medical therapy to restore bone mass, or have additional risk factors. I have reviewed this report, and agree with the above findings. [facility]  [facility][name]. Electronically Signed   By: Bretta BangWilliam  Woodruff III M.D.   On: 04/02/2019 14:51    Labs:  CBC: Recent Labs    04/26/19 1512  WBC 14.0*  HGB 14.1  HCT 45.6  PLT 321    COAGS: No results for input(s): INR, APTT in the last 8760 hours.  BMP: Recent Labs    03/01/19 1702 04/26/19 1512  NA  --  140  K  --  4.8  CL  --  103  CO2  --  25  GLUCOSE  --  191*  BUN  --  15  CALCIUM  --  9.8  CREATININE 0.70 0.73  GFRNONAA  --  >60  GFRAA  --  >60     Assessment and Plan:  Right renal and ureteral calculi. Plan for image-guided percutaneous right ureteral stent placement today in IR. Patient to head to OR for percutaneous lithotripsy with Dr. Diona Fanti following placement. Patient is NPO. Afebrile. She does not take blood thinners. INR pending.  Risks and benefits of percutaneous right ureteral stent placement were discussed with the patient including, but not limited to, infection, bleeding, significant bleeding causing loss or decrease in renal function or damage to adjacent structures. All of the patient's questions were answered, patient is agreeable to proceed. Consent signed and in chart.   Thank you for this interesting consult.  I greatly enjoyed meeting Patricia [name] and look forward to participating in their care.  [name] copy of this report was sent to the requesting provider on this date.  Electronically Signed: Earley Abide, PA-C 05/02/2019, 8:42 AM   I spent [name] total of 40 Minutes in face to face in clinical consultation, greater than 50% of which was counseling/coordinating care for right renal and ureteral calculi/right ureteral stent placement.

## 2019-05-02 NOTE — Op Note (Signed)
Preop diagnosis: 21 mm right renal pelvic stone, 9 mm right distal ureteral stone  Postoperative diagnosis: Same  Principal procedure: Percutaneous nephrolithotomy of 21 mm right renal pelvic stone, percutaneous access right ureteroscopy with holmium laser lithotripsy and extraction of 9 mm distal right ureteral stone, placement of 6 French by 26 cm contour double-J stent with out tether  Surgeon: Jarah Pember  Anesthesia: General with endotracheal device  Estimated blood loss: Less than 100 mL  Specimen: Renal pelvic stone, right ureteral stone, both fragmented  Drains: 16 French Foley catheter to dependent drain, 26 cm x 6 French contour double-J stent  Complications: None  Indications: 68 year old female with recent diagnosis of right distal ureteral stone, 9 mm and a 21 mm right renal pelvic stone.  She presented with flank pain.  She presents at this time for percutaneous based management of both of these calculi.  I discussed the procedure of percutaneous nephrolithotomy, ureteroscopy through that same access and holmium laser lithotripsy of her ureteral stone as well as fragmentation of a right renal stone.  Risks and complications were discussed.  These include but are not limited to bleeding, need for transfusion, need for percutaneous drain and or ureteral stent, anesthetic complications, infections, among others.  She understands these and she desires to proceed.  Description of procedure: Patient was properly identified and marked in the holding area.  Prior to my meeting the patient she had had percutaneous access of the right kidney by Dr. Pascal Lux.  She received Cipro in the radiology suite.  She was then transported to the operating room where general anesthetic was administered.  She was eventually placed in the prone position following drainage of her bladder with a Foley catheter.  All pressure points and joints were padded appropriately.  The right flank was prepped and draped  around the indwelling percutaneous drain.  Proper timeout was performed.  Super Stiff guidewire was advanced under fluoroscopic guidance into her bladder.  At this point, the old Kumpe catheter was removed.  Incision was made beside the guidewire, approximately 12 mm.  The subcutaneous tissue was then spread with hemostats.  I then passed the ureteral access catheter, and a second guidewire was passed into the bladder fluoroscopically.  The access catheter was then removed.  I passed the NephroMax balloon fluoroscopically up to the renal pelvic stone.  The balloon was inflated to 20 atm of pressure and once adequately dilated, the nephrostomy access sheath was advanced up to the stone.  The balloon was deflated and removed.  Nephroscope was passed through the access sheath where the stone was identified.  Several small clots were removed.  The trilogy device was then used to fragment the stone using combination of harmonic and pneumatic power.  Stone fragments were then easily removed through the access sheath.  Once all fragments were removed using the rigid scope, the flexible nephroscope was passed.  All calyces were entered, no stone fragments were seen either directly cystoscopically or fluoroscopically.  I then negotiated a medium length 14 French ureteral access catheter distally in the ureter over top of one of the guidewires.  The guidewire was removed and the obturator of the access catheter was removed as well.  The flexible dual-lumen digital ureteroscope was then advanced down the ureter where the solitary right distal ureteral stone was seen.  This was too large to be removed intact.  Was then fragmented with the holmium laser, 200 m fiber, set it energy settings of 0.5 W and 30 Hz.  Once adequate fragmentation was obtained, the nitinol basket was utilized to remove all fragments other than some small sand-like dust.  Once these were removed, no significant fragments were seen within the ureter.   The ureteroscope was then removed.  I then negotiated a guidewire back down the right ureter using nephroscopic guidance.  Once adequately positioned in the bladder, I passed a 26 cm x 6 French double-J stent over top of the guidewire.  Once adequately positioned, the guidewire was removed and good curls were seen both within the bladder and directly in the renal pelvis.  The distance between the calyceal edge and the skin was measured.  I then remove the access sheath.  The trocar for the Surgiflo was then advanced into the nephrostomy tract.  10 mL of Surgiflo was then used to fill the access tract.  Hemostasis was excellent.  At this point, skin edges were reapproximated using a 2-0 silk placed in a horizontal mattress fashion.  Dry dressings were then placed.  The patient was then awakened following return to the supine position.  She was extubated, passed to the recovery room in stable condition, having tolerated the procedure well.

## 2019-05-03 ENCOUNTER — Encounter (HOSPITAL_COMMUNITY): Payer: Self-pay | Admitting: Urology

## 2019-05-03 DIAGNOSIS — N202 Calculus of kidney with calculus of ureter: Secondary | ICD-10-CM | POA: Diagnosis not present

## 2019-05-03 LAB — HIV ANTIBODY (ROUTINE TESTING W REFLEX): HIV Screen 4th Generation wRfx: NONREACTIVE

## 2019-05-03 LAB — HEMOGLOBIN AND HEMATOCRIT, BLOOD
HCT: 37.5 % (ref 36.0–46.0)
Hemoglobin: 11.6 g/dL — ABNORMAL LOW (ref 12.0–15.0)

## 2019-05-03 MED ORDER — OXYBUTYNIN CHLORIDE 5 MG PO TABS: 5.0000 mg | ORAL_TABLET | Freq: Three times a day (TID) | ORAL | 0 refills | Status: DC | PRN

## 2019-05-03 MED ORDER — CHLORHEXIDINE GLUCONATE CLOTH 2 % EX PADS: 6.0000 | MEDICATED_PAD | Freq: Every day | CUTANEOUS | Status: DC

## 2019-05-03 NOTE — Progress Notes (Signed)
No change from am assessment. Pt A&Ox4 ambulatory. Pt had all belongings. Instructions reviewed questions concerns denied.

## 2019-05-03 NOTE — Anesthesia Postprocedure Evaluation (Signed)
Anesthesia Post Note  Patient: Patricia [name]  Procedure(s) Performed: NEPHROLITHOTOMY PERCUTANEOUS (Right ) HOLMIUM LASER APPLICATION (Right )     Patient location during evaluation: PACU Anesthesia Type: General Level of consciousness: awake and alert Pain management: pain level controlled Vital Signs Assessment: post-procedure vital signs reviewed and stable Respiratory status: spontaneous breathing, nonlabored ventilation, respiratory function stable and patient connected to nasal cannula oxygen Cardiovascular status: blood pressure returned to baseline and stable Postop Assessment: no apparent nausea or vomiting Anesthetic complications: no    Last Vitals:  Vitals:   05/02/19 2002 05/03/19 0903  BP: (!) 155/88 (!) 147/74  Pulse: 92 88  Resp: 20   Temp: 36.7 C   SpO2: 91%     Last Pain:  Vitals:   05/03/19 0621  TempSrc:   PainSc: 5                  Chelsey L Woodrum

## 2019-05-03 NOTE — Discharge Summary (Signed)
Patient ID: Patricia [name] MRN: [id number] DOB/AGE: 68-May-1952 68 y.o.  Admit date: 05/02/2019 Discharge date: 05/03/2019  Primary Care Physician:  Sharilyn Sites, MD  Discharge Diagnoses:  Rt renal/ureteral calculi   Consults:  None     Discharge Medications: Allergies as of 05/03/2019      Reactions   Amlodipine    Swelling   Keflex [cephalexin] Rash      Medication List    STOP taking these medications   predniSONE 10 MG tablet Commonly known as: DELTASONE     TAKE these medications   ALPRAZolam 0.5 MG tablet Commonly known as: XANAX Take 0.5 mg by mouth 2 (two) times daily as needed.   APPLE CIDER VINEGAR PO Take 1 tablet by mouth daily.   cetirizine 10 MG tablet Commonly known as: ZYRTEC Take 10 mg by mouth daily.   CINNAMON PO Take 1,000 mg by mouth daily.   cyclobenzaprine 10 MG tablet Commonly known as: FLEXERIL Take 10 mg by mouth 3 (three) times daily as needed for muscle spasms.   Fish Oil 1000 MG Caps Take 1 capsule by mouth daily.   levothyroxine 200 MCG tablet Commonly known as: SYNTHROID Take 200 mcg by mouth daily before breakfast.   meloxicam 15 MG tablet Commonly known as: MOBIC Take 15 mg by mouth as needed.   metFORMIN 500 MG tablet Commonly known as: GLUCOPHAGE Take 500 mg by mouth 2 (two) times daily with a meal.   naproxen sodium 220 MG tablet Commonly known as: ALEVE Take 220 mg by mouth 2 (two) times daily with a meal.   nitrofurantoin 50 MG capsule Commonly known as: MACRODANTIN Take 50 mg by mouth 4 (four) times daily.   olmesartan 40 MG tablet Commonly known as: BENICAR Take 40 mg by mouth daily.   oxybutynin 5 MG tablet Commonly known as: DITROPAN Take 1 tablet (5 mg total) by mouth every 8 (eight) hours as needed for bladder spasms.   oxyCODONE-acetaminophen 7.5-325 MG tablet Commonly known as: Percocet Take 1-2 tablets by mouth every 4 (four) hours as needed.   simvastatin 20 MG tablet Commonly known  as: ZOCOR Take 20 mg by mouth daily.   valsartan 320 MG tablet Commonly known as: DIOVAN Take 320 mg by mouth daily.        Significant Diagnostic Studies:  Dg C-arm 1-60 Min-no Report  Result Date: 05/02/2019 Fluoroscopy was utilized by the requesting physician.  No radiographic interpretation.   Ir Ureteral Stent Right New Access W/o Sep Nephrostomy Cath  Result Date: 05/02/2019 INDICATION: Renal stones, access for right-sided percutaneous nephrolithotomy. EXAM: 1. ULTRASOUND AND FLUOROSCOPIC GUIDED PUNCTURE OF THE RIGHT SIDED RENAL COLLECTING SYSTEM. 2. FLUOROSCOPIC GUIDED PLACEMENT OF A RIGHT SIDED NEPHROURETERAL CATHETER. COMPARISON:  CT of the abdomen and pelvis - 03/01/2019 MEDICATIONS: Ciprofloxacin 400 mg IV; The antibiotic was administered in an appropriate time frame prior to skin puncture. ANESTHESIA/SEDATION: Moderate (conscious) sedation was employed during this procedure. A total of Versed 2 mg and Fentanyl 50 mcg was administered intravenously. Moderate Sedation Time: 14 minutes. The patient's level of consciousness and vital signs were monitored continuously by radiology nursing throughout the procedure under my direct supervision. CONTRAST:  20mL OMNIPAQUE IOHEXOL 300 MG/ML SOLN - Administered into the renal collecting system. FLUOROSCOPY TIME:  1 minute, 54 seconds (65 mGy) COMPLICATIONS: None immediate. PROCEDURE: Informed written consent was obtained from the patient after a discussion of the risks, benefits, and alternatives to treatment. The flank flank region was prepped with Betadine in  a sterile fashion, and a sterile drape was applied covering the operative field. A sterile gown and sterile gloves were used for the procedure. A timeout was performed prior to the initiation of the procedure. A pre procedural spot fluoroscopic image was obtained of the upper abdomen. Ultrasound scanning performed of the kidney was negative for significant hydronephrosis. Regardless, a  nondilated posteroinferior calyx was targeted with a 22 gauge Chiba needle under direct ultrasound guidance. Contrast injection confirmed appropriate access to the right renal collecting system. Next, a Nitrex wire was advanced through the calyx and infundibulum past the nonobstructing pelvic stone to the level the superior aspect of the right ureter. An Accustick set was utilized to dilate the tract and was subsequently exchanged for a Kumpe catheter over a Bentson wire. The Kumpe catheter was advanced down the ureter and past the nonobstructing distal right ureteral stone and into the urinary bladder. Postprocedural spot radiographs were obtained in various obliquities and the catheter was sutured to the skin. The catheter was capped and a dressing was placed. The patient tolerated the procedure well without immediate post procedural complication. FINDINGS: Pre procedural spot radiographic images demonstrates unchanged size and appearance of the approximately 2.1 x 1.6 cm known right pelvic renal stone. Ultrasound scanning was negative for significant hydronephrosis however a non dilated posteroinferior calyx was successfully accessed under direct ultrasound guidance allowing placement of a 5 [place] Kumpe catheter through the accessed posteroinferior calyx, past right pelvic stone as well as the approximately 0.8 x 0.4 cm nonobstructing stone within distal aspect the right ureter with end ultimately position within the urinary bladder. Note is also made of a prominent phlebolith within the caudal aspect the left hemipelvis as demonstrated on preceding abdominal and pelvic CT. IMPRESSION: Successful ultrasound and fluoroscopic guided placement of a right sided 5 [place] Kumpe catheter to the level of the urinary bladder past both the known pelvic and distal right ureteral stones to be utilized during impending nephrolithotomy procedure. Electronically Signed   By: Simonne Come M.D.   On: 05/02/2019 10:40    Brief  H and P: For complete details please refer to admission H and P, but in brief pt admitted for pcn mgmt of rt renal/ureteral stones  Hospital Course: Postop course uncomplicated, pt went home on pod 1 Active Problems:   Calculus of kidney   Day of Discharge BP (!) 147/74 (BP Location: Right Arm)   Pulse 88   Temp 98 F (36.7 C) (Oral)   Resp 20   Ht 5\' 4"  (1.626 m)   Wt 84.5 kg   SpO2 91%   BMI 31.98 kg/m   Results for orders placed or performed during the hospital encounter of 05/02/19 (from the past 24 hour(s))  HIV Antibody (routine testing w rflx)     Status: None   Collection Time: 05/03/19  5:09 AM  Result Value Ref Range   HIV Screen 4th Generation wRfx NON REACTIVE NON REACTIVE  Hemoglobin and hematocrit, blood     Status: Abnormal   Collection Time: 05/03/19  5:09 AM  Result Value Ref Range   Hemoglobin 11.6 (L) 12.0 - 15.0 g/dL   HCT 05/05/19 84.1 - 66.0 %    Physical Exam: General: Alert and awake oriented x3 not in any acute distress. HEENT: anicteric sclera, pupils reactive to light and accommodation CVS: S1-S2 clear no murmur rubs or gallops Chest: clear to auscultation bilaterally, no wheezing rales or rhonchi Abdomen: soft nontender, nondistended, normal bowel sounds, no organomegaly Extremities:  no cyanosis, clubbing or edema noted bilaterally Neuro: Cranial nerves II-XII intact, no focal neurological deficits  Disposition:  hOME  Diet:  rEGULAR  Activity:  gRADUALLY INCREASE   Disposition and Follow-up:     F/U ARRANGED  TESTS THAT NEED FOLLOW-UP   N/A  DISCHARGE FOLLOW-UP  Follow-up Information    [facility] [place].   Specialty: Urology Why: 12.1.2020 @ 11 am Contact information: [address] [place] [id number] [phone]          Time spent on Discharge:   10 mins  Signed: Bertram MillardStephen M Cedric Denison 05/03/2019, 5:45 PM

## 2019-05-21 ENCOUNTER — Ambulatory Visit (INDEPENDENT_AMBULATORY_CARE_PROVIDER_SITE_OTHER): Payer: PPO | Admitting: Urology

## 2019-05-21 DIAGNOSIS — E6609 Other obesity due to excess calories: Secondary | ICD-10-CM | POA: Diagnosis not present

## 2019-05-21 DIAGNOSIS — N200 Calculus of kidney: Secondary | ICD-10-CM

## 2019-05-21 DIAGNOSIS — E7849 Other hyperlipidemia: Secondary | ICD-10-CM | POA: Diagnosis not present

## 2019-05-21 DIAGNOSIS — Z6830 Body mass index (BMI) 30.0-30.9, adult: Secondary | ICD-10-CM | POA: Diagnosis not present

## 2019-05-21 DIAGNOSIS — N202 Calculus of kidney with calculus of ureter: Secondary | ICD-10-CM | POA: Diagnosis not present

## 2019-05-21 DIAGNOSIS — I10 Essential (primary) hypertension: Secondary | ICD-10-CM | POA: Diagnosis not present

## 2019-05-21 DIAGNOSIS — E890 Postprocedural hypothyroidism: Secondary | ICD-10-CM | POA: Diagnosis not present

## 2019-05-21 DIAGNOSIS — R251 Tremor, unspecified: Secondary | ICD-10-CM | POA: Diagnosis not present

## 2019-05-22 ENCOUNTER — Other Ambulatory Visit (HOSPITAL_COMMUNITY): Payer: Self-pay | Admitting: Urology

## 2019-05-22 ENCOUNTER — Other Ambulatory Visit: Payer: Self-pay | Admitting: Urology

## 2019-05-22 DIAGNOSIS — N200 Calculus of kidney: Secondary | ICD-10-CM

## 2019-05-27 DIAGNOSIS — N200 Calculus of kidney: Secondary | ICD-10-CM | POA: Diagnosis not present

## 2019-06-07 ENCOUNTER — Other Ambulatory Visit: Payer: Self-pay

## 2019-06-07 ENCOUNTER — Ambulatory Visit (HOSPITAL_COMMUNITY)
Admission: RE | Admit: 2019-06-07 | Discharge: 2019-06-07 | Disposition: A | Discharge status: Home / Self Care | Payer: PPO | Source: Ambulatory Visit | Attending: Urology | Admitting: Urology

## 2019-06-07 DIAGNOSIS — N200 Calculus of kidney: Secondary | ICD-10-CM

## 2019-07-02 ENCOUNTER — Ambulatory Visit: Payer: PPO | Admitting: Urology

## 2019-07-04 DIAGNOSIS — E890 Postprocedural hypothyroidism: Secondary | ICD-10-CM | POA: Diagnosis not present

## 2019-08-06 ENCOUNTER — Other Ambulatory Visit: Payer: Self-pay

## 2019-08-06 ENCOUNTER — Ambulatory Visit (INDEPENDENT_AMBULATORY_CARE_PROVIDER_SITE_OTHER): Payer: PPO | Admitting: Urology

## 2019-08-06 ENCOUNTER — Encounter: Payer: Self-pay | Admitting: Urology

## 2019-08-06 VITALS — BP 116/81 | HR 111 | Temp 97.2°F | Ht 65.0 in | Wt 182.0 lb

## 2019-08-06 DIAGNOSIS — N200 Calculus of kidney: Secondary | ICD-10-CM

## 2019-08-06 LAB — POCT URINALYSIS DIPSTICK
Bilirubin, UA: NEGATIVE
Glucose, UA: NEGATIVE
Ketones, UA: NEGATIVE
Nitrite, UA: NEGATIVE
Protein, UA: NEGATIVE
Spec Grav, UA: >=1.03 — AB (ref 1.010–1.025)
Urobilinogen, UA: 0.2 E.U./dL
pH, UA: 5 (ref 5.0–8.0)

## 2019-08-06 NOTE — Progress Notes (Signed)
H&P  Chief Complaint: Kidney Stone Surgery  History of Present Illness: Patricia [name] is a 69 y.o. year old female  2.16.2021: Here today for follow-up now 6 wks post stent removal. She reports that in the interval she has been doing much better with no voiding sx's. Aside from 24 hrs post stent removal, she denies having had any pain. This first day of pain, however, kept her up all night and nearly required a visit to the ER but was eventually manageable with advil. She has completed 24 hr urine and I have already advised her on results -- low urine volume (1.47 L) and slight increase in SS uric acid.  (below copied from [facility] records):  Kidney Stones (surgery):   Patricia [name] is a 69 year-old female established patient who is here for renal calculi after a surgical intervention.  9.22.2020: She presents today having had sx's of a kidney stone for the last couple months -- she went to the ER after a mo of side/flank pain and was told she had a concurrent bladder infection. She states unlike her previous infections, she did not have sx's of this infection. She denies having had any fever and has been on an antibiotic since this ER visit. Her pain has been intermittent and she reports that she has not had any since 9.18.2020. Most recent CT found two stones present on the right side -- one in ureter and another remaining in her right renal pelvis that will eventually need treatment. She denies a family history of gout or KS's.   11.12.2020: PC/tubeless mgmt of 21 mm rt renal pelvis stone, 9 mm rt distal ureteral stone. Stent placed.   12.1.2020: Here today for follow-up and stent removal. She has been having some pain around her stent but has otherwise had very little pain. She also notes some increased frequency post-op. Overall, however, she tolerated PCNL very well.  Past Medical History:  Diagnosis Date  . Arthritis   . Diabetes mellitus without complication (HCC)   . Hypertension    . Hypothyroidism   . PONV (postoperative nausea and vomiting)     Past Surgical History:  Procedure Laterality Date  . ABDOMINAL HYSTERECTOMY    . CHOLECYSTECTOMY N/A 03/02/2015   Procedure: LAPAROSCOPIC CHOLECYSTECTOMY;  Surgeon: Franky Macho, MD;  Location: [facility];  Service: General;  Laterality: N/A;  . HOLMIUM LASER APPLICATION Right 05/02/2019   Procedure: HOLMIUM LASER APPLICATION;  Surgeon: Marcine Matar, MD;  Location: [facility];  Service: Urology;  Laterality: Right;  . IR URETERAL STENT RIGHT NEW ACCESS W/O SEP NEPHROSTOMY CATH  05/02/2019  . KNEE ARTHROSCOPY Right   . LIVER BIOPSY  03/02/2015   Procedure: LAPAROSCOPIC LIVER BIOPSY;  Surgeon: Franky Macho, MD;  Location: [facility];  Service: General;;  . NEPHROLITHOTOMY Right 05/02/2019   Procedure: NEPHROLITHOTOMY PERCUTANEOUS;  Surgeon: Marcine Matar, MD;  Location: [facility];  Service: Urology;  Laterality: Right;  3 HRS  . THYROIDECTOMY    . TONSILLECTOMY  1970  . TUBAL LIGATION      Home Medications:  (Not in a hospital admission)   Allergies:  Allergies  Allergen Reactions  . Amlodipine     Swelling  . Keflex [Cephalexin] Rash    No family history on file.  Social History:  reports that she has never smoked. She has never used smokeless tobacco. She reports that she does not drink alcohol or use drugs.  ROS: A complete review of systems was performed.  All systems are  negative except for pertinent findings as noted.  Physical Exam:  Vital signs in last 24 hours: BP: ()/()  Arterial Line BP: ()/()  General:  Alert and oriented, No acute distress HEENT: Normocephalic, atraumatic Neck: No JVD or lymphadenopathy Cardiovascular: Regular rate and rhythm Lungs: Clear bilaterally Abdomen: Soft, nontender, nondistended, no abdominal masses Back: No CVA tenderness Extremities: No edema Neurologic: Grossly intact  Laboratory Data:  No results found for this or any previous visit (from the past 24  hour(s)). No results found for this or any previous visit (from the past 240 hour(s)). Creatinine: No results for input(s): CREATININE in the last 168 hours.  Radiologic Imaging: Follow-up CT scan from mid December 2020 revealed no evident urolithiasis.  I have reviewed prior pt notes  I have reviewed urinalysis results  I have independently reviewed prior imaging   Impression/Assessment:  She has had no pain, urinary sx's, or other complaints after 24 hrs post-op.  Based on results of her recent 24 hr urine study, it would benefit her to increase her fluid intake. No metabolic abnormalities were detected that would need management.   Plan:  1. Return in 6 mo's for OV w/ KUB  2. Advised to increase her fluid intake and to continue with general stone prevention strategies (low sodium/animal protein and adding citrus to water if desired).    Dena Billet 08/06/2019, 8:33 AM  Lillette Boxer. Isaura Schiller MD

## 2019-08-06 NOTE — Progress Notes (Signed)
Urological Symptom Review ° °Patient is experiencing the following symptoms: °none ° ° °Review of Systems ° °Gastrointestinal (upper)  : °Negative for upper GI symptoms ° °Gastrointestinal (lower) : °Negative for lower GI symptoms ° °Constitutional : °Negative for symptoms ° °Skin: °Negative for skin symptoms ° °Eyes: °Negative for eye symptoms ° °Ear/Nose/Throat : °Negative for Ear/Nose/Throat symptoms ° °Hematologic/Lymphatic: °Negative for Hematologic/Lymphatic symptoms ° °Cardiovascular : °Negative for cardiovascular symptoms ° °Respiratory : °Negative for respiratory symptoms ° °Endocrine: °Negative for endocrine symptoms ° °Musculoskeletal: °Back pain °Joint pain ° °Neurological: °Negative for neurological symptoms ° °Psychologic: °Negative for psychiatric symptoms °

## 2019-08-18 DIAGNOSIS — E039 Hypothyroidism, unspecified: Secondary | ICD-10-CM | POA: Diagnosis not present

## 2019-08-18 DIAGNOSIS — I10 Essential (primary) hypertension: Secondary | ICD-10-CM | POA: Diagnosis not present

## 2019-08-18 DIAGNOSIS — E7849 Other hyperlipidemia: Secondary | ICD-10-CM | POA: Diagnosis not present

## 2019-08-18 DIAGNOSIS — E1165 Type 2 diabetes mellitus with hyperglycemia: Secondary | ICD-10-CM | POA: Diagnosis not present

## 2019-08-22 DIAGNOSIS — E890 Postprocedural hypothyroidism: Secondary | ICD-10-CM | POA: Diagnosis not present

## 2019-09-05 DIAGNOSIS — Z6830 Body mass index (BMI) 30.0-30.9, adult: Secondary | ICD-10-CM | POA: Diagnosis not present

## 2019-09-05 DIAGNOSIS — E6609 Other obesity due to excess calories: Secondary | ICD-10-CM | POA: Diagnosis not present

## 2019-09-05 DIAGNOSIS — M5431 Sciatica, right side: Secondary | ICD-10-CM | POA: Diagnosis not present

## 2019-09-05 DIAGNOSIS — Z1389 Encounter for screening for other disorder: Secondary | ICD-10-CM | POA: Diagnosis not present

## 2019-09-18 DIAGNOSIS — E1165 Type 2 diabetes mellitus with hyperglycemia: Secondary | ICD-10-CM | POA: Diagnosis not present

## 2019-09-18 DIAGNOSIS — E039 Hypothyroidism, unspecified: Secondary | ICD-10-CM | POA: Diagnosis not present

## 2019-09-18 DIAGNOSIS — I10 Essential (primary) hypertension: Secondary | ICD-10-CM | POA: Diagnosis not present

## 2019-09-18 DIAGNOSIS — N402 Nodular prostate without lower urinary tract symptoms: Secondary | ICD-10-CM | POA: Diagnosis not present

## 2019-11-04 DIAGNOSIS — M1991 Primary osteoarthritis, unspecified site: Secondary | ICD-10-CM | POA: Diagnosis not present

## 2019-11-04 DIAGNOSIS — Z1389 Encounter for screening for other disorder: Secondary | ICD-10-CM | POA: Diagnosis not present

## 2019-11-04 DIAGNOSIS — E6609 Other obesity due to excess calories: Secondary | ICD-10-CM | POA: Diagnosis not present

## 2019-11-04 DIAGNOSIS — M5431 Sciatica, right side: Secondary | ICD-10-CM | POA: Diagnosis not present

## 2019-11-04 DIAGNOSIS — Z6830 Body mass index (BMI) 30.0-30.9, adult: Secondary | ICD-10-CM | POA: Diagnosis not present

## 2019-11-18 DIAGNOSIS — E7849 Other hyperlipidemia: Secondary | ICD-10-CM | POA: Diagnosis not present

## 2019-11-18 DIAGNOSIS — E1165 Type 2 diabetes mellitus with hyperglycemia: Secondary | ICD-10-CM | POA: Diagnosis not present

## 2019-11-18 DIAGNOSIS — I10 Essential (primary) hypertension: Secondary | ICD-10-CM | POA: Diagnosis not present

## 2019-11-18 DIAGNOSIS — E039 Hypothyroidism, unspecified: Secondary | ICD-10-CM | POA: Diagnosis not present

## 2019-11-25 DIAGNOSIS — M1991 Primary osteoarthritis, unspecified site: Secondary | ICD-10-CM | POA: Diagnosis not present

## 2019-11-25 DIAGNOSIS — M48061 Spinal stenosis, lumbar region without neurogenic claudication: Secondary | ICD-10-CM | POA: Diagnosis not present

## 2019-11-25 DIAGNOSIS — Z0000 Encounter for general adult medical examination without abnormal findings: Secondary | ICD-10-CM | POA: Diagnosis not present

## 2019-11-25 DIAGNOSIS — E890 Postprocedural hypothyroidism: Secondary | ICD-10-CM | POA: Diagnosis not present

## 2019-11-25 DIAGNOSIS — E7849 Other hyperlipidemia: Secondary | ICD-10-CM | POA: Diagnosis not present

## 2019-11-25 DIAGNOSIS — E1165 Type 2 diabetes mellitus with hyperglycemia: Secondary | ICD-10-CM | POA: Diagnosis not present

## 2019-11-25 DIAGNOSIS — Z681 Body mass index (BMI) 19 or less, adult: Secondary | ICD-10-CM | POA: Diagnosis not present

## 2019-11-25 DIAGNOSIS — I10 Essential (primary) hypertension: Secondary | ICD-10-CM | POA: Diagnosis not present

## 2019-11-25 DIAGNOSIS — Z1389 Encounter for screening for other disorder: Secondary | ICD-10-CM | POA: Diagnosis not present

## 2019-11-29 DIAGNOSIS — Z1211 Encounter for screening for malignant neoplasm of colon: Secondary | ICD-10-CM | POA: Diagnosis not present

## 2019-12-18 DIAGNOSIS — I10 Essential (primary) hypertension: Secondary | ICD-10-CM | POA: Diagnosis not present

## 2019-12-18 DIAGNOSIS — E039 Hypothyroidism, unspecified: Secondary | ICD-10-CM | POA: Diagnosis not present

## 2019-12-18 DIAGNOSIS — E1165 Type 2 diabetes mellitus with hyperglycemia: Secondary | ICD-10-CM | POA: Diagnosis not present

## 2019-12-18 DIAGNOSIS — N402 Nodular prostate without lower urinary tract symptoms: Secondary | ICD-10-CM | POA: Diagnosis not present

## 2020-01-02 IMAGING — DX DG ABDOMEN 1V
2 series · 2 of 2 positions shown · non-contrast
Comparison: CT Abdomen and Pelvis 03/01/2019.

CLINICAL DATA: 68-year-old female with right renal calculus.

EXAM:
ABDOMEN - 1 VIEW

[abdomen kub (1 of 2)]
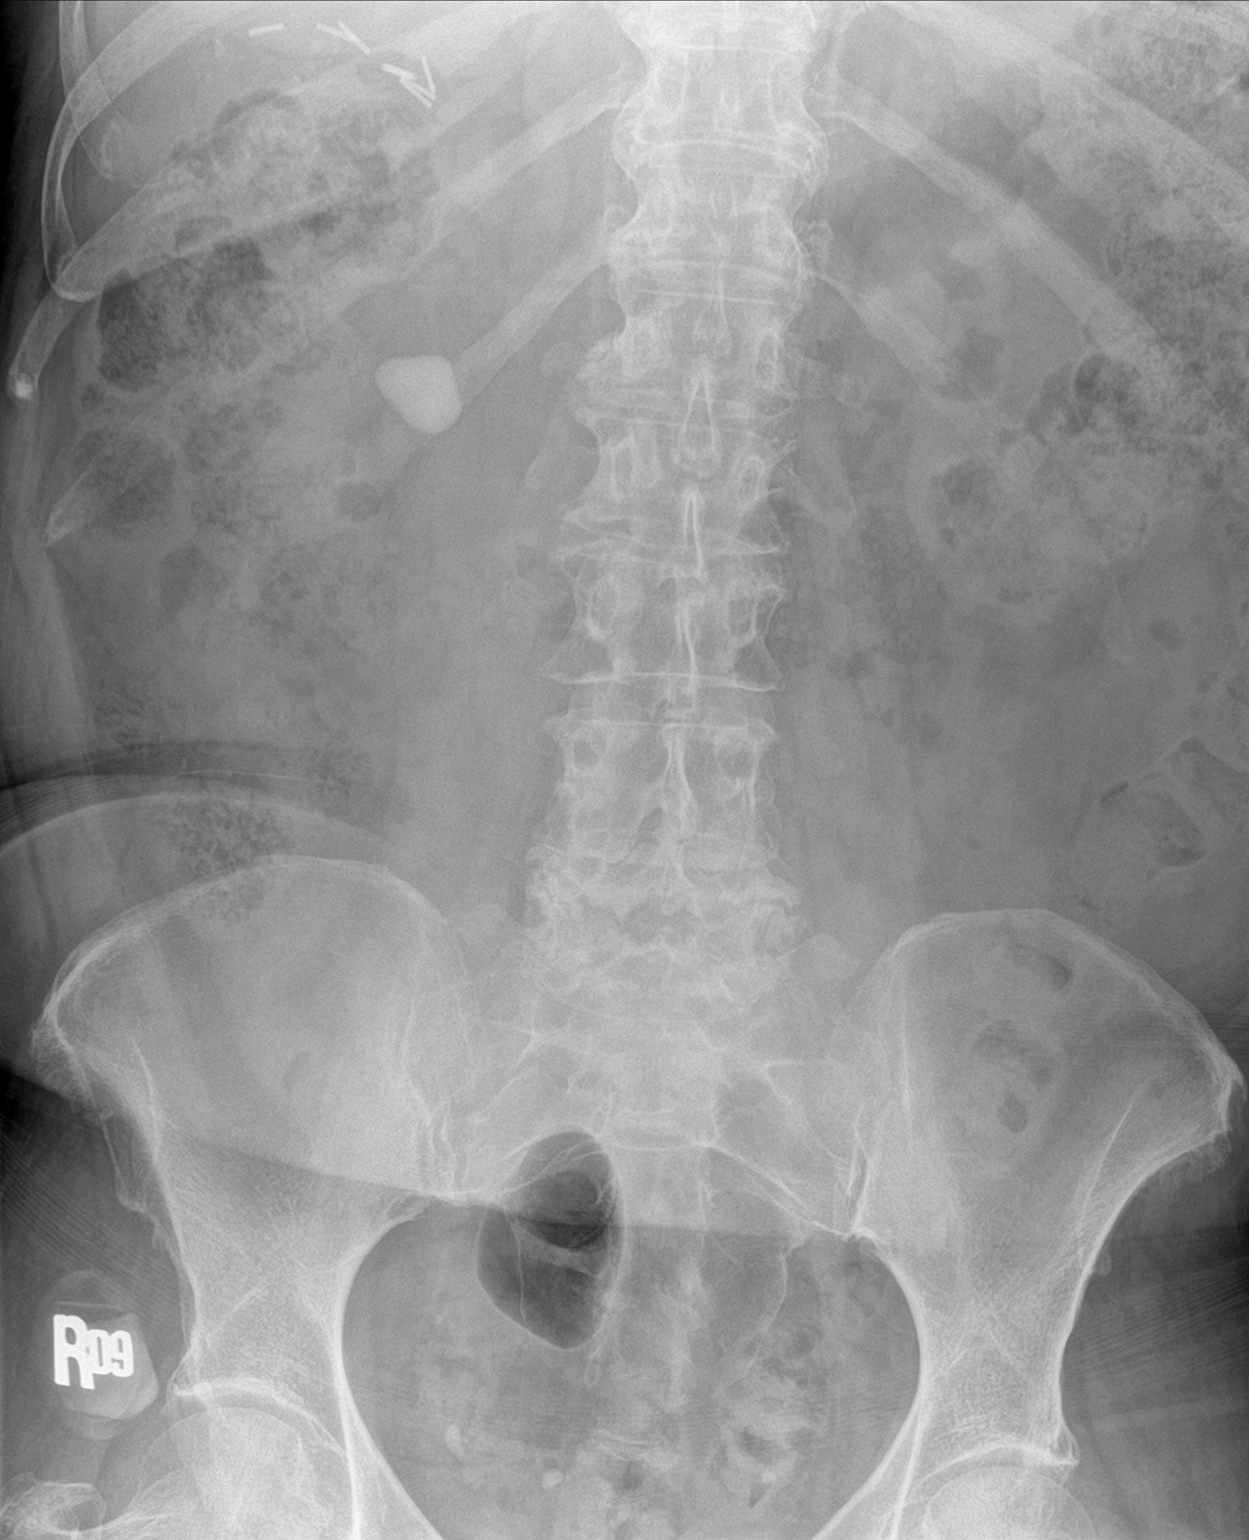

[abdomen kub (2 of 2)]
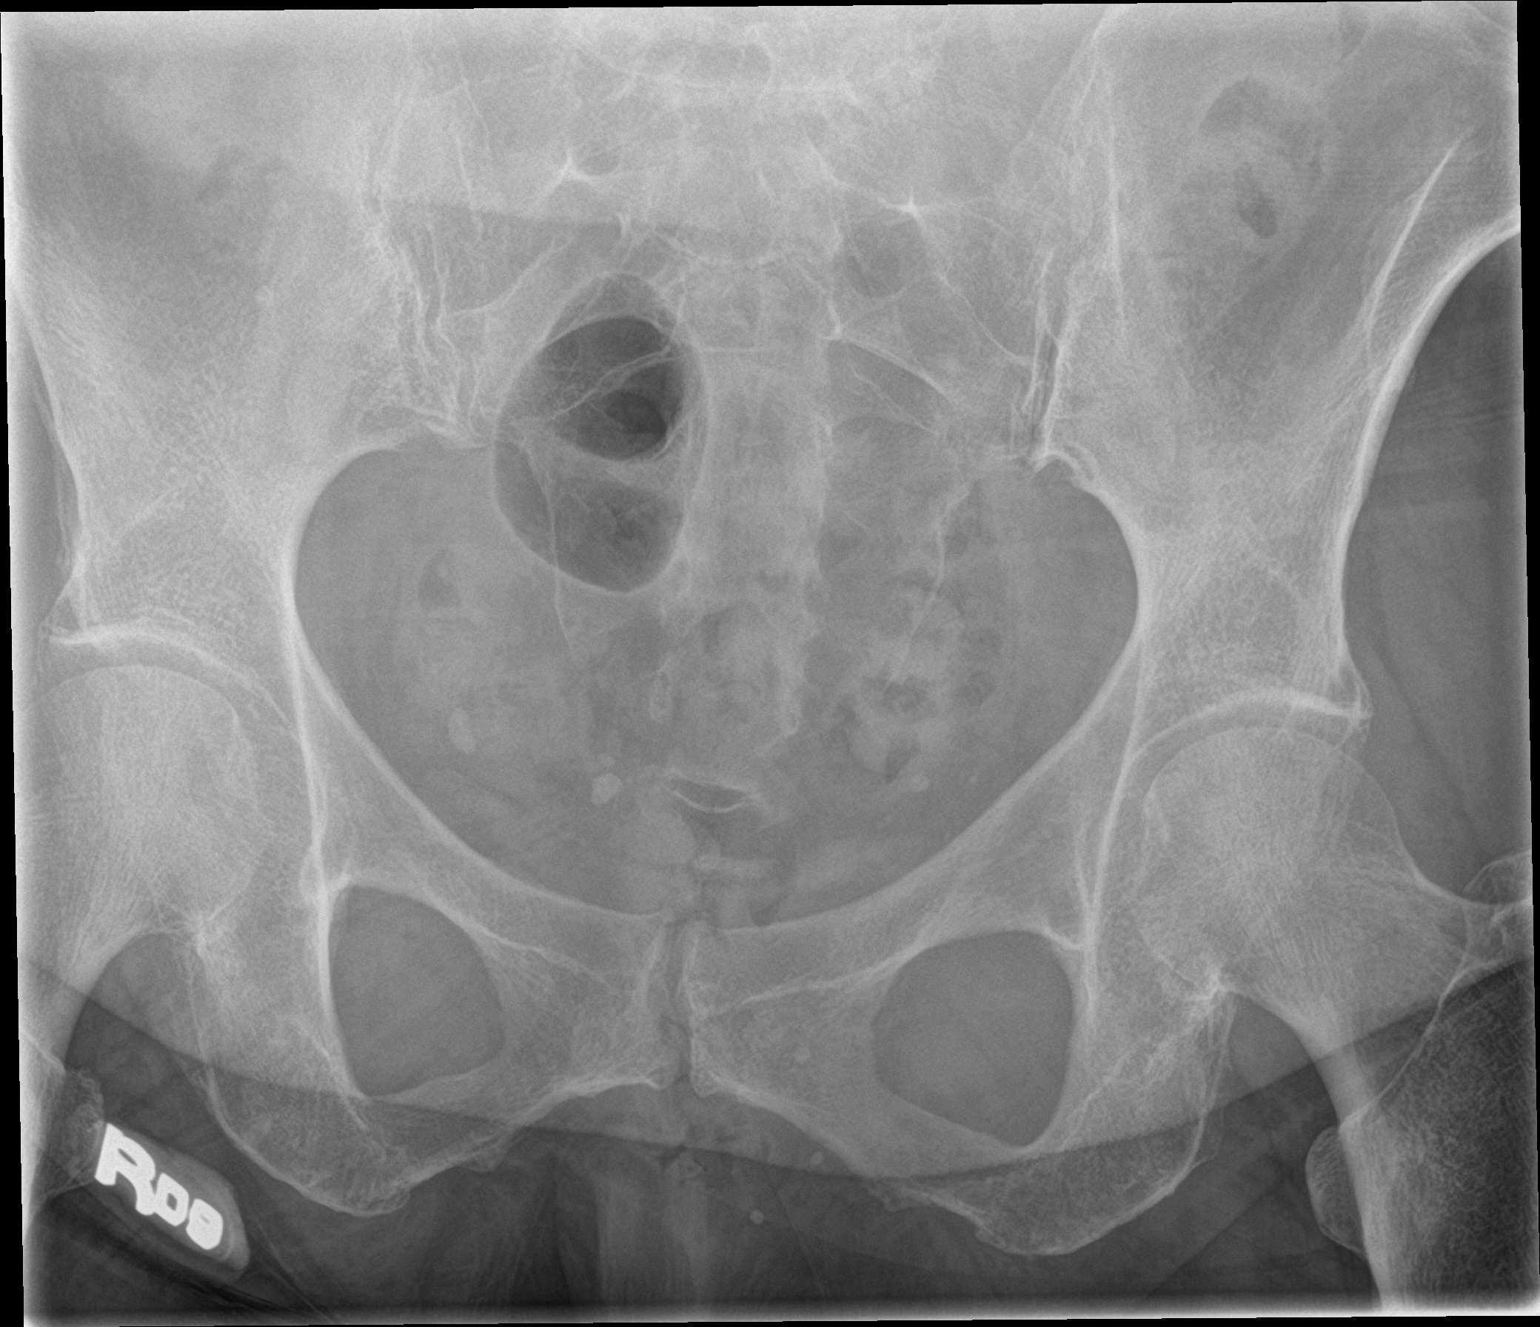

[2 of 2 positions shown; findings below may reference images not displayed]

FINDINGS: Persistent 9 millimeter distal ureteral calculus projecting in the
right hemipelvis, with several superimposed phleboliths. Position
appears unchanged from the scalp view 03/01/2019. Large superimposed
26 millimeter calculus of the right renal pelvis also appears
stable. No new urinary calculus.

Stable cholecystectomy clips. Non obstructed bowel gas pattern. No
acute osseous abnormality identified.
IMPRESSION: 1. Unchanged 9 mm distal right ureteral calculus from the CT
03/01/2019.
[DATE]. Stable large 26 mm right renal pelvis calculus.
3. Superimposed pelvic phleboliths. No new urinary calculus
identified.

## 2020-01-25 IMAGING — XA IR URETURAL STENT RIGHT NEW ACCESS W/O SEP NEPHROSTOMY CATH
6 of 7 series · 12 of 17 positions shown · non-contrast
Comparison: CT of the abdomen and pelvis - 03/01/2019

INDICATION: Renal stones, access for right-sided percutaneous nephrolithotomy.

EXAM:
1. ULTRASOUND AND FLUOROSCOPIC GUIDED PUNCTURE OF THE RIGHT SIDED
RENAL COLLECTING SYSTEM.
2. FLUOROSCOPIC GUIDED PLACEMENT OF A RIGHT SIDED NEPHROURETERAL
CATHETER.

[Series 1: care single · 1 of 1 slices shown (1 of 2)]
[im 1/1]
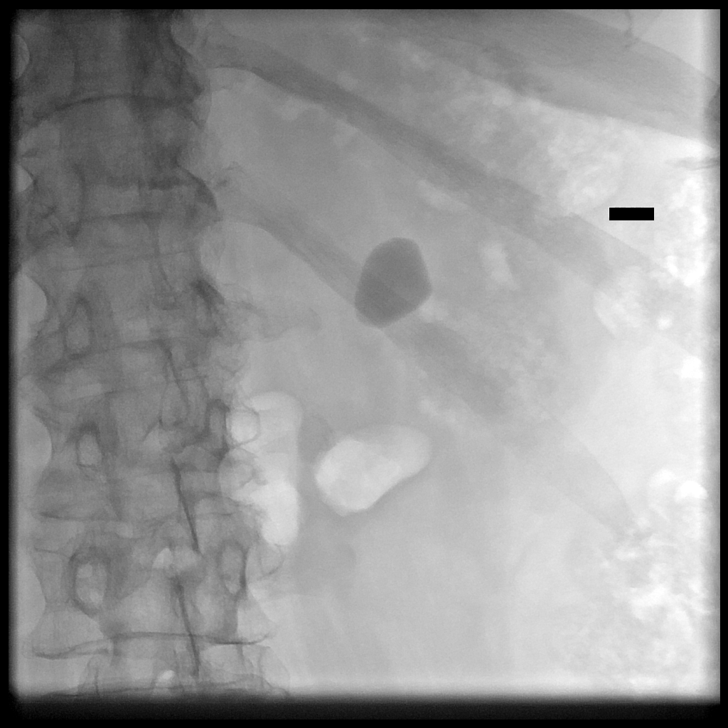

[Series 1: run · 0.16mm/px · 1 of 2 slices shown]
[im 2/2]
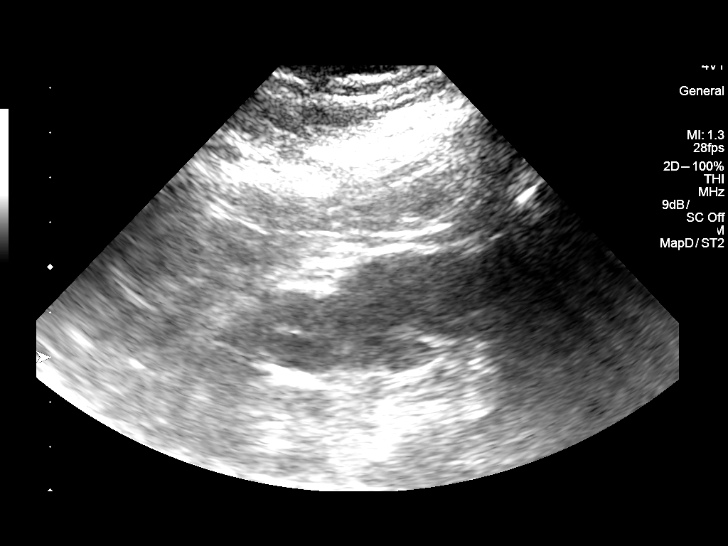

[Series 2: fl - angio · 3 of 100 frames shown (1 of 2)]
[frame 14/100]
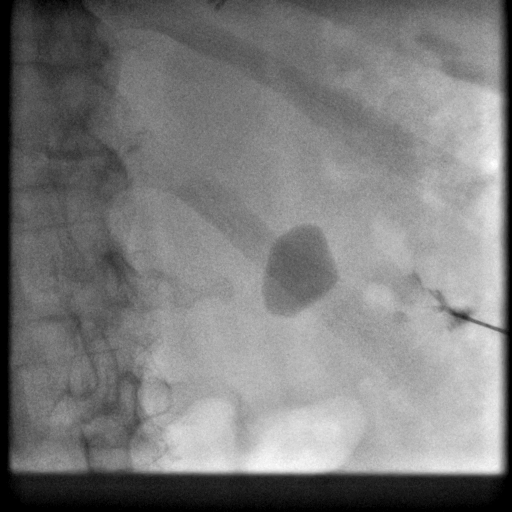
[frame 16/100]
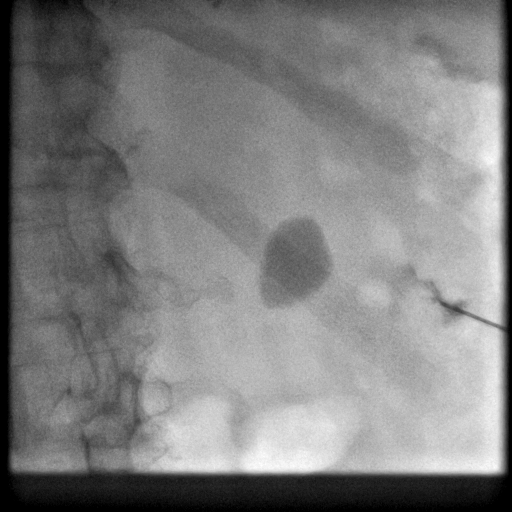
[frame 86/100]
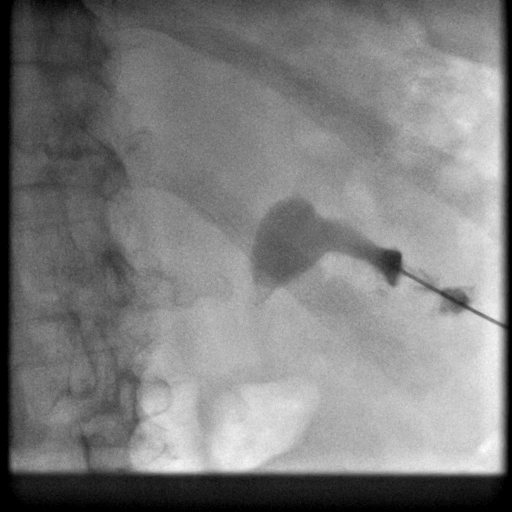

[Series 5: fl - angio · 2 of 64 frames shown (2 of 2)]
[frame 10/64]
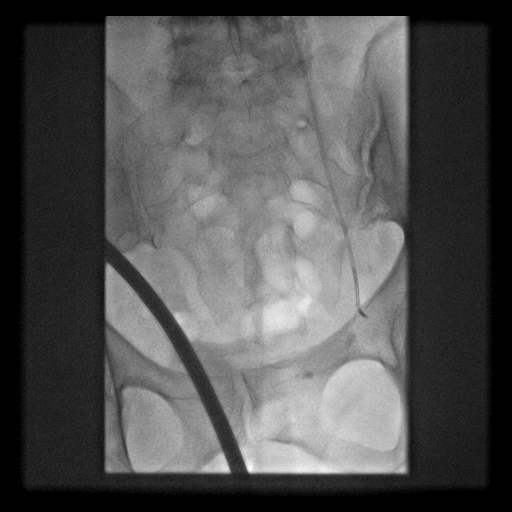
[frame 55/64]
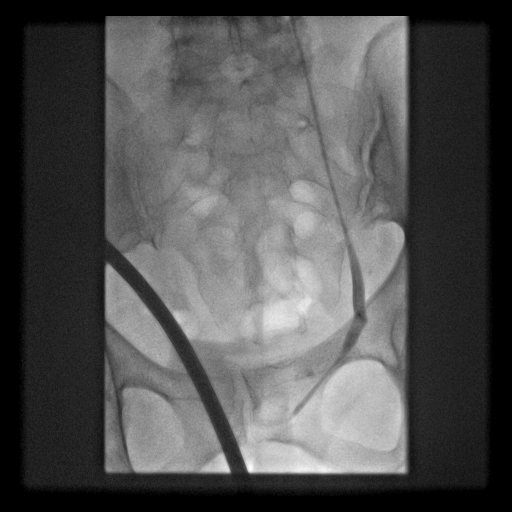

[Series 7: care single · 1 of 1 slices shown (2 of 2)]
[im 1/1]
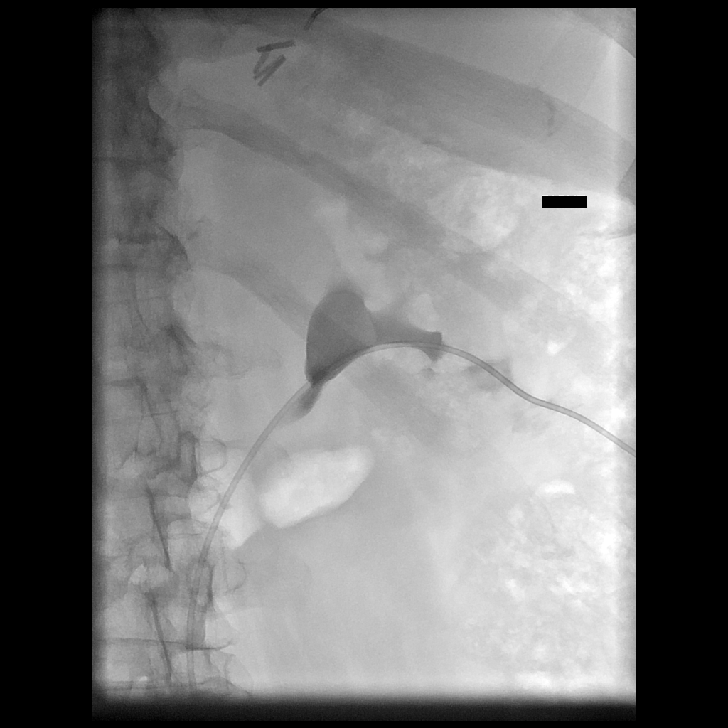

[Series 300: ir ureteral stent right new access w/o s · non-contrast · 4 of 5 slices shown]
[im 1/5]
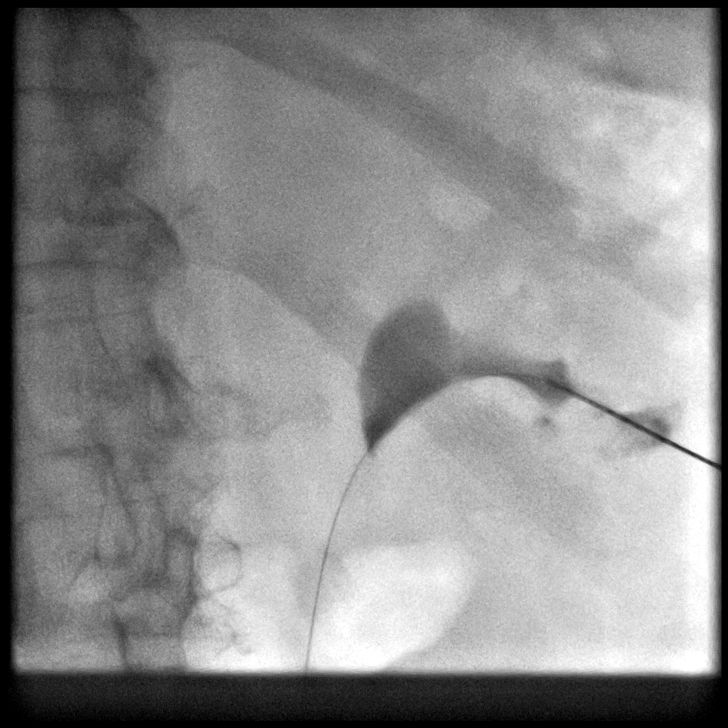
[im 2/5]
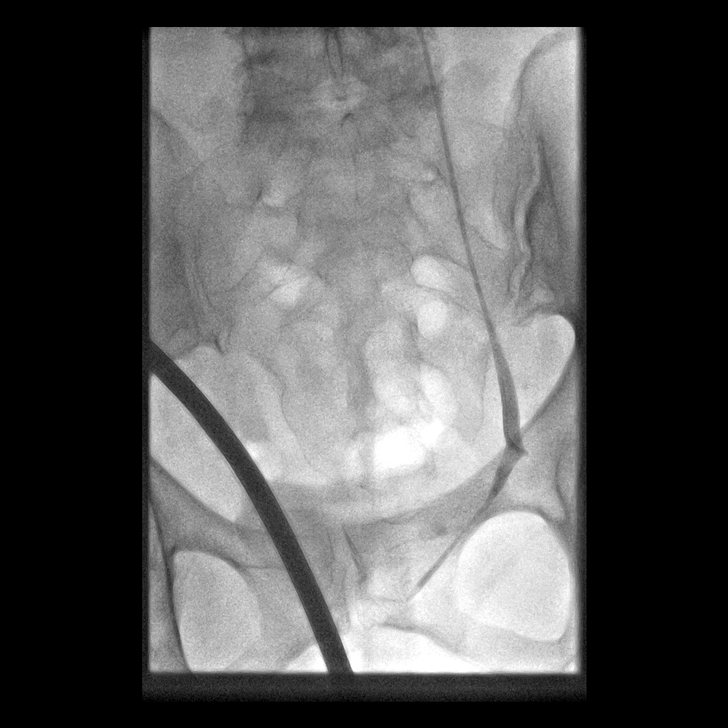
[im 3/5]
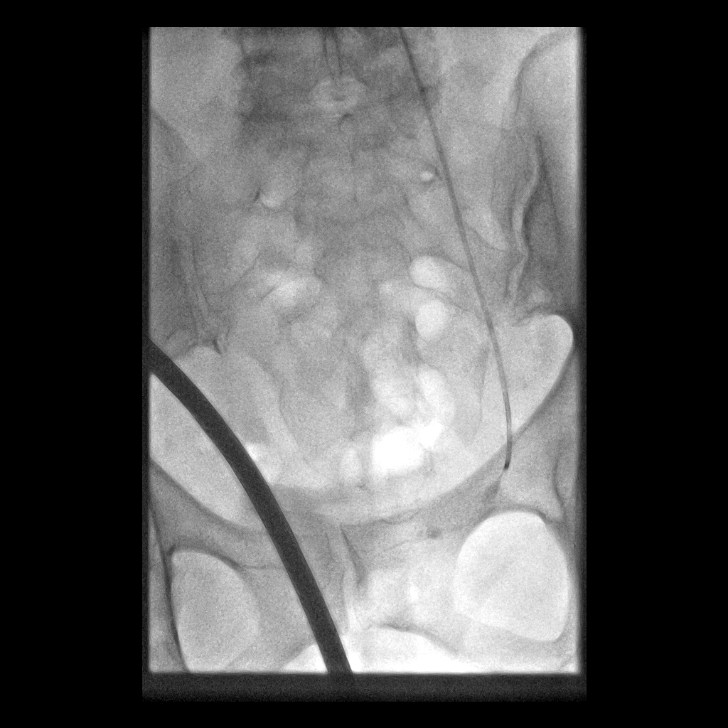
[im 5/5]
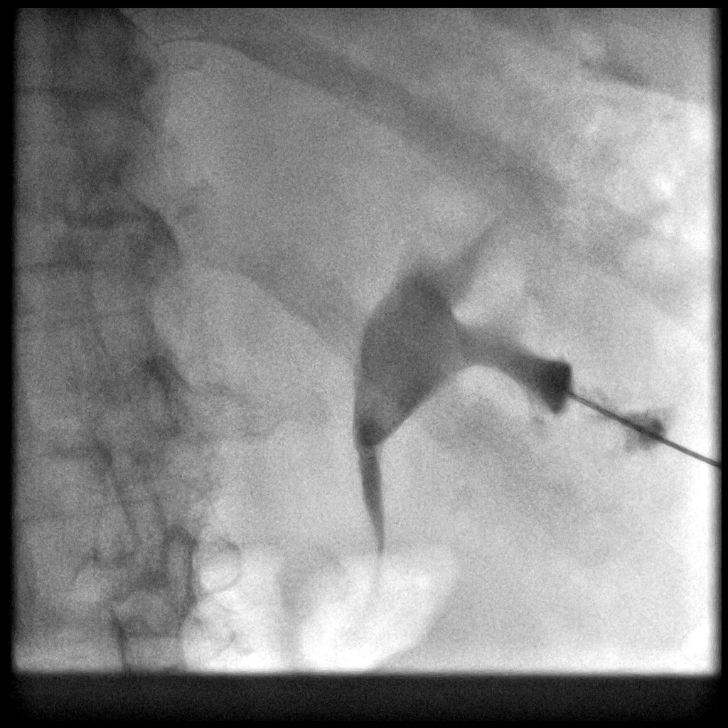

[12 of 17 positions shown; findings below may reference images not displayed]

MEDICATIONS:
Ciprofloxacin 400 mg IV; The antibiotic was administered in an
appropriate time frame prior to skin puncture.

ANESTHESIA/SEDATION:
Moderate (conscious) sedation was employed during this procedure. A
total of Versed 2 mg and Fentanyl 50 mcg was administered
intravenously.

Moderate Sedation Time: 14 minutes. The patient's level of
consciousness and vital signs were monitored continuously by
radiology nursing throughout the procedure under my direct
supervision.

CONTRAST:  15mL OMNIPAQUE IOHEXOL 300 MG/ML SOLN - Administered into
the renal collecting system.

FLUOROSCOPY TIME:  1 minute, 54 seconds (65 mGy)

COMPLICATIONS:
None immediate.

PROCEDURE:
Informed written consent was obtained from the patient after a
discussion of the risks, benefits, and alternatives to treatment.
The flank flank region was prepped with Betadine in a sterile
fashion, and a sterile drape was applied covering the operative
field. A sterile gown and sterile gloves were used for the
procedure. A timeout was performed prior to the initiation of the
procedure.

A pre procedural spot fluoroscopic image was obtained of the upper
abdomen. Ultrasound scanning performed of the kidney was negative
for significant hydronephrosis.

Regardless, a nondilated posteroinferior calyx was targeted with a
22 gauge Chiba needle under direct ultrasound guidance. Contrast
injection confirmed appropriate access to the right renal collecting
system. Next, a Nitrex wire was advanced through the calyx and
infundibulum past the nonobstructing pelvic stone to the level the
superior aspect of the right ureter.

An Accustick set was utilized to dilate the tract and was
subsequently exchanged for a Kumpe catheter over a Bentson wire. The
Kumpe catheter was advanced down the ureter and past the
nonobstructing distal right ureteral stone and into the urinary
bladder.

Postprocedural spot radiographs were obtained in various obliquities
and the catheter was sutured to the skin. The catheter was capped
and a dressing was placed.

The patient tolerated the procedure well without immediate post
procedural complication.
FINDINGS: Pre procedural spot radiographic images demonstrates unchanged size
and appearance of the approximately 2.1 x 1.6 cm known right pelvic
renal stone.

Ultrasound scanning was negative for significant hydronephrosis
however a non dilated posteroinferior calyx was successfully
accessed under direct ultrasound guidance allowing placement of a 5
French Kumpe catheter through the accessed posteroinferior calyx,
past right pelvic stone as well as the approximately 0.8 x 0.4 cm
nonobstructing stone within distal aspect the right ureter with end
ultimately position within the urinary bladder.

Note is also made of a prominent phlebolith within the caudal aspect
the left hemipelvis as demonstrated on preceding abdominal and
pelvic CT.
IMPRESSION: Successful ultrasound and fluoroscopic guided placement of a right
sided 5 French Kumpe catheter to the level of the urinary bladder
past both the known pelvic and distal right ureteral stones to be
utilized during impending nephrolithotomy procedure.

## 2020-02-04 ENCOUNTER — Ambulatory Visit (HOSPITAL_COMMUNITY)
Admission: RE | Admit: 2020-02-04 | Discharge: 2020-02-04 | Disposition: A | Discharge status: Home / Self Care | Payer: PPO | Source: Ambulatory Visit | Attending: Urology | Admitting: Urology

## 2020-02-04 ENCOUNTER — Other Ambulatory Visit: Payer: Self-pay

## 2020-02-04 ENCOUNTER — Ambulatory Visit (INDEPENDENT_AMBULATORY_CARE_PROVIDER_SITE_OTHER): Payer: PPO | Admitting: Urology

## 2020-02-04 ENCOUNTER — Ambulatory Visit: Payer: PPO | Admitting: Urology

## 2020-02-04 VITALS — BP 150/81 | HR 109 | Temp 97.9°F | Ht 65.0 in | Wt 182.0 lb

## 2020-02-04 DIAGNOSIS — N200 Calculus of kidney: Secondary | ICD-10-CM

## 2020-02-04 DIAGNOSIS — M47816 Spondylosis without myelopathy or radiculopathy, lumbar region: Secondary | ICD-10-CM | POA: Diagnosis not present

## 2020-02-04 LAB — URINALYSIS, ROUTINE W REFLEX MICROSCOPIC
Bilirubin, UA: NEGATIVE
Glucose, UA: NEGATIVE
Ketones, UA: NEGATIVE
Nitrite, UA: NEGATIVE
Protein,UA: NEGATIVE
Specific Gravity, UA: 1.015 (ref 1.005–1.030)
Urobilinogen, Ur: 1 mg/dL (ref 0.2–1.0)
pH, UA: 5 (ref 5.0–7.5)

## 2020-02-04 LAB — MICROSCOPIC EXAMINATION: Renal Epithel, UA: NONE SEEN /hpf

## 2020-02-04 NOTE — Progress Notes (Signed)
Results mailed. Unable to reach by phone 

## 2020-02-04 NOTE — Progress Notes (Signed)
Urological Symptom Review  Patient is experiencing the following symptoms: Get up at night to urinate Leakage of urine   Review of Systems  Gastrointestinal (upper)  : Negative for upper GI symptoms  Gastrointestinal (lower) : Negative for lower GI symptoms  Constitutional : Negative for symptoms  Skin: Negative for skin symptoms  Eyes: Negative for eye symptoms  Ear/Nose/Throat : Negative for Ear/Nose/Throat symptoms  Hematologic/Lymphatic: Negative for Hematologic/Lymphatic symptoms  Cardiovascular : Negative for cardiovascular symptoms  Respiratory : Negative for respiratory symptoms  Endocrine: Negative for endocrine symptoms  Musculoskeletal: Back pain Joint pain  Neurological: Negative for neurological symptoms  Psychologic: Negative for psychiatric symptoms  

## 2020-02-04 NOTE — Progress Notes (Signed)
H&P  Chief Complaint: History of Renal Calculi  History of Present Illness: Patricia [name] is a 69 y.o. year old female established patient who is here for renal calculi following surgical intervention.  8.17.2021: Pt submits no urinary complaints or concerns at this time. Pt denies any gross hematuria, recent infections, or kidney stone related symptomatology. Pt notes that she has reduced her sodium intake and increased her water intake to limit stone formation.  (below copied from [facility] records):  Kidney Stones (surgery):   Patricia [name] is a 69 year-old female established patient who is here for renal calculi after a surgical intervention.  9.22.2020: She presents today having had sx's of a kidney stone for the last couple months -- she went to the ER after a mo of side/flank pain and was told she had a concurrent bladder infection. She states unlike her previous infections, she did not have sx's of this infection. She denies having had any fever and has been on an antibiotic since this ER visit. Her pain has been intermittent and she reports that she has not had any since 9.18.2020. Most recent CT found two stones present on the right side -- one in ureter and another remaining in her right renal pelvis that will eventually need treatment. She denies a family history of gout or KS's.   11.12.2020: PC/tubeless mgmt of 21 mm rt renal pelvis stone, 9 mm rt distal ureteral stone. Stent placed.   12.1.2020: Here today for follow-up and stent removal. She has been having some pain around her stent but has otherwise had very little pain. She also notes some increased frequency post-op. Overall, however, she tolerated PCNL very well.  2.16.2021: Here today for follow-up now 6 wks post stent removal. She reports that in the interval she has been doing much better with no voiding sx's. Aside from 24 hrs post stent removal, she denies having had any pain. This first day of pain, however, kept  her up all night and nearly required a visit to the ER but was eventually manageable with advil. She has completed 24 hr urine and I have already advised her on results -- low urine volume (1.47 L) and slight increase in SS uric acid.  Past Medical History:  Diagnosis Date  . Arthritis   . Diabetes mellitus without complication (HCC)   . Hypertension   . Hypothyroidism   . PONV (postoperative nausea and vomiting)     Past Surgical History:  Procedure Laterality Date  . ABDOMINAL HYSTERECTOMY    . CHOLECYSTECTOMY N/A 03/02/2015   Procedure: LAPAROSCOPIC CHOLECYSTECTOMY;  Surgeon: Franky Macho, MD;  Location: [facility];  Service: General;  Laterality: N/A;  . HOLMIUM LASER APPLICATION Right 05/02/2019   Procedure: HOLMIUM LASER APPLICATION;  Surgeon: Marcine Matar, MD;  Location: [facility];  Service: Urology;  Laterality: Right;  . IR URETERAL STENT RIGHT NEW ACCESS W/O SEP NEPHROSTOMY CATH  05/02/2019  . KNEE ARTHROSCOPY Right   . LIVER BIOPSY  03/02/2015   Procedure: LAPAROSCOPIC LIVER BIOPSY;  Surgeon: Franky Macho, MD;  Location: [facility];  Service: General;;  . NEPHROLITHOTOMY Right 05/02/2019   Procedure: NEPHROLITHOTOMY PERCUTANEOUS;  Surgeon: Marcine Matar, MD;  Location: [facility];  Service: Urology;  Laterality: Right;  3 HRS  . THYROIDECTOMY    . TONSILLECTOMY  1970  . TUBAL LIGATION      Home Medications:  (Not in a hospital admission)   Allergies:  Allergies  Allergen Reactions  . Amlodipine  Swelling  . Keflex [Cephalexin] Rash    No family history on file.  Social History:  reports that she has never smoked. She has never used smokeless tobacco. She reports that she does not drink alcohol and does not use drugs.  ROS: A complete review of systems was performed.  All systems are negative except for pertinent findings as noted.  Physical Exam:  Vital signs in last 24 hours: BP: ()/()  Arterial Line BP: ()/()  General:  Alert and oriented, No acute  distress HEENT: Normocephalic, atraumatic Neck: No JVD or lymphadenopathy Lungs: Normal inspiratory/expiratory excursion Abdomen: Soft, nontender, nondistended, no abdominal masses Back: No CVA tenderness Extremities: No edema Neurologic: Grossly intact  Laboratory Data:  No results found for this or any previous visit (from the past 24 hour(s)). No results found for this or any previous visit (from the past 240 hour(s)). Creatinine: No results for input(s): CREATININE in the last 168 hours.  Radiologic Imaging: No results found.  Impression/Assessment:  History of large right renal calculus, status post percutaneous nephrolithotomy.  Currently asymptomatic.  She does practice increase fluid intake and decrease sodium intake  Plan:  1. Pt to have KUB taken today.  2. F/U in one year for ov and KUB  3.  Patient strongly recommended to get vaccinated.  Kenney Houseman 02/04/2020, 9:12 AM  Bertram Millard. Jamorion Gomillion MD

## 2020-02-05 ENCOUNTER — Telehealth: Payer: Self-pay

## 2020-02-05 NOTE — Telephone Encounter (Signed)
Pt made aware

## 2020-02-05 NOTE — Telephone Encounter (Signed)
-----   Message from Marcine Matar, MD sent at 02/04/2020  4:41 PM EDT ----- Notify patient that the x-ray showed no further stones.  Good news. ----- Message ----- From: Ferdinand Lango, RN Sent: 02/04/2020   4:11 PM EDT To: Marcine Matar, MD  Please review

## 2020-02-14 ENCOUNTER — Other Ambulatory Visit (HOSPITAL_COMMUNITY): Payer: Self-pay | Admitting: Family Medicine

## 2020-02-14 DIAGNOSIS — Z1231 Encounter for screening mammogram for malignant neoplasm of breast: Secondary | ICD-10-CM

## 2020-02-20 ENCOUNTER — Other Ambulatory Visit: Payer: Self-pay

## 2020-02-20 ENCOUNTER — Ambulatory Visit (HOSPITAL_COMMUNITY)
Admission: RE | Admit: 2020-02-20 | Discharge: 2020-02-20 | Disposition: A | Discharge status: Home / Self Care | Payer: PPO | Source: Ambulatory Visit | Attending: Family Medicine | Admitting: Family Medicine

## 2020-02-20 DIAGNOSIS — Z1231 Encounter for screening mammogram for malignant neoplasm of breast: Secondary | ICD-10-CM | POA: Diagnosis not present

## 2020-03-05 DIAGNOSIS — Z23 Encounter for immunization: Secondary | ICD-10-CM | POA: Diagnosis not present

## 2020-06-01 DIAGNOSIS — M1991 Primary osteoarthritis, unspecified site: Secondary | ICD-10-CM | POA: Diagnosis not present

## 2020-06-01 DIAGNOSIS — Z6829 Body mass index (BMI) 29.0-29.9, adult: Secondary | ICD-10-CM | POA: Diagnosis not present

## 2020-06-01 DIAGNOSIS — E118 Type 2 diabetes mellitus with unspecified complications: Secondary | ICD-10-CM | POA: Diagnosis not present

## 2020-06-01 DIAGNOSIS — E7849 Other hyperlipidemia: Secondary | ICD-10-CM | POA: Diagnosis not present

## 2020-06-01 DIAGNOSIS — I10 Essential (primary) hypertension: Secondary | ICD-10-CM | POA: Diagnosis not present

## 2020-06-01 DIAGNOSIS — Z0000 Encounter for general adult medical examination without abnormal findings: Secondary | ICD-10-CM | POA: Diagnosis not present

## 2020-06-01 DIAGNOSIS — R251 Tremor, unspecified: Secondary | ICD-10-CM | POA: Diagnosis not present

## 2020-08-13 ENCOUNTER — Other Ambulatory Visit (HOSPITAL_COMMUNITY): Payer: Self-pay | Admitting: Family Medicine

## 2020-08-13 ENCOUNTER — Other Ambulatory Visit: Payer: Self-pay

## 2020-08-13 ENCOUNTER — Ambulatory Visit (HOSPITAL_COMMUNITY)
Admission: RE | Admit: 2020-08-13 | Discharge: 2020-08-13 | Disposition: A | Discharge status: Home / Self Care | Payer: PPO | Source: Ambulatory Visit | Attending: Family Medicine | Admitting: Family Medicine

## 2020-08-13 DIAGNOSIS — Z6829 Body mass index (BMI) 29.0-29.9, adult: Secondary | ICD-10-CM | POA: Diagnosis not present

## 2020-08-13 DIAGNOSIS — M19071 Primary osteoarthritis, right ankle and foot: Secondary | ICD-10-CM | POA: Diagnosis not present

## 2020-08-13 DIAGNOSIS — S99921D Unspecified injury of right foot, subsequent encounter: Secondary | ICD-10-CM | POA: Insufficient documentation

## 2020-08-13 DIAGNOSIS — Z1389 Encounter for screening for other disorder: Secondary | ICD-10-CM | POA: Diagnosis not present

## 2020-08-13 DIAGNOSIS — R202 Paresthesia of skin: Secondary | ICD-10-CM | POA: Diagnosis not present

## 2020-08-13 DIAGNOSIS — S99921A Unspecified injury of right foot, initial encounter: Secondary | ICD-10-CM | POA: Diagnosis not present

## 2020-08-13 DIAGNOSIS — E663 Overweight: Secondary | ICD-10-CM | POA: Diagnosis not present

## 2020-08-27 ENCOUNTER — Ambulatory Visit: Payer: PPO | Admitting: Orthopedic Surgery

## 2020-08-27 DIAGNOSIS — M19071 Primary osteoarthritis, right ankle and foot: Secondary | ICD-10-CM

## 2020-08-27 DIAGNOSIS — M6701 Short Achilles tendon (acquired), right ankle: Secondary | ICD-10-CM

## 2020-09-07 ENCOUNTER — Encounter: Payer: Self-pay | Admitting: Orthopedic Surgery

## 2020-09-07 NOTE — Progress Notes (Signed)
Office Visit Note   Patient: Patricia [name]           Date of Birth: 10/03/1950           MRN: [id number] Visit Date: 08/27/2020              Requested by: Assunta Found, MD [address] [place],  [place] [address] PCP: Assunta Found, MD  Chief Complaint  Patient presents with  . Right Foot - Pain      HPI: Patient is a 70 year old woman who is seen for initial evaluation for right midfoot pain.  Patient states she has had pain for a year and a half.  Patient has had radiographs.  Past medical history significant for type 2 diabetes.  Patient states she has a family history of osteoarthritis of multiple joints.  Assessment & Plan: Visit Diagnoses:  1. Primary osteoarthritis, right ankle and foot     Plan: Discussed the importance of Achilles stretching she was given instructions and demonstrated heel cord stretching she will do this 5 times a day a minute at a time recommended a stiff soled sneaker to unload the midfoot recommended Voltaren gel.  Discussed that if she fails conservative treatment with stretching and stiffer sneakers we could proceed with fusion across the base of the first and second metatarsals.  Follow-Up Instructions: Return if symptoms worsen or fail to improve.   Ortho Exam  Patient is alert, oriented, no adenopathy, well-dressed, normal affect, normal respiratory effort. Examination patient has a good dorsalis pedis and posterior tibial pulse she has a palpable bony spur at the base of the first and second metatarsals this is tender to palpation.  Distraction across the Lisfranc complex also reproduces pain.  With her knee extended she has significant Achilles contracture with dorsiflexion about 10 degrees short of neutral.  Imaging: No results found. No images are attached to the encounter.  Labs: Lab Results  Component Value Date   HGBA1C 6.9 (H) 04/26/2019   REPTSTATUS 08/14/2009 FINAL 08/12/2009   CULT  08/12/2009    Multiple bacterial  morphotypes present, none predominant. Suggest appropriate recollection if clinically indicated.     Lab Results  Component Value Date   ALBUMIN 4.2 02/26/2015   ALBUMIN 3.0 (L) 08/13/2009   ALBUMIN 4.2 08/12/2009    No results found for: MG No results found for: VD25OH  No results found for: PREALBUMIN CBC EXTENDED Latest Ref Rng & Units 05/03/2019 05/02/2019 04/26/2019  WBC 4.0 - 10.5 K/uL - 10.2 14.0(H)  RBC 3.87 - 5.11 MIL/uL - 4.92 5.05  HGB 12.0 - 15.0 g/dL 11.6(L) 13.7 14.1  HCT 36.0 - 46.0 % 37.5 43.4 45.6  PLT 150 - 400 K/uL - 283 321  NEUTROABS 1.7 - 7.7 K/uL - - -  LYMPHSABS 0.7 - 4.0 K/uL - - -     There is no height or weight on file to calculate BMI.  Orders:  No orders of the defined types were placed in this encounter.  No orders of the defined types were placed in this encounter.    Procedures: No procedures performed  Clinical Data: No additional findings.  ROS:  All other systems negative, except as noted in the HPI. Review of Systems  Objective: Vital Signs: There were no vitals taken for this visit.  Specialty Comments:  No specialty comments available.  PMFS History: Patient Active Problem List   Diagnosis Date Noted  . Calculus of kidney 05/02/2019  . Pain in thoracic spine 12/26/2013  .  Lumbago 12/26/2013  . Stiffness of joint, not elsewhere classified, pelvic region and thigh 12/26/2013  . Abnormality of gait 12/26/2013   Past Medical History:  Diagnosis Date  . Arthritis   . Diabetes mellitus without complication (HCC)   . Hypertension   . Hypothyroidism   . PONV (postoperative nausea and vomiting)     History reviewed. No pertinent family history.  Past Surgical History:  Procedure Laterality Date  . ABDOMINAL HYSTERECTOMY    . CHOLECYSTECTOMY N/A 03/02/2015   Procedure: LAPAROSCOPIC CHOLECYSTECTOMY;  Surgeon: Franky Macho, MD;  Location: [facility];  Service: General;  Laterality: N/A;  . HOLMIUM LASER APPLICATION Right  05/02/2019   Procedure: HOLMIUM LASER APPLICATION;  Surgeon: Marcine Matar, MD;  Location: [facility];  Service: Urology;  Laterality: Right;  . IR URETERAL STENT RIGHT NEW ACCESS W/O SEP NEPHROSTOMY CATH  05/02/2019  . KNEE ARTHROSCOPY Right   . LIVER BIOPSY  03/02/2015   Procedure: LAPAROSCOPIC LIVER BIOPSY;  Surgeon: Franky Macho, MD;  Location: [facility];  Service: General;;  . NEPHROLITHOTOMY Right 05/02/2019   Procedure: NEPHROLITHOTOMY PERCUTANEOUS;  Surgeon: Marcine Matar, MD;  Location: [facility];  Service: Urology;  Laterality: Right;  3 HRS  . THYROIDECTOMY    . TONSILLECTOMY  1970  . TUBAL LIGATION     Social History   Occupational History  . Not on file  Tobacco Use  . Smoking status: Never Smoker  . Smokeless tobacco: Never Used  Vaping Use  . Vaping Use: Never used  Substance and Sexual Activity  . Alcohol use: No  . Drug use: No  . Sexual activity: Not on file

## 2020-10-26 DIAGNOSIS — Z0001 Encounter for general adult medical examination with abnormal findings: Secondary | ICD-10-CM | POA: Diagnosis not present

## 2020-10-26 DIAGNOSIS — E7849 Other hyperlipidemia: Secondary | ICD-10-CM | POA: Diagnosis not present

## 2020-10-26 DIAGNOSIS — E1140 Type 2 diabetes mellitus with diabetic neuropathy, unspecified: Secondary | ICD-10-CM | POA: Diagnosis not present

## 2020-10-26 DIAGNOSIS — R251 Tremor, unspecified: Secondary | ICD-10-CM | POA: Diagnosis not present

## 2020-10-26 DIAGNOSIS — Z1389 Encounter for screening for other disorder: Secondary | ICD-10-CM | POA: Diagnosis not present

## 2020-10-26 DIAGNOSIS — Z1331 Encounter for screening for depression: Secondary | ICD-10-CM | POA: Diagnosis not present

## 2020-10-26 DIAGNOSIS — Z6829 Body mass index (BMI) 29.0-29.9, adult: Secondary | ICD-10-CM | POA: Diagnosis not present

## 2020-11-06 DIAGNOSIS — M5431 Sciatica, right side: Secondary | ICD-10-CM | POA: Diagnosis not present

## 2020-11-06 DIAGNOSIS — Z6829 Body mass index (BMI) 29.0-29.9, adult: Secondary | ICD-10-CM | POA: Diagnosis not present

## 2020-11-06 DIAGNOSIS — E663 Overweight: Secondary | ICD-10-CM | POA: Diagnosis not present

## 2020-11-06 DIAGNOSIS — R42 Dizziness and giddiness: Secondary | ICD-10-CM | POA: Diagnosis not present

## 2020-11-12 ENCOUNTER — Other Ambulatory Visit: Payer: Self-pay

## 2020-11-12 ENCOUNTER — Ambulatory Visit
Admission: EM | Admit: 2020-11-12 | Discharge: 2020-11-12 | Disposition: A | Discharge status: Home / Self Care | Payer: PPO | Attending: Emergency Medicine | Admitting: Emergency Medicine

## 2020-11-12 DIAGNOSIS — A084 Viral intestinal infection, unspecified: Secondary | ICD-10-CM | POA: Diagnosis not present

## 2020-11-12 DIAGNOSIS — R110 Nausea: Secondary | ICD-10-CM

## 2020-11-12 DIAGNOSIS — R197 Diarrhea, unspecified: Secondary | ICD-10-CM

## 2020-11-12 DIAGNOSIS — Z7689 Persons encountering health services in other specified circumstances: Secondary | ICD-10-CM | POA: Diagnosis not present

## 2020-11-12 LAB — POCT FASTING CBG KUC MANUAL ENTRY: POCT Glucose (KUC): 188 mg/dL — AB (ref 70–99)

## 2020-11-12 MED ORDER — ONDANSETRON HCL 4 MG PO TABS: 4.0000 mg | ORAL_TABLET | Freq: Four times a day (QID) | ORAL | 0 refills | Status: DC

## 2020-11-12 MED ORDER — ONDANSETRON 4 MG PO TBDP
4.0000 mg | ORAL_TABLET | Freq: Once | ORAL | Status: AC
  Administered 2020-11-12: 4 mg via ORAL

## 2020-11-12 NOTE — ED Provider Notes (Signed)
[facility]   [id number] 11/12/20 Arrival Time: 1154   CC: COVID symptoms  SUBJECTIVE: History from: patient.  Patricia [name] is a 70 y.o. female who presents with nausea, diarrhea x 1 day.  Denies sick exposure to COVID, flu or strep.   Has tried OTC medications without relief.  Symptoms are made worse eating.  Reports/ denies previous symptoms in the past.   Denies fever, chills, fatigue, sinus pain, rhinorrhea, sore throat, SOB, wheezing, chest pain, nausea, changes in bowel or bladder habits.     ROS: As per HPI.  All other pertinent ROS negative.     Past Medical History:  Diagnosis Date  . Arthritis   . Diabetes mellitus without complication (HCC)   . Hypertension   . Hypothyroidism   . PONV (postoperative nausea and vomiting)    Past Surgical History:  Procedure Laterality Date  . ABDOMINAL HYSTERECTOMY    . CHOLECYSTECTOMY N/A 03/02/2015   Procedure: LAPAROSCOPIC CHOLECYSTECTOMY;  Surgeon: Franky Macho, MD;  Location: [facility];  Service: General;  Laterality: N/A;  . HOLMIUM LASER APPLICATION Right 05/02/2019   Procedure: HOLMIUM LASER APPLICATION;  Surgeon: Marcine Matar, MD;  Location: [facility];  Service: Urology;  Laterality: Right;  . IR URETERAL STENT RIGHT NEW ACCESS W/O SEP NEPHROSTOMY CATH  05/02/2019  . KNEE ARTHROSCOPY Right   . LIVER BIOPSY  03/02/2015   Procedure: LAPAROSCOPIC LIVER BIOPSY;  Surgeon: Franky Macho, MD;  Location: [facility];  Service: General;;  . NEPHROLITHOTOMY Right 05/02/2019   Procedure: NEPHROLITHOTOMY PERCUTANEOUS;  Surgeon: Marcine Matar, MD;  Location: [facility];  Service: Urology;  Laterality: Right;  3 HRS  . THYROIDECTOMY    . TONSILLECTOMY  1970  . TUBAL LIGATION     Allergies  Allergen Reactions  . Amlodipine     Swelling  . Keflex [Cephalexin] Rash   No current facility-administered medications on file prior to encounter.   Current Outpatient Medications on File Prior to Encounter  Medication Sig Dispense  Refill  . ALPRAZolam (XANAX) 0.5 MG tablet Take 0.5 mg by mouth 2 (two) times daily as needed. (Patient not taking: Reported on 02/04/2020)    . APPLE CIDER VINEGAR PO Take 1 tablet by mouth daily.    . cetirizine (ZYRTEC) 10 MG tablet Take 10 mg by mouth daily.    Marland Kitchen CINNAMON PO Take 1,000 mg by mouth daily.    . cyclobenzaprine (FLEXERIL) 10 MG tablet Take 10 mg by mouth 3 (three) times daily as needed for muscle spasms.    Marland Kitchen escitalopram (LEXAPRO) 5 MG tablet Take 5 mg by mouth daily.    Marland Kitchen levothyroxine (SYNTHROID) 150 MCG tablet Take 150 mcg by mouth daily.    Marland Kitchen levothyroxine (SYNTHROID) 175 MCG tablet Take 175 mcg by mouth daily.    Marland Kitchen levothyroxine (SYNTHROID) 200 MCG tablet Take 200 mcg by mouth daily before breakfast.  (Patient not taking: Reported on 02/04/2020)    . meloxicam (MOBIC) 15 MG tablet Take 15 mg by mouth as needed.     . metFORMIN (GLUCOPHAGE) 1000 MG tablet Take 1,000 mg by mouth 2 (two) times daily.    . metFORMIN (GLUCOPHAGE) 500 MG tablet Take 500 mg by mouth 2 (two) times daily with a meal.    . naproxen sodium (ANAPROX) 220 MG tablet Take 220 mg by mouth 2 (two) times daily with a meal.    . nitrofurantoin (MACRODANTIN) 50 MG capsule Take 50 mg by mouth 4 (four) times daily. (Patient not taking:  Reported on 02/04/2020)    . olmesartan (BENICAR) 40 MG tablet Take 40 mg by mouth daily.    . Omega-3 Fatty Acids (FISH OIL) 1000 MG CAPS Take 1 capsule by mouth daily.    Marland Kitchen oxybutynin (DITROPAN) 5 MG tablet Take 1 tablet (5 mg total) by mouth every 8 (eight) hours as needed for bladder spasms. 30 tablet 0  . oxyCODONE-acetaminophen (PERCOCET) 7.5-325 MG per tablet Take 1-2 tablets by mouth every 4 (four) hours as needed. (Patient not taking: Reported on 05/02/2019) 50 tablet 0  . pregabalin (LYRICA) 200 MG capsule     . simvastatin (ZOCOR) 20 MG tablet Take 20 mg by mouth daily.    . valsartan (DIOVAN) 320 MG tablet Take 320 mg by mouth daily. (Patient not taking: Reported on  02/04/2020)     Social History   Socioeconomic History  . Marital status: Widowed    Spouse name: Not on file  . Number of children: Not on file  . Years of education: Not on file  . Highest education level: Not on file  Occupational History  . Not on file  Tobacco Use  . Smoking status: Never Smoker  . Smokeless tobacco: Never Used  Vaping Use  . Vaping Use: Never used  Substance and Sexual Activity  . Alcohol use: No  . Drug use: No  . Sexual activity: Not on file  Other Topics Concern  . Not on file  Social History Narrative  . Not on file   Social Determinants of Health   Financial Resource Strain: Not on file  Food Insecurity: Not on file  Transportation Needs: Not on file  Physical Activity: Not on file  Stress: Not on file  Social Connections: Not on file  Intimate Partner Violence: Not on file   History reviewed. No pertinent family history.  OBJECTIVE:  Vitals:   11/12/20 1207  BP: (!) 143/84  Pulse: 94  Resp: 18  Temp: 97.6 F (36.4 C)  TempSrc: Oral  SpO2: 96%     General appearance: alert; appears fatigued, but nontoxic; speaking in full sentences and tolerating own secretions HEENT: NCAT; Ears: EACs clear, TMs pearly gray; Eyes: PERRL.  EOM grossly intact.  Nose: nares patent without rhinorrhea, Throat: oropharynx clear, tonsils non erythematous or enlarged, uvula midline  Neck: supple without LAD Lungs: unlabored respirations, symmetrical air entry; cough: mild; no respiratory distress; CTAB Heart: regular rate and rhythm.   Abdomen: soft, nondistended, normal active bowel sounds; nontender to palpation; no guarding  Skin: warm and dry Psychological: alert and cooperative; normal mood and affect  LABS:  Results for orders placed or performed during the hospital encounter of 11/12/20 (from the past 24 hour(s))  POCT CBG (manual entry)     Status: Abnormal   Collection Time: 11/12/20 12:19 PM  Result Value Ref Range   POCT Glucose (KUC) 188  (A) 70 - 99 mg/dL     ASSESSMENT & PLAN:  1. Nausea without vomiting   2. Diarrhea, unspecified type   3. Viral gastroenteritis     Meds ordered this encounter  Medications  . ondansetron (ZOFRAN-ODT) disintegrating tablet 4 mg  . ondansetron (ZOFRAN) 4 MG tablet    Sig: Take 1 tablet (4 mg total) by mouth every 6 (six) hours.    Dispense:  12 tablet    Refill:  0    Order Specific Question:   Supervising Provider    Answer:   Eustace Moore [[id number]]    COVID testing  ordered.  It will take between 5-7 days for test results.  Someone will contact you regarding abnormal results.    In the meantime: You should remain isolated in your home for 5 days from symptom onset AND greater than 72 hours after symptoms resolution (absence of fever without the use of fever-reducing medication and improvement in respiratory symptoms), whichever is longer Get plenty of rest and push fluids Zofran for nausea Use OTC zyrtec for nasal congestion, runny nose, and/or sore throat Use OTC flonase for nasal congestion and runny nose Use medications daily for symptom relief Use OTC medications like ibuprofen or tylenol as needed fever or pain Call or go to the ED if you have any new or worsening symptoms such as fever, cough, shortness of breath, chest tightness, chest pain, turning blue, changes in mental status, etc...   Reviewed expectations re: course of current medical issues. Questions answered. Outlined signs and symptoms indicating need for more acute intervention. Patient verbalized understanding. After Visit Summary not given.  Patient left prior to receiving.           Rennis Harding, PA-C 11/12/20 1236

## 2020-11-12 NOTE — Discharge Instructions (Addendum)
COVID testing ordered.  It will take between 5-7 days for test results.  Someone will contact you regarding abnormal results.    In the meantime: You should remain isolated in your home for 5 days from symptom onset AND greater than 72 hours after symptoms resolution (absence of fever without the use of fever-reducing medication and improvement in respiratory symptoms), whichever is longer Get plenty of rest and push fluids Zofran for nausea Use OTC zyrtec for nasal congestion, runny nose, and/or sore throat Use OTC flonase for nasal congestion and runny nose Use medications daily for symptom relief Use OTC medications like ibuprofen or tylenol as needed fever or pain Call or go to the ED if you have any new or worsening symptoms such as fever, cough, shortness of breath, chest tightness, chest pain, turning blue, changes in mental status, etc..Marland Kitchen

## 2020-11-12 NOTE — ED Triage Notes (Signed)
Patient presents to Urgent Care with complaints of nausea and diarrhea since last night. Treating with nausea meds with no relief. Pt states she saw PCP for inner ear infection and back pain last week. Concerned that this may be related to inner ear infection states she was given an injection to treat inner ear infection.  Pt also shares  she is a new diabetic and has not checked her BG does not have a glucometer.   Denies fever, vomiting, abdominal pain, or changes meds/diet.

## 2020-11-13 LAB — SARS-COV-2, NAA 2 DAY TAT

## 2020-11-13 LAB — NOVEL CORONAVIRUS, NAA: SARS-CoV-2, NAA: DETECTED — AB

## 2020-11-14 ENCOUNTER — Telehealth: Payer: Self-pay | Admitting: Oncology

## 2020-11-14 NOTE — Telephone Encounter (Signed)
Called to discuss with patient about COVID-19 symptoms and the use of one of the available treatments for those with mild to moderate Covid symptoms and at a high risk of hospitalization.  Pt appears to qualify for outpatient treatment due to co-morbid conditions and/or a member of an at-risk group in accordance with the FDA Emergency Use Authorization.    Symptom onset: 11/11/20 Vaccinated: Unsure Booster? Unsure Immunocompromised? No  Qualifiers:  Past Medical History:  Diagnosis Date  . Arthritis   . Diabetes mellitus without complication (HCC)   . Hypertension   . Hypothyroidism   . PONV (postoperative nausea and vomiting)     NIH Criteria: Tier 1  Unable to reach pt - Left VM   Mauro Kaufmann

## 2020-11-16 ENCOUNTER — Encounter (HOSPITAL_COMMUNITY): Payer: Self-pay | Admitting: *Deleted

## 2020-11-16 ENCOUNTER — Emergency Department (HOSPITAL_COMMUNITY)
Admission: EM | Admit: 2020-11-16 | Discharge: 2020-11-16 | Disposition: A | Discharge status: Home / Self Care | Payer: PPO | Attending: Emergency Medicine | Admitting: Emergency Medicine

## 2020-11-16 ENCOUNTER — Other Ambulatory Visit: Payer: Self-pay

## 2020-11-16 DIAGNOSIS — I10 Essential (primary) hypertension: Secondary | ICD-10-CM | POA: Insufficient documentation

## 2020-11-16 DIAGNOSIS — E039 Hypothyroidism, unspecified: Secondary | ICD-10-CM | POA: Insufficient documentation

## 2020-11-16 DIAGNOSIS — R197 Diarrhea, unspecified: Secondary | ICD-10-CM | POA: Diagnosis not present

## 2020-11-16 DIAGNOSIS — Z28310 Unvaccinated for covid-19: Secondary | ICD-10-CM | POA: Insufficient documentation

## 2020-11-16 DIAGNOSIS — U071 COVID-19: Secondary | ICD-10-CM | POA: Insufficient documentation

## 2020-11-16 DIAGNOSIS — Z79899 Other long term (current) drug therapy: Secondary | ICD-10-CM | POA: Diagnosis not present

## 2020-11-16 DIAGNOSIS — E119 Type 2 diabetes mellitus without complications: Secondary | ICD-10-CM | POA: Diagnosis not present

## 2020-11-16 DIAGNOSIS — Z7984 Long term (current) use of oral hypoglycemic drugs: Secondary | ICD-10-CM | POA: Diagnosis not present

## 2020-11-16 LAB — CBC WITH DIFFERENTIAL/PLATELET
Abs Immature Granulocytes: 0.08 10*3/uL — ABNORMAL HIGH (ref 0.00–0.07)
Basophils Absolute: 0 10*3/uL (ref 0.0–0.1)
Basophils Relative: 0 %
Eosinophils Absolute: 0 10*3/uL (ref 0.0–0.5)
Eosinophils Relative: 0 %
HCT: 40.5 % (ref 36.0–46.0)
Hemoglobin: 13.4 g/dL (ref 12.0–15.0)
Immature Granulocytes: 1 %
Lymphocytes Relative: 15 %
Lymphs Abs: 2.2 10*3/uL (ref 0.7–4.0)
MCH: 29.5 pg (ref 26.0–34.0)
MCHC: 33.1 g/dL (ref 30.0–36.0)
MCV: 89.2 fL (ref 80.0–100.0)
Monocytes Absolute: 1.3 10*3/uL — ABNORMAL HIGH (ref 0.1–1.0)
Monocytes Relative: 9 %
Neutro Abs: 11 10*3/uL — ABNORMAL HIGH (ref 1.7–7.7)
Neutrophils Relative %: 75 %
Platelets: 339 10*3/uL (ref 150–400)
RBC: 4.54 MIL/uL (ref 3.87–5.11)
RDW: 15.1 % (ref 11.5–15.5)
WBC: 14.6 10*3/uL — ABNORMAL HIGH (ref 4.0–10.5)
nRBC: 0 % (ref 0.0–0.2)

## 2020-11-16 LAB — COMPREHENSIVE METABOLIC PANEL
ALT: 41 U/L (ref 0–44)
AST: 48 U/L — ABNORMAL HIGH (ref 15–41)
Albumin: 3.1 g/dL — ABNORMAL LOW (ref 3.5–5.0)
Alkaline Phosphatase: 49 U/L (ref 38–126)
Anion gap: 10 (ref 5–15)
BUN: 29 mg/dL — ABNORMAL HIGH (ref 8–23)
CO2: 22 mmol/L (ref 22–32)
Calcium: 7.8 mg/dL — ABNORMAL LOW (ref 8.9–10.3)
Chloride: 100 mmol/L (ref 98–111)
Creatinine, Ser: 0.93 mg/dL (ref 0.44–1.00)
GFR, Estimated: >60 mL/min (ref >60)
Glucose, Bld: 162 mg/dL — ABNORMAL HIGH (ref 70–99)
Potassium: 3.7 mmol/L (ref 3.5–5.1)
Sodium: 132 mmol/L — ABNORMAL LOW (ref 135–145)
Total Bilirubin: 1 mg/dL (ref 0.3–1.2)
Total Protein: 7 g/dL (ref 6.5–8.1)

## 2020-11-16 LAB — LIPASE, BLOOD: Lipase: 30 U/L (ref 11–51)

## 2020-11-16 MED ORDER — SODIUM CHLORIDE 0.9 % IV BOLUS
500.0000 mL | Freq: Once | INTRAVENOUS | Status: AC
  Administered 2020-11-16: 500 mL via INTRAVENOUS

## 2020-11-16 MED ORDER — NIRMATRELVIR/RITONAVIR (PAXLOVID)TABLET: 3.0000 | ORAL_TABLET | Freq: Two times a day (BID) | ORAL | 0 refills | Status: AC

## 2020-11-16 NOTE — ED Provider Notes (Signed)
[facility] EMERGENCY DEPARTMENT Provider Note   CSN: [id number] Arrival date & time: 11/16/20  1315     History Chief Complaint  Patient presents with  . Diarrhea    Patricia [name] is a 70 y.o. female.  She is here with a complaint of loose stool for the past 5 days.  She has had 2 loose bowel movements a day.  She went to urgent care and they gave her a prescription for Zofran.  She is tried Lomotil twice during this time.  She has not seen any blood in her stool.  No fever.  She is got a nonproductive cough.  No abdominal pain.  No urinary symptoms.  When she went to urgent care they did a COVID swab but she has not heard back from them.  She is not COVID vaccinated.  No recent antibiotics.  No recent travel or sick contacts.  The history is provided by the patient.  Diarrhea Quality:  Watery Severity:  Moderate Onset quality:  Gradual Number of episodes:  10 Duration:  5 days Timing:  Sporadic Progression:  Unchanged Relieved by:  Nothing Worsened by:  Nothing Ineffective treatments:  Anti-motility medications Associated symptoms: cough and vomiting   Associated symptoms: no abdominal pain, no chills, no fever, no headaches and no myalgias   Risk factors: no recent antibiotic use and no sick contacts        Past Medical History:  Diagnosis Date  . Arthritis   . Diabetes mellitus without complication (HCC)   . Hypertension   . Hypothyroidism   . PONV (postoperative nausea and vomiting)     Patient Active Problem List   Diagnosis Date Noted  . Calculus of kidney 05/02/2019  . Pain in thoracic spine 12/26/2013  . Lumbago 12/26/2013  . Stiffness of joint, not elsewhere classified, pelvic region and thigh 12/26/2013  . Abnormality of gait 12/26/2013    Past Surgical History:  Procedure Laterality Date  . ABDOMINAL HYSTERECTOMY    . CHOLECYSTECTOMY N/A 03/02/2015   Procedure: LAPAROSCOPIC CHOLECYSTECTOMY;  Surgeon: Franky Macho, MD;  Location: [facility];   Service: General;  Laterality: N/A;  . HOLMIUM LASER APPLICATION Right 05/02/2019   Procedure: HOLMIUM LASER APPLICATION;  Surgeon: Marcine Matar, MD;  Location: [facility];  Service: Urology;  Laterality: Right;  . IR URETERAL STENT RIGHT NEW ACCESS W/O SEP NEPHROSTOMY CATH  05/02/2019  . KNEE ARTHROSCOPY Right   . LIVER BIOPSY  03/02/2015   Procedure: LAPAROSCOPIC LIVER BIOPSY;  Surgeon: Franky Macho, MD;  Location: [facility];  Service: General;;  . NEPHROLITHOTOMY Right 05/02/2019   Procedure: NEPHROLITHOTOMY PERCUTANEOUS;  Surgeon: Marcine Matar, MD;  Location: [facility];  Service: Urology;  Laterality: Right;  3 HRS  . THYROIDECTOMY    . TONSILLECTOMY  1970  . TUBAL LIGATION       OB History   No obstetric history on file.     No family history on file.  Social History   Tobacco Use  . Smoking status: Never Smoker  . Smokeless tobacco: Never Used  Vaping Use  . Vaping Use: Never used  Substance Use Topics  . Alcohol use: No  . Drug use: No    Home Medications Prior to Admission medications   Medication Sig Start Date End Date Taking? Authorizing Provider  ALPRAZolam Prudy Feeler) 0.5 MG tablet Take 0.5 mg by mouth 2 (two) times daily as needed. Patient not taking: Reported on 02/04/2020 04/27/19   [provider]  APPLE CIDER VINEGAR  PO Take 1 tablet by mouth daily.    [provider]  cetirizine (ZYRTEC) 10 MG tablet Take 10 mg by mouth daily.    [provider]  CINNAMON PO Take 1,000 mg by mouth daily.    [provider]  cyclobenzaprine (FLEXERIL) 10 MG tablet Take 10 mg by mouth 3 (three) times daily as needed for muscle spasms.    [provider]  escitalopram (LEXAPRO) 5 MG tablet Take 5 mg by mouth daily. 07/17/19   [provider]  levothyroxine (SYNTHROID) 150 MCG tablet Take 150 mcg by mouth daily. 07/05/19   [provider]  levothyroxine (SYNTHROID) 175 MCG tablet Take 175 mcg by mouth daily. 08/27/19    [provider]  levothyroxine (SYNTHROID) 200 MCG tablet Take 200 mcg by mouth daily before breakfast.  Patient not taking: Reported on 02/04/2020    [provider]  meloxicam (MOBIC) 15 MG tablet Take 15 mg by mouth as needed.     [provider]  metFORMIN (GLUCOPHAGE) 1000 MG tablet Take 1,000 mg by mouth 2 (two) times daily. 11/25/19   [provider]  metFORMIN (GLUCOPHAGE) 500 MG tablet Take 500 mg by mouth 2 (two) times daily with a meal.    [provider]  naproxen sodium (ANAPROX) 220 MG tablet Take 220 mg by mouth 2 (two) times daily with a meal.    [provider]  nitrofurantoin (MACRODANTIN) 50 MG capsule Take 50 mg by mouth 4 (four) times daily. Patient not taking: Reported on 02/04/2020 04/11/19   [provider]  olmesartan (BENICAR) 40 MG tablet Take 40 mg by mouth daily. 04/05/19   [provider]  Omega-3 Fatty Acids (FISH OIL) 1000 MG CAPS Take 1 capsule by mouth daily.    [provider]  ondansetron (ZOFRAN) 4 MG tablet Take 1 tablet (4 mg total) by mouth every 6 (six) hours. 11/12/20   Wurst, [place], PA-C  oxybutynin (DITROPAN) 5 MG tablet Take 1 tablet (5 mg total) by mouth every 8 (eight) hours as needed for bladder spasms. 05/03/19   Marcine Matar, MD  oxyCODONE-acetaminophen (PERCOCET) 7.5-325 MG per tablet Take 1-2 tablets by mouth every 4 (four) hours as needed. Patient not taking: Reported on 05/02/2019 03/02/15   Franky Macho, MD  pregabalin (LYRICA) 200 MG capsule  05/27/19   [provider]  simvastatin (ZOCOR) 20 MG tablet Take 20 mg by mouth daily.    [provider]  valsartan (DIOVAN) 320 MG tablet Take 320 mg by mouth daily. Patient not taking: Reported on 02/04/2020    [provider]    Allergies    Amlodipine and Keflex [cephalexin]  Review of Systems   Review of Systems  Constitutional: Negative for chills and fever.  HENT: Negative for  sore throat.   Eyes: Negative for visual disturbance.  Respiratory: Negative for shortness of breath.   Cardiovascular: Negative for chest pain.  Gastrointestinal: Positive for diarrhea and vomiting. Negative for abdominal pain.  Genitourinary: Negative for dysuria.  Musculoskeletal: Negative for myalgias.  Skin: Negative for rash.  Neurological: Negative for headaches.    Physical Exam Updated Vital Signs BP 107/66 (BP Location: Right Arm)   Pulse 99   Temp 98.3 F (36.8 C) (Oral)   Resp 20   Ht 5\' 5"  (1.651 m)   Wt 78 kg   SpO2 90%   BMI 28.62 kg/m   Physical Exam Vitals and nursing note reviewed.  Constitutional:  General: She is not in acute distress.    Appearance: Normal appearance. She is well-developed.  HENT:     Head: Normocephalic and atraumatic.  Eyes:     Conjunctiva/sclera: Conjunctivae normal.  Cardiovascular:     Rate and Rhythm: Normal rate and regular rhythm.     Heart sounds: No murmur heard.   Pulmonary:     Effort: Pulmonary effort is normal. No respiratory distress.     Breath sounds: Normal breath sounds.  Abdominal:     Palpations: Abdomen is soft.     Tenderness: There is no abdominal tenderness. There is no guarding or rebound.  Musculoskeletal:        General: No deformity or signs of injury. Normal range of motion.     Cervical back: Neck supple.  Skin:    General: Skin is warm and dry.  Neurological:     General: No focal deficit present.     Mental Status: She is alert.     ED Results / Procedures / Treatments   Labs (all labs ordered are listed, but only abnormal results are displayed) Labs Reviewed  COMPREHENSIVE METABOLIC PANEL - Abnormal; Notable for the following components:      Result Value   Sodium 132 (*)    Glucose, Bld 162 (*)    BUN 29 (*)    Calcium 7.8 (*)    Albumin 3.1 (*)    AST 48 (*)    All other components within normal limits  CBC WITH DIFFERENTIAL/PLATELET - Abnormal; Notable for the following  components:   WBC 14.6 (*)    Neutro Abs 11.0 (*)    Monocytes Absolute 1.3 (*)    Abs Immature Granulocytes 0.08 (*)    All other components within normal limits  LIPASE, BLOOD    EKG None  Radiology No results found.  Procedures Procedures   Medications Ordered in ED Medications  sodium chloride 0.9 % bolus 500 mL (has no administration in time range)    ED Course  I have reviewed the triage vital signs and the nursing notes.  Pertinent labs & imaging results that were available during my care of the patient were reviewed by me and considered in my medical decision making (see chart for details).  Clinical Course as of 11/17/20 0950  Mon Nov 16, 2020  1636 Was able to see the COVID test from urgent care has come back positive.  I informed the patient. [MB]  1853 Patient states she feels better and wants to go home.  Her sats have been between 90 and 94% and she is not short of breath.  We will see if she would be a candidate for  [MB]  1858 Blood pressure is soft here although she is not feeling any dizziness.  She sitting up in bed completely awake and alert. [MB]  1900 Reviewed starting PACs lipid with pharmacy.  Pharmacist recommends starting the Paxilovid 12 hours after the last dose of the simvastatin and she should not take the simvastatin until she finishes her Paxlovid [MB]    Clinical Course User Index [MB] Terrilee FilesButler, Deforrest Bogle C, MD   MDM Rules/Calculators/A&P                         Marisa HuaDebbie A Kirwan was evaluated in Emergency Department on 11/16/2020 for the symptoms described in the history of present illness. She was evaluated in the context of the global COVID-19 pandemic, which necessitated consideration that the  patient might be at risk for infection with the SARS-CoV-2 virus that causes COVID-19. Institutional protocols and algorithms that pertain to the evaluation of patients at risk for COVID-19 are in a state of rapid change based on information released by  regulatory bodies including the CDC and federal and state organizations. These policies and algorithms were followed during the patient's care in the ED.  This patient complains of diarrhea and cough; this involves an extensive number of treatment Options and is a complaint that carries with it a high risk of complications and Morbidity. The differential includes COVID, gastroenteritis, pneumonia, metabolic derangement, hypovolemia  I ordered, reviewed and interpreted labs, which included CBC with elevated white count normal hemoglobin, chemistries fairly normal other than mildly low sodium elevated glucose elevated BUN I ordered medication IV fluids with improvement in her blood pressure Previous records obtained and reviewed in epic, COVID positive per urgent care visit few days ago  After the interventions stated above, I reevaluated the patient and found patient to be fairly asymptomatic.  Blood pressure little soft although not symptomatic.  Reviewed with pharmacy patient's medication lists and they recommend that she hold her simvastatin while on the Paxlovid.  Reviewed instructions with patient.  Return instructions discussed.   Final Clinical Impression(s) / ED Diagnoses Final diagnoses:  COVID-19 virus infection  Diarrhea, unspecified type    Rx / DC Orders ED Discharge Orders         Ordered    nirmatrelvir/ritonavir EUA (PAXLOVID) TABS  2 times daily        11/16/20 1902           Terrilee Files, MD 11/17/20 [phone]

## 2020-11-16 NOTE — ED Triage Notes (Signed)
Diarrhea for the past 5 days, seen at urgent care for same

## 2020-11-16 NOTE — Discharge Instructions (Addendum)
You were seen in the emergency department for 5 days of diarrhea and a cough.  Your COVID test that you had done at urgent care was positive for COVID.  You can continue to use the Imodium.  Drink plenty of fluids.  You must not start the paxlovid until eat least 12 hours after the last dose of your simvastatin cholesterol medication.  Do not take the cholesterol medicine while you are taking this new medication.  You may restart the cholesterol medicine after you have finished the paxlovid.  Return to the emergency department if any worsening or concerning symptoms

## 2020-11-17 ENCOUNTER — Telehealth: Payer: Self-pay | Admitting: *Deleted

## 2020-11-17 NOTE — Telephone Encounter (Signed)
Pt called regarding which pharmacy Rx was e-scribed to.  RNCM reviewed chart to access After Visit Summary and found that Rx was printed.  Advised pt to located Rx and take to pharmacy of choice.

## 2021-02-01 NOTE — Progress Notes (Signed)
History of Present Illness: Patricia [name] is a 70 y.o. year old female here for follow-up.  History of urolithiasis.  She has not passed a stone in the past year.  She has no gross hematuria, no dysuria.  She does have frequency, urgency and occasional urgency incontinence which is quite bothersome to her.  She does wear incontinence pads, 2-3 a day on some days.  Past Medical History:  Diagnosis Date   Arthritis    Diabetes mellitus without complication (HCC)    Hypertension    Hypothyroidism    PONV (postoperative nausea and vomiting)     Past Surgical History:  Procedure Laterality Date   ABDOMINAL HYSTERECTOMY     CHOLECYSTECTOMY N/A 03/02/2015   Procedure: LAPAROSCOPIC CHOLECYSTECTOMY;  Surgeon: Franky Macho, MD;  Location: [facility];  Service: General;  Laterality: N/A;   HOLMIUM LASER APPLICATION Right 05/02/2019   Procedure: HOLMIUM LASER APPLICATION;  Surgeon: Marcine Matar, MD;  Location: [facility];  Service: Urology;  Laterality: Right;   IR URETERAL STENT RIGHT NEW ACCESS W/O SEP NEPHROSTOMY CATH  05/02/2019   KNEE ARTHROSCOPY Right    LIVER BIOPSY  03/02/2015   Procedure: LAPAROSCOPIC LIVER BIOPSY;  Surgeon: Franky Macho, MD;  Location: [facility];  Service: General;;   NEPHROLITHOTOMY Right 05/02/2019   Procedure: NEPHROLITHOTOMY PERCUTANEOUS;  Surgeon: Marcine Matar, MD;  Location: [facility];  Service: Urology;  Laterality: Right;  3 HRS   THYROIDECTOMY     TONSILLECTOMY  1970   TUBAL LIGATION      Home Medications:  (Not in a hospital admission)   Allergies:  Allergies  Allergen Reactions   Amlodipine     Swelling   Keflex [Cephalexin] Rash    No family history on file.  Social History:  reports that she has never smoked. She has never used smokeless tobacco. She reports that she does not drink alcohol and does not use drugs.  ROS: A complete review of systems was performed.  All systems are negative except for pertinent findings as noted.  Physical  Exam:  Vital signs in last 24 hours: @VSRANGES @ General:  Alert and oriented, No acute distress HEENT: Normocephalic, atraumatic Neck: No JVD or lymphadenopathy Cardiovascular: Regular rate  Lungs: Normal inspiratory/expiratory excursion  Extremities: No edema Neurologic: Grossly intact  I have reviewed prior pt notes  I have reviewed urinalysis results  I have independently reviewed prior imaging--CT scan from 2020 reviewed  Impression/Assessment:  1.  History of urolithiasis, no stone episodes recently  2.  Overactive bladder-wet  Plan:  1.  I gave her an overactive bladder guide sheet  2.  She also wants medical therapy, I have started her on Solifenacin 10 mg a day  3.  I will see her back in 1 year.  We will do a KUB at that time.  2021 Brynnlie Unterreiner 02/01/2021, 8:04 PM  02/03/2021. Christiano Blandon MD

## 2021-02-02 ENCOUNTER — Encounter: Payer: Self-pay | Admitting: Urology

## 2021-02-02 ENCOUNTER — Ambulatory Visit (INDEPENDENT_AMBULATORY_CARE_PROVIDER_SITE_OTHER): Payer: PPO | Admitting: Urology

## 2021-02-02 ENCOUNTER — Other Ambulatory Visit: Payer: Self-pay

## 2021-02-02 VITALS — BP 152/91 | HR 106

## 2021-02-02 DIAGNOSIS — N3281 Overactive bladder: Secondary | ICD-10-CM | POA: Diagnosis not present

## 2021-02-02 DIAGNOSIS — N200 Calculus of kidney: Secondary | ICD-10-CM

## 2021-02-02 LAB — URINALYSIS, ROUTINE W REFLEX MICROSCOPIC
Bilirubin, UA: NEGATIVE
Glucose, UA: NEGATIVE
Ketones, UA: NEGATIVE
Nitrite, UA: NEGATIVE
Protein,UA: NEGATIVE
RBC, UA: NEGATIVE
Specific Gravity, UA: 1.025 (ref 1.005–1.030)
Urobilinogen, Ur: 1 mg/dL (ref 0.2–1.0)
pH, UA: 5.5 (ref 5.0–7.5)

## 2021-02-02 LAB — MICROSCOPIC EXAMINATION
RBC, Urine: NONE SEEN /hpf (ref 0–2)
Renal Epithel, UA: NONE SEEN /hpf

## 2021-02-02 MED ORDER — SOLIFENACIN SUCCINATE 10 MG PO TABS: 10.0000 mg | ORAL_TABLET | Freq: Every day | ORAL | 11 refills | Status: DC

## 2021-02-02 NOTE — Progress Notes (Signed)
Urological Symptom Review ° °Patient is experiencing the following symptoms: °Frequent urination °Get up at night to urinate °Leakage of urine ° ° °Review of Systems ° °Gastrointestinal (upper)  : °Negative for upper GI symptoms ° °Gastrointestinal (lower) : °Negative for lower GI symptoms ° °Constitutional : °Negative for symptoms ° °Skin: °Negative for skin symptoms ° °Eyes: °Negative for eye symptoms ° °Ear/Nose/Throat : °Negative for Ear/Nose/Throat symptoms ° °Hematologic/Lymphatic: °Negative for Hematologic/Lymphatic symptoms ° °Cardiovascular : °Negative for cardiovascular symptoms ° °Respiratory : °Negative for respiratory symptoms ° °Endocrine: °Negative for endocrine symptoms ° °Musculoskeletal: °Back pain °Joint pain ° °Neurological: °Negative for neurological symptoms ° °Psychologic: °Negative for psychiatric symptoms ° °

## 2021-02-25 DIAGNOSIS — Z23 Encounter for immunization: Secondary | ICD-10-CM | POA: Diagnosis not present

## 2021-03-29 ENCOUNTER — Other Ambulatory Visit (HOSPITAL_COMMUNITY): Payer: Self-pay | Admitting: Family Medicine

## 2021-03-29 DIAGNOSIS — Z1231 Encounter for screening mammogram for malignant neoplasm of breast: Secondary | ICD-10-CM

## 2021-04-07 ENCOUNTER — Encounter (HOSPITAL_COMMUNITY): Payer: Self-pay

## 2021-04-07 ENCOUNTER — Other Ambulatory Visit: Payer: Self-pay

## 2021-04-07 ENCOUNTER — Ambulatory Visit (HOSPITAL_COMMUNITY)
Admission: RE | Admit: 2021-04-07 | Discharge: 2021-04-07 | Disposition: A | Discharge status: Home / Self Care | Payer: PPO | Source: Ambulatory Visit | Attending: Family Medicine | Admitting: Family Medicine

## 2021-04-07 DIAGNOSIS — Z1231 Encounter for screening mammogram for malignant neoplasm of breast: Secondary | ICD-10-CM | POA: Insufficient documentation

## 2021-05-03 DIAGNOSIS — Z6828 Body mass index (BMI) 28.0-28.9, adult: Secondary | ICD-10-CM | POA: Diagnosis not present

## 2021-05-03 DIAGNOSIS — E890 Postprocedural hypothyroidism: Secondary | ICD-10-CM | POA: Diagnosis not present

## 2021-05-03 DIAGNOSIS — S99921D Unspecified injury of right foot, subsequent encounter: Secondary | ICD-10-CM | POA: Diagnosis not present

## 2021-05-03 DIAGNOSIS — E782 Mixed hyperlipidemia: Secondary | ICD-10-CM | POA: Diagnosis not present

## 2021-05-03 DIAGNOSIS — E663 Overweight: Secondary | ICD-10-CM | POA: Diagnosis not present

## 2021-05-03 DIAGNOSIS — R251 Tremor, unspecified: Secondary | ICD-10-CM | POA: Diagnosis not present

## 2021-05-03 DIAGNOSIS — N390 Urinary tract infection, site not specified: Secondary | ICD-10-CM | POA: Diagnosis not present

## 2021-05-03 DIAGNOSIS — E1140 Type 2 diabetes mellitus with diabetic neuropathy, unspecified: Secondary | ICD-10-CM | POA: Diagnosis not present

## 2021-05-03 DIAGNOSIS — M1991 Primary osteoarthritis, unspecified site: Secondary | ICD-10-CM | POA: Diagnosis not present

## 2021-05-20 DIAGNOSIS — Z1212 Encounter for screening for malignant neoplasm of rectum: Secondary | ICD-10-CM | POA: Diagnosis not present

## 2021-06-24 ENCOUNTER — Encounter (INDEPENDENT_AMBULATORY_CARE_PROVIDER_SITE_OTHER): Payer: Self-pay | Admitting: *Deleted

## 2021-07-01 ENCOUNTER — Ambulatory Visit
Admission: EM | Admit: 2021-07-01 | Discharge: 2021-07-01 | Disposition: A | Discharge status: Home / Self Care | Payer: PPO | Attending: Urgent Care | Admitting: Urgent Care

## 2021-07-01 ENCOUNTER — Other Ambulatory Visit: Payer: Self-pay

## 2021-07-01 DIAGNOSIS — Z1152 Encounter for screening for COVID-19: Secondary | ICD-10-CM

## 2021-07-01 DIAGNOSIS — R197 Diarrhea, unspecified: Secondary | ICD-10-CM | POA: Diagnosis not present

## 2021-07-01 DIAGNOSIS — K529 Noninfective gastroenteritis and colitis, unspecified: Secondary | ICD-10-CM

## 2021-07-01 MED ORDER — ONDANSETRON 8 MG PO TBDP: 8.0000 mg | ORAL_TABLET | Freq: Three times a day (TID) | ORAL | 0 refills | Status: DC | PRN

## 2021-07-01 NOTE — Discharge Instructions (Signed)
Make sure you push fluids drinking mostly water but mix it with Gatorade.  Try to eat light meals including soups, broths and soft foods, fruits.  You may use Zofran for your nausea and vomiting once every 8 hours.  Imodium (loperamide) can help with diarrhea but use this carefully limiting it to 1-2 times per day only if you are having a lot of diarrhea.  Please return to the clinic if symptoms worsen or you start having severe abdominal pain not helped by taking Tylenol or start having bloody stools or blood in the vomit. 

## 2021-07-01 NOTE — ED Triage Notes (Signed)
Pt presents with c/o diarrhea that began last night, took imodium at 9 am this morning.

## 2021-07-01 NOTE — ED Provider Notes (Signed)
[place]   MRN: [id number] DOB: [birth year]  Subjective:   Patricia [name] is a 71 y.o. female presenting for 1 day history of acute onset chills, diarrhea.  Has been pretty persistent overnight.  This morning she woke up and took Imodium has improved.  No fever, nausea, vomiting, bloody stools.  No recent hospitalizations, antibiotic use, long distance travel.  Has not needed any raw foods.  No history of GI issues.  No current facility-administered medications for this encounter.  Current Outpatient Medications:    APPLE CIDER VINEGAR PO, Take 1 tablet by mouth daily., Disp: , Rfl:    cetirizine (ZYRTEC) 10 MG tablet, Take 10 mg by mouth daily., Disp: , Rfl:    CINNAMON PO, Take 1,000 mg by mouth daily., Disp: , Rfl:    cyclobenzaprine (FLEXERIL) 10 MG tablet, Take 10 mg by mouth 3 (three) times daily as needed for muscle spasms., Disp: , Rfl:    escitalopram (LEXAPRO) 5 MG tablet, Take 5 mg by mouth daily., Disp: , Rfl:    levothyroxine (SYNTHROID) 150 MCG tablet, Take 150 mcg by mouth daily., Disp: , Rfl:    meclizine (ANTIVERT) 25 MG tablet, Take 25 mg by mouth daily as needed for nausea., Disp: , Rfl:    meloxicam (MOBIC) 15 MG tablet, Take 15 mg by mouth as needed for pain., Disp: , Rfl:    metFORMIN (GLUCOPHAGE) 1000 MG tablet, Take 1,000 mg by mouth 2 (two) times daily., Disp: , Rfl:    naproxen sodium (ANAPROX) 220 MG tablet, Take 220 mg by mouth daily as needed (pain)., Disp: , Rfl:    olmesartan (BENICAR) 40 MG tablet, Take 40 mg by mouth daily., Disp: , Rfl:    Omega-3 Fatty Acids (FISH OIL) 1000 MG CAPS, Take 1 capsule by mouth daily., Disp: , Rfl:    ondansetron (ZOFRAN) 4 MG tablet, Take 1 tablet (4 mg total) by mouth every 6 (six) hours., Disp: 12 tablet, Rfl: 0   predniSONE (DELTASONE) 10 MG tablet, Take 10 mg by mouth taper from 4 doses each day to 1 dose and stop., Disp: , Rfl:    simvastatin (ZOCOR) 20 MG tablet, Take 20 mg by mouth daily., Disp: ,  Rfl:    solifenacin (VESICARE) 10 MG tablet, Take 1 tablet (10 mg total) by mouth daily., Disp: 30 tablet, Rfl: 11   Allergies  Allergen Reactions   Amlodipine     Swelling   Keflex [Cephalexin] Rash    Past Medical History:  Diagnosis Date   Arthritis    Diabetes mellitus without complication ([place])    Hypertension    Hypothyroidism    PONV (postoperative nausea and vomiting)      Past Surgical History:  Procedure Laterality Date   ABDOMINAL HYSTERECTOMY     CHOLECYSTECTOMY N/A 03/02/2015   Procedure: LAPAROSCOPIC CHOLECYSTECTOMY;  Surgeon: Aviva Signs, MD;  Location: [facility];  Service: General;  Laterality: N/A;   HOLMIUM LASER APPLICATION Right [id number]   Procedure: HOLMIUM LASER APPLICATION;  Surgeon: Franchot Gallo, MD;  Location: [facility];  Service: Urology;  Laterality: Right;   IR URETERAL STENT RIGHT NEW ACCESS W/O SEP NEPHROSTOMY CATH  05/02/2019   KNEE ARTHROSCOPY Right    LIVER BIOPSY  03/02/2015   Procedure: LAPAROSCOPIC LIVER BIOPSY;  Surgeon: Aviva Signs, MD;  Location: [facility];  Service: General;;   NEPHROLITHOTOMY Right 05/02/2019   Procedure: NEPHROLITHOTOMY PERCUTANEOUS;  Surgeon: Franchot Gallo, MD;  Location: [facility];  Service: Urology;  Laterality: Right;  3 HRS   THYROIDECTOMY     TONSILLECTOMY  1970   TUBAL LIGATION      History reviewed. No pertinent family history.  Social History   Tobacco Use   Smoking status: Never   Smokeless tobacco: Never  Vaping Use   Vaping Use: Never used  Substance Use Topics   Alcohol use: No   Drug use: No    ROS   Objective:   Vitals: BP (!) 152/92    Pulse 94    Temp 98.2 F (36.8 C)    Resp 18    SpO2 97%   Physical Exam Constitutional:      General: She is not in acute distress.    Appearance: Normal appearance. She is well-developed. She is not ill-appearing, toxic-appearing or diaphoretic.  HENT:     Head: Normocephalic and atraumatic.     Nose: Nose normal.     Mouth/Throat:      Mouth: Mucous membranes are moist.     Pharynx: Oropharynx is clear.  Eyes:     General: No scleral icterus.       Right eye: No discharge.        Left eye: No discharge.     Extraocular Movements: Extraocular movements intact.     Conjunctiva/sclera: Conjunctivae normal.     Pupils: Pupils are equal, round, and reactive to light.  Cardiovascular:     Rate and Rhythm: Normal rate and regular rhythm.     Pulses: Normal pulses.     Heart sounds: Normal heart sounds. No murmur heard.   No friction rub. No gallop.  Pulmonary:     Effort: Pulmonary effort is normal. No respiratory distress.     Breath sounds: Normal breath sounds. No stridor. No wheezing, rhonchi or rales.  Abdominal:     General: There is no distension.     Palpations: Abdomen is soft. There is no mass.     Tenderness: There is no abdominal tenderness. There is no right CVA tenderness, left CVA tenderness, guarding or rebound.     Comments: Hyperactive bowel sounds throughout.  Skin:    General: Skin is warm and dry.     Findings: No rash.  Neurological:     General: No focal deficit present.     Mental Status: She is alert and oriented to person, place, and time.  Psychiatric:        Mood and Affect: Mood normal.        Behavior: Behavior normal.        Thought Content: Thought content normal.        Judgment: Judgment normal.    Assessment and Plan :   PDMP not reviewed this encounter.  1. Colitis   2. Encounter for screening for COVID-19   3. Diarrhea, unspecified type    I do not suspect COVID-19.  Will manage with supportive care for colitis.  Low suspicion for an acute abdomen.  Rx for Zofran for nausea and vomiting, Imodium for diarrhea.  Patient is to push fluids and eat light meals including soups and soft foods. Counseled patient on potential for adverse effects with medications prescribed/recommended today, ER and return-to-clinic precautions discussed, patient verbalized understanding.    Jaynee Eagles, PA-C 07/01/21 1326

## 2021-07-02 LAB — COVID-19, FLU A+B NAA
Influenza A, NAA: NOT DETECTED
Influenza B, NAA: NOT DETECTED
SARS-CoV-2, NAA: NOT DETECTED

## 2021-08-16 ENCOUNTER — Encounter (INDEPENDENT_AMBULATORY_CARE_PROVIDER_SITE_OTHER): Payer: Self-pay | Admitting: Gastroenterology

## 2021-08-16 ENCOUNTER — Ambulatory Visit (INDEPENDENT_AMBULATORY_CARE_PROVIDER_SITE_OTHER): Payer: PPO | Admitting: Gastroenterology

## 2021-08-16 ENCOUNTER — Other Ambulatory Visit: Payer: Self-pay

## 2021-08-16 VITALS — BP 199/103 | HR 90 | Temp 98.4°F | Ht 65.0 in | Wt 178.3 lb

## 2021-08-16 DIAGNOSIS — K219 Gastro-esophageal reflux disease without esophagitis: Secondary | ICD-10-CM

## 2021-08-16 DIAGNOSIS — R195 Other fecal abnormalities: Secondary | ICD-10-CM | POA: Diagnosis not present

## 2021-08-16 NOTE — Progress Notes (Signed)
Referring Provider: Assunta Found, MD Primary Care Physician:  Assunta Found, MD Primary GI Physician: new  Chief Complaint  Patient presents with   Blood In Stools    Patient arrives as a new patient for a positive stool test. Has not seen any blood in stool. Has 1 -2 stools a day.    HPI:   Patricia [name] is a 71 y.o. female with past medical history of arthritis, DM, HTN, hypothyroidism.   Patient presenting today as a new patient for positive FOBT in December 2022.  No recent labs available for review.    Patient states that she has not had any rectal bleeding, melena, abdominal pain, constipation or diarrhea. Never had a colonoscopy in the past. She denies any dizziness, sob or fatigue. She reports that she has 1-2 BMs per day, no straining. She has occasional acid reflux if she eats something spicy but usually does well if she avoid trigger foods,  She takes zantac as needed with good result. Denies any dysphagia, odoynophagia, early satiety, nausea, vomiting. She states that she has had a little bit of of weight loss about 7 pounds over the past few months though she states that she does not eat like she should, and typically will eat something small like a sandwich since the passing of her husband as she does not like to cook for just herself.   Notably, pts BP significantly elevated in office today at 199/103, currently on benicar 40mg  daily, and reports she was prescribed another medication for HTN by PCP in December, but was not able to get it and was lost to follow up afterwards.  Denies headache, vision changes, dizziness, lightheadedness or any other symptoms related to HTN at this time.  NSAID use:no NSAID use. Social hx: no etoh or tobacco use Fam hx:no CRC  Last Colonoscopy:never Last Endoscopy:never  Past Medical History:  Diagnosis Date   Arthritis    Diabetes mellitus without complication (HCC)    Hypertension    Hypothyroidism    PONV (postoperative nausea  and vomiting)     Past Surgical History:  Procedure Laterality Date   ABDOMINAL HYSTERECTOMY     CHOLECYSTECTOMY N/A 03/02/2015   Procedure: LAPAROSCOPIC CHOLECYSTECTOMY;  Surgeon: Franky Macho, MD;  Location: [facility];  Service: General;  Laterality: N/A;   HOLMIUM LASER APPLICATION Right 05/02/2019   Procedure: HOLMIUM LASER APPLICATION;  Surgeon: Marcine Matar, MD;  Location: [facility];  Service: Urology;  Laterality: Right;   IR URETERAL STENT RIGHT NEW ACCESS W/O SEP NEPHROSTOMY CATH  05/02/2019   KNEE ARTHROSCOPY Right    LIVER BIOPSY  03/02/2015   Procedure: LAPAROSCOPIC LIVER BIOPSY;  Surgeon: Franky Macho, MD;  Location: [facility];  Service: General;;   NEPHROLITHOTOMY Right 05/02/2019   Procedure: NEPHROLITHOTOMY PERCUTANEOUS;  Surgeon: Marcine Matar, MD;  Location: [facility];  Service: Urology;  Laterality: Right;  3 HRS   THYROIDECTOMY     TONSILLECTOMY  1970   TUBAL LIGATION      Current Outpatient Medications  Medication Sig Dispense Refill   APPLE CIDER VINEGAR PO Take 1 tablet by mouth daily.     cetirizine (ZYRTEC) 10 MG tablet Take 10 mg by mouth daily.     CINNAMON PO Take 1,000 mg by mouth daily.     cyclobenzaprine (FLEXERIL) 10 MG tablet Take 10 mg by mouth 3 (three) times daily as needed for muscle spasms.     escitalopram (LEXAPRO) 5 MG tablet Take 5 mg by mouth daily.  levothyroxine (SYNTHROID) 150 MCG tablet Take 150 mcg by mouth daily.     meclizine (ANTIVERT) 25 MG tablet Take 25 mg by mouth daily as needed for nausea.     meloxicam (MOBIC) 15 MG tablet Take 15 mg by mouth as needed for pain.     metFORMIN (GLUCOPHAGE) 1000 MG tablet Take 1,000 mg by mouth 2 (two) times daily.     olmesartan (BENICAR) 40 MG tablet Take 40 mg by mouth daily.     Omega-3 Fatty Acids (FISH OIL) 1000 MG CAPS Take 1 capsule by mouth daily.     ondansetron (ZOFRAN) 4 MG tablet Take 1 tablet (4 mg total) by mouth every 6 (six) hours. 12 tablet 0   simvastatin (ZOCOR) 20 MG  tablet Take 20 mg by mouth daily.     solifenacin (VESICARE) 10 MG tablet Take 1 tablet (10 mg total) by mouth daily. 30 tablet 11   No current facility-administered medications for this visit.    Allergies as of 08/16/2021 - Review Complete 08/16/2021  Allergen Reaction Noted   Amlodipine  04/26/2019   Keflex [cephalexin] Rash 02/26/2015    History reviewed. No pertinent family history.  Social History   Socioeconomic History   Marital status: Widowed    Spouse name: Not on file   Number of children: Not on file   Years of education: Not on file   Highest education level: Not on file  Occupational History   Not on file  Tobacco Use   Smoking status: Never   Smokeless tobacco: Never  Vaping Use   Vaping Use: Never used  Substance and Sexual Activity   Alcohol use: No   Drug use: No   Sexual activity: Not on file  Other Topics Concern   Not on file  Social History Narrative   Not on file   Social Determinants of Health   Financial Resource Strain: Not on file  Food Insecurity: Not on file  Transportation Needs: Not on file  Physical Activity: Not on file  Stress: Not on file  Social Connections: Not on file   Review of systems General: negative for malaise, night sweats, fever, chills, +very mild weight loss Neck: Negative for lumps, goiter, pain and significant neck swelling Resp: Negative for cough, wheezing, dyspnea at rest CV: Negative for chest pain, leg swelling, palpitations, orthopnea GI: denies melena, hematochezia, nausea, vomiting, diarrhea, constipation, dysphagia, odyonophagia, early satiety or unintentional weight loss.  MSK: Negative for joint pain or swelling, back pain, and muscle pain. Derm: Negative for itching or rash Psych: Denies depression, anxiety, memory loss, confusion. No homicidal or suicidal ideation.  Heme: Negative for prolonged bleeding, bruising easily, and swollen nodes. Endocrine: Negative for cold or heat intolerance,  polyuria, polydipsia and goiter. Neuro: negative for tremor, gait imbalance, syncope and seizures. The remainder of the review of systems is noncontributory.  Physical Exam: BP (!) 199/103 (BP Location: Right Arm, Patient Position: Sitting, Cuff Size: Large)    Pulse 90    Temp 98.4 F (36.9 C) (Oral)    Ht 5\' 5"  (1.651 m)    Wt 178 lb 4.8 oz (80.9 kg)    BMI 29.67 kg/m  General:   Alert and oriented. No distress noted. Pleasant and cooperative.  Head:  Normocephalic and atraumatic. Eyes:  Conjuctiva clear without scleral icterus. Mouth:  Oral mucosa pink and moist. Good dentition. No lesions. Heart: Normal rate and rhythm, s1 and s2 heart sounds present.  Lungs: Clear lung sounds in all  lobes. Respirations equal and unlabored. Abdomen:  +BS, soft, non-tender and non-distended. No rebound or guarding. No HSM or masses noted. Derm: No palmar erythema or jaundice Msk:  Symmetrical without gross deformities. Normal posture. Extremities:  Without edema. Neurologic:  Alert and  oriented x4 Psych:  Alert and cooperative. Normal mood and affect.  Invalid input(s): 6 MONTHS   ASSESSMENT: Patricia [name] is a 71 y.o. female presenting today as a new patient for positive FOBT.  Patient has never had a colonoscopy done, denies any family hx of CRC. No instances of melena, hematochezia, no fatigue, dizziness, sob or lightheadedness. States she had labs done some time in December with PCP. She does endorse hx of hemorrhoids. I discussed that positive FOBT can be from hemorrhoids, bleeding colon polyps, AVMs, false positive results or, less likely malignancy. Indications, risks and benefits of procedure discussed in detail with patient. Patient verbalized understanding and is in agreement to proceed with colonoscopy at this time.   Occasional reflux symptoms related to trigger foods, usually spicy, which she tries to avoid, takes zantac PRN with good result, maybe once per month if that. Encouraged  to implement other reflux precautions (stay upright 2-3 hours after eating, avoid trigger foods.  In regards to her BP, on recheck, BP was 182/110 manually, patient is completely asymptomatic in office today. I encouraged her to call PCP today regarding her BP as it is significantly elevated despite being on Benicar. I also discussed indications that she needs to proceed to the ED for further evaluation/management of BP to include, headache, blurred vision or vision changes, dizziness, lightheadedness or syncope. She is aware that we cannot proceed with colonoscopy until BP is better managed. Daughter who is present during visit with patient advised she would make sure they contact PCP today in regards to this.  All questions were answered, patient verbalized understanding and is in agreement with plan as outline above.    PLAN:  Schedule Colonoscopy 2. Reach out to PCP today regarding BP 3. Pt to let me know if she develops any new or worsening GI symptoms 4 reflux precautions, can continue zantact PRN 5. Obtain most recent labs from PCP  Follow Up: TBD  Daeton Kluth L. Alver Sorrow, MSN, APRN, AGNP-C Adult-Gerontology Nurse Practitioner [facility]

## 2021-08-16 NOTE — Patient Instructions (Addendum)
Please make sure that you contact your PCP today as I am concerned that your BP is so elevated, we need to make sure that your BP is more controlled prior to performing Colonoscopy.  If you experience any dizziness, lightheadedness, blurred vision, or headache,these are signs that you need to proceed to ER for further immediate management of your BP.  We will get you set up for colonoscopy as discussed, for further evaluation of the positive stool card your PCP did. Please let me know if you develop any new or worsening GI symptoms.

## 2021-08-18 DIAGNOSIS — E890 Postprocedural hypothyroidism: Secondary | ICD-10-CM | POA: Diagnosis not present

## 2021-08-18 DIAGNOSIS — I10 Essential (primary) hypertension: Secondary | ICD-10-CM | POA: Diagnosis not present

## 2021-08-18 DIAGNOSIS — E782 Mixed hyperlipidemia: Secondary | ICD-10-CM | POA: Diagnosis not present

## 2021-08-18 DIAGNOSIS — R251 Tremor, unspecified: Secondary | ICD-10-CM | POA: Diagnosis not present

## 2021-08-18 DIAGNOSIS — Z0001 Encounter for general adult medical examination with abnormal findings: Secondary | ICD-10-CM | POA: Diagnosis not present

## 2021-08-18 DIAGNOSIS — E1140 Type 2 diabetes mellitus with diabetic neuropathy, unspecified: Secondary | ICD-10-CM | POA: Diagnosis not present

## 2021-08-18 DIAGNOSIS — Z1331 Encounter for screening for depression: Secondary | ICD-10-CM | POA: Diagnosis not present

## 2021-08-18 DIAGNOSIS — Z6827 Body mass index (BMI) 27.0-27.9, adult: Secondary | ICD-10-CM | POA: Diagnosis not present

## 2021-08-18 DIAGNOSIS — E663 Overweight: Secondary | ICD-10-CM | POA: Diagnosis not present

## 2021-08-30 DIAGNOSIS — I10 Essential (primary) hypertension: Secondary | ICD-10-CM | POA: Diagnosis not present

## 2021-08-30 DIAGNOSIS — Z6828 Body mass index (BMI) 28.0-28.9, adult: Secondary | ICD-10-CM | POA: Diagnosis not present

## 2021-08-30 DIAGNOSIS — R030 Elevated blood-pressure reading, without diagnosis of hypertension: Secondary | ICD-10-CM | POA: Diagnosis not present

## 2021-08-30 DIAGNOSIS — E663 Overweight: Secondary | ICD-10-CM | POA: Diagnosis not present

## 2021-09-13 DIAGNOSIS — R030 Elevated blood-pressure reading, without diagnosis of hypertension: Secondary | ICD-10-CM | POA: Diagnosis not present

## 2021-09-13 DIAGNOSIS — Z6829 Body mass index (BMI) 29.0-29.9, adult: Secondary | ICD-10-CM | POA: Diagnosis not present

## 2021-09-13 DIAGNOSIS — I10 Essential (primary) hypertension: Secondary | ICD-10-CM | POA: Diagnosis not present

## 2021-11-08 DIAGNOSIS — I10 Essential (primary) hypertension: Secondary | ICD-10-CM | POA: Diagnosis not present

## 2021-11-08 DIAGNOSIS — E1140 Type 2 diabetes mellitus with diabetic neuropathy, unspecified: Secondary | ICD-10-CM | POA: Diagnosis not present

## 2021-11-08 DIAGNOSIS — M1991 Primary osteoarthritis, unspecified site: Secondary | ICD-10-CM | POA: Diagnosis not present

## 2021-11-08 DIAGNOSIS — E890 Postprocedural hypothyroidism: Secondary | ICD-10-CM | POA: Diagnosis not present

## 2021-11-08 DIAGNOSIS — E7849 Other hyperlipidemia: Secondary | ICD-10-CM | POA: Diagnosis not present

## 2021-11-08 DIAGNOSIS — Z6829 Body mass index (BMI) 29.0-29.9, adult: Secondary | ICD-10-CM | POA: Diagnosis not present

## 2021-11-08 DIAGNOSIS — R251 Tremor, unspecified: Secondary | ICD-10-CM | POA: Diagnosis not present

## 2021-11-08 DIAGNOSIS — E782 Mixed hyperlipidemia: Secondary | ICD-10-CM | POA: Diagnosis not present

## 2021-12-20 DIAGNOSIS — Z6829 Body mass index (BMI) 29.0-29.9, adult: Secondary | ICD-10-CM | POA: Diagnosis not present

## 2021-12-20 DIAGNOSIS — E663 Overweight: Secondary | ICD-10-CM | POA: Diagnosis not present

## 2021-12-20 DIAGNOSIS — E890 Postprocedural hypothyroidism: Secondary | ICD-10-CM | POA: Diagnosis not present

## 2022-01-30 ENCOUNTER — Emergency Department (HOSPITAL_COMMUNITY)
Admission: EM | Admit: 2022-01-30 | Discharge: 2022-01-30 | Disposition: A | Discharge status: Home / Self Care | Payer: PPO | Source: Home / Self Care | Attending: Emergency Medicine | Admitting: Emergency Medicine

## 2022-01-30 ENCOUNTER — Encounter: Payer: Self-pay | Admitting: Emergency Medicine

## 2022-01-30 ENCOUNTER — Ambulatory Visit
Admission: EM | Admit: 2022-01-30 | Discharge: 2022-01-30 | Disposition: A | Discharge status: Nursing Home (Includes ICF) | Payer: PPO | Attending: Nurse Practitioner | Admitting: Nurse Practitioner

## 2022-01-30 ENCOUNTER — Encounter (HOSPITAL_COMMUNITY): Payer: Self-pay

## 2022-01-30 ENCOUNTER — Other Ambulatory Visit: Payer: Self-pay

## 2022-01-30 DIAGNOSIS — Z7984 Long term (current) use of oral hypoglycemic drugs: Secondary | ICD-10-CM | POA: Insufficient documentation

## 2022-01-30 DIAGNOSIS — R4182 Altered mental status, unspecified: Secondary | ICD-10-CM | POA: Diagnosis not present

## 2022-01-30 DIAGNOSIS — N136 Pyonephrosis: Secondary | ICD-10-CM | POA: Diagnosis present

## 2022-01-30 DIAGNOSIS — R197 Diarrhea, unspecified: Secondary | ICD-10-CM

## 2022-01-30 DIAGNOSIS — E872 Acidosis, unspecified: Secondary | ICD-10-CM | POA: Diagnosis present

## 2022-01-30 DIAGNOSIS — R0902 Hypoxemia: Secondary | ICD-10-CM

## 2022-01-30 DIAGNOSIS — N200 Calculus of kidney: Secondary | ICD-10-CM | POA: Diagnosis not present

## 2022-01-30 DIAGNOSIS — M1990 Unspecified osteoarthritis, unspecified site: Secondary | ICD-10-CM | POA: Diagnosis present

## 2022-01-30 DIAGNOSIS — K529 Noninfective gastroenteritis and colitis, unspecified: Secondary | ICD-10-CM | POA: Diagnosis present

## 2022-01-30 DIAGNOSIS — N390 Urinary tract infection, site not specified: Secondary | ICD-10-CM | POA: Diagnosis not present

## 2022-01-30 DIAGNOSIS — Z881 Allergy status to other antibiotic agents status: Secondary | ICD-10-CM | POA: Diagnosis not present

## 2022-01-30 DIAGNOSIS — N132 Hydronephrosis with renal and ureteral calculous obstruction: Secondary | ICD-10-CM | POA: Diagnosis not present

## 2022-01-30 DIAGNOSIS — Z791 Long term (current) use of non-steroidal anti-inflammatories (NSAID): Secondary | ICD-10-CM | POA: Diagnosis not present

## 2022-01-30 DIAGNOSIS — N10 Acute tubulo-interstitial nephritis: Secondary | ICD-10-CM | POA: Diagnosis not present

## 2022-01-30 DIAGNOSIS — N201 Calculus of ureter: Secondary | ICD-10-CM | POA: Diagnosis not present

## 2022-01-30 DIAGNOSIS — S0990XA Unspecified injury of head, initial encounter: Secondary | ICD-10-CM | POA: Diagnosis not present

## 2022-01-30 DIAGNOSIS — R112 Nausea with vomiting, unspecified: Secondary | ICD-10-CM | POA: Diagnosis not present

## 2022-01-30 DIAGNOSIS — N3281 Overactive bladder: Secondary | ICD-10-CM | POA: Diagnosis present

## 2022-01-30 DIAGNOSIS — K5730 Diverticulosis of large intestine without perforation or abscess without bleeding: Secondary | ICD-10-CM | POA: Diagnosis not present

## 2022-01-30 DIAGNOSIS — Z79899 Other long term (current) drug therapy: Secondary | ICD-10-CM | POA: Diagnosis not present

## 2022-01-30 DIAGNOSIS — R42 Dizziness and giddiness: Secondary | ICD-10-CM | POA: Diagnosis not present

## 2022-01-30 DIAGNOSIS — Z79890 Hormone replacement therapy (postmenopausal): Secondary | ICD-10-CM | POA: Diagnosis not present

## 2022-01-30 DIAGNOSIS — E119 Type 2 diabetes mellitus without complications: Secondary | ICD-10-CM | POA: Diagnosis present

## 2022-01-30 DIAGNOSIS — Z20822 Contact with and (suspected) exposure to covid-19: Secondary | ICD-10-CM | POA: Diagnosis present

## 2022-01-30 DIAGNOSIS — I10 Essential (primary) hypertension: Secondary | ICD-10-CM | POA: Diagnosis not present

## 2022-01-30 DIAGNOSIS — Z87442 Personal history of urinary calculi: Secondary | ICD-10-CM | POA: Diagnosis not present

## 2022-01-30 DIAGNOSIS — R1114 Bilious vomiting: Secondary | ICD-10-CM | POA: Diagnosis not present

## 2022-01-30 DIAGNOSIS — I959 Hypotension, unspecified: Secondary | ICD-10-CM

## 2022-01-30 DIAGNOSIS — I878 Other specified disorders of veins: Secondary | ICD-10-CM | POA: Diagnosis not present

## 2022-01-30 DIAGNOSIS — N170 Acute kidney failure with tubular necrosis: Secondary | ICD-10-CM | POA: Diagnosis not present

## 2022-01-30 DIAGNOSIS — N179 Acute kidney failure, unspecified: Secondary | ICD-10-CM

## 2022-01-30 DIAGNOSIS — N281 Cyst of kidney, acquired: Secondary | ICD-10-CM | POA: Diagnosis not present

## 2022-01-30 DIAGNOSIS — K219 Gastro-esophageal reflux disease without esophagitis: Secondary | ICD-10-CM | POA: Diagnosis present

## 2022-01-30 DIAGNOSIS — E890 Postprocedural hypothyroidism: Secondary | ICD-10-CM | POA: Diagnosis present

## 2022-01-30 DIAGNOSIS — Z9851 Tubal ligation status: Secondary | ICD-10-CM | POA: Diagnosis not present

## 2022-01-30 DIAGNOSIS — E875 Hyperkalemia: Secondary | ICD-10-CM | POA: Diagnosis not present

## 2022-01-30 DIAGNOSIS — Z888 Allergy status to other drugs, medicaments and biological substances status: Secondary | ICD-10-CM | POA: Diagnosis not present

## 2022-01-30 DIAGNOSIS — E860 Dehydration: Secondary | ICD-10-CM | POA: Diagnosis present

## 2022-01-30 DIAGNOSIS — R1111 Vomiting without nausea: Secondary | ICD-10-CM | POA: Diagnosis not present

## 2022-01-30 DIAGNOSIS — N1330 Unspecified hydronephrosis: Secondary | ICD-10-CM | POA: Diagnosis not present

## 2022-01-30 DIAGNOSIS — Z90710 Acquired absence of both cervix and uterus: Secondary | ICD-10-CM | POA: Diagnosis not present

## 2022-01-30 DIAGNOSIS — A419 Sepsis, unspecified organism: Secondary | ICD-10-CM | POA: Diagnosis not present

## 2022-01-30 DIAGNOSIS — E039 Hypothyroidism, unspecified: Secondary | ICD-10-CM | POA: Insufficient documentation

## 2022-01-30 DIAGNOSIS — Z87440 Personal history of urinary (tract) infections: Secondary | ICD-10-CM | POA: Diagnosis not present

## 2022-01-30 LAB — COMPREHENSIVE METABOLIC PANEL
ALT: 24 U/L (ref 0–44)
AST: 21 U/L (ref 15–41)
Albumin: 4.4 g/dL (ref 3.5–5.0)
Alkaline Phosphatase: 63 U/L (ref 38–126)
Anion gap: 8 (ref 5–15)
BUN: 20 mg/dL (ref 8–23)
CO2: 19 mmol/L — ABNORMAL LOW (ref 22–32)
Calcium: 10.9 mg/dL — ABNORMAL HIGH (ref 8.9–10.3)
Chloride: 113 mmol/L — ABNORMAL HIGH (ref 98–111)
Creatinine, Ser: 1.56 mg/dL — ABNORMAL HIGH (ref 0.44–1.00)
GFR, Estimated: 36 mL/min — ABNORMAL LOW (ref >60)
Glucose, Bld: 147 mg/dL — ABNORMAL HIGH (ref 70–99)
Potassium: 5.4 mmol/L — ABNORMAL HIGH (ref 3.5–5.1)
Sodium: 140 mmol/L (ref 135–145)
Total Bilirubin: 0.7 mg/dL (ref 0.3–1.2)
Total Protein: 8.1 g/dL (ref 6.5–8.1)

## 2022-01-30 LAB — CBC
HCT: 43.1 % (ref 36.0–46.0)
Hemoglobin: 13.7 g/dL (ref 12.0–15.0)
MCH: 29.7 pg (ref 26.0–34.0)
MCHC: 31.8 g/dL (ref 30.0–36.0)
MCV: 93.3 fL (ref 80.0–100.0)
Platelets: 339 10*3/uL (ref 150–400)
RBC: 4.62 MIL/uL (ref 3.87–5.11)
RDW: 14.8 % (ref 11.5–15.5)
WBC: 17.4 10*3/uL — ABNORMAL HIGH (ref 4.0–10.5)
nRBC: 0 % (ref 0.0–0.2)

## 2022-01-30 LAB — LIPASE, BLOOD: Lipase: 32 U/L (ref 11–51)

## 2022-01-30 MED ORDER — SODIUM CHLORIDE 0.9 % IV BOLUS
1000.0000 mL | Freq: Once | INTRAVENOUS | Status: AC
  Administered 2022-01-30: 1000 mL via INTRAVENOUS

## 2022-01-30 MED ORDER — SODIUM CHLORIDE 0.9 % IV BOLUS
500.0000 mL | Freq: Once | INTRAVENOUS | Status: AC
  Administered 2022-01-30: 500 mL via INTRAVENOUS

## 2022-01-30 MED ORDER — ONDANSETRON HCL 4 MG/2ML IJ SOLN
4.0000 mg | Freq: Once | INTRAMUSCULAR | Status: AC
  Administered 2022-01-30: 4 mg via INTRAVENOUS

## 2022-01-30 MED ORDER — ONDANSETRON HCL 4 MG PO TABS: 4.0000 mg | ORAL_TABLET | Freq: Four times a day (QID) | ORAL | 0 refills | Status: DC

## 2022-01-30 MED ORDER — ONDANSETRON 4 MG PO TBDP: 4.0000 mg | ORAL_TABLET | Freq: Once | ORAL | Status: DC

## 2022-01-30 MED ORDER — CIPROFLOXACIN HCL 500 MG PO TABS: 500.0000 mg | ORAL_TABLET | Freq: Two times a day (BID) | ORAL | 0 refills | Status: DC

## 2022-01-30 MED ORDER — ONDANSETRON HCL 4 MG/2ML IJ SOLN: 4.0000 mg | Freq: Once | INTRAMUSCULAR | Status: DC | PRN

## 2022-01-30 NOTE — ED Notes (Signed)
Report called to [facility] at [facility] ED.

## 2022-01-30 NOTE — ED Notes (Addendum)
During IV start, pt noted to become increasingly diaphoretic, pale,hypoxic, nausea increased. Ice pack placed behind neck. BP re-obtained and noted to 75/50. Pt laid back, feet elevated. O2 saturation on room air prior to oxygen placement noted to range from 86-90%. NP aware.Pt 02 91% on 2 liters.   Pt daughter aware of care plan and at bedside.

## 2022-01-30 NOTE — ED Notes (Signed)
C-com notified and en route.

## 2022-01-30 NOTE — ED Triage Notes (Signed)
Pt arrived [facility] from [facility] Care in [place]. EMS reports pt had diarrhea x 1 week and vomiting today. Pt b/p at Urgent care was 66 's and staff put pt in trendelenburg. Urgent care started 22 G in left wrist and 500 of ns started.

## 2022-01-30 NOTE — ED Provider Notes (Addendum)
[facility]    CSN: [id number] Arrival date & time: 01/30/22  0919      History   Chief Complaint Chief Complaint  Patient presents with   Emesis    HPI Patricia [name] is a 71 y.o. female.   Patient presents with 1 day of nonbloody diarrhea.  Reports she started vomiting bilious fluid this morning.  She denies abdominal pain.  Reports she has has been having sweats, thinks she may have a fever.  Also endorses dizziness/lightheadedness.  Reports she has taken her blood pressure medicine already today.  She has not been able to eat or drink anything today.  Reports this occurred about a week ago, self resolved with Imodium.    Past Medical History:  Diagnosis Date   Arthritis    Diabetes mellitus without complication (HCC)    Hypertension    Hypothyroidism    PONV (postoperative nausea and vomiting)     Patient Active Problem List   Diagnosis Date Noted   Gastroesophageal reflux disease without esophagitis 08/16/2021   Positive occult stool blood test 08/16/2021   Calculus of kidney 05/02/2019   Pain in thoracic spine 12/26/2013   Lumbago 12/26/2013   Stiffness of joint, not elsewhere classified, pelvic region and thigh 12/26/2013   Abnormality of gait 12/26/2013    Past Surgical History:  Procedure Laterality Date   ABDOMINAL HYSTERECTOMY     CHOLECYSTECTOMY N/A 03/02/2015   Procedure: LAPAROSCOPIC CHOLECYSTECTOMY;  Surgeon: Franky Macho, MD;  Location: [facility];  Service: General;  Laterality: N/A;   HOLMIUM LASER APPLICATION Right 05/02/2019   Procedure: HOLMIUM LASER APPLICATION;  Surgeon: Marcine Matar, MD;  Location: [facility];  Service: Urology;  Laterality: Right;   IR URETERAL STENT RIGHT NEW ACCESS W/O SEP NEPHROSTOMY CATH  05/02/2019   KNEE ARTHROSCOPY Right    LIVER BIOPSY  03/02/2015   Procedure: LAPAROSCOPIC LIVER BIOPSY;  Surgeon: Franky Macho, MD;  Location: [facility];  Service: General;;   NEPHROLITHOTOMY Right 05/02/2019   Procedure:  NEPHROLITHOTOMY PERCUTANEOUS;  Surgeon: Marcine Matar, MD;  Location: [facility];  Service: Urology;  Laterality: Right;  3 HRS   THYROIDECTOMY     TONSILLECTOMY  1970   TUBAL LIGATION      OB History   No obstetric history on file.      Home Medications    Prior to Admission medications   Medication Sig Start Date End Date Taking? Authorizing Provider  APPLE CIDER VINEGAR PO Take 1 tablet by mouth daily.   Yes [provider]  cetirizine (ZYRTEC) 10 MG tablet Take 10 mg by mouth daily.   Yes [provider]  CINNAMON PO Take 1,000 mg by mouth daily.   Yes [provider]  cyclobenzaprine (FLEXERIL) 10 MG tablet Take 10 mg by mouth 3 (three) times daily as needed for muscle spasms.   Yes [provider]  escitalopram (LEXAPRO) 5 MG tablet Take 5 mg by mouth daily. 07/17/19  Yes [provider]  levothyroxine (SYNTHROID) 150 MCG tablet Take 150 mcg by mouth daily. 07/05/19  Yes [provider]  meclizine (ANTIVERT) 25 MG tablet Take 25 mg by mouth daily as needed for nausea. 11/12/20  Yes [provider]  meloxicam (MOBIC) 15 MG tablet Take 15 mg by mouth as needed for pain.   Yes [provider]  metFORMIN (GLUCOPHAGE) 1000 MG tablet Take 1,000 mg by mouth 2 (two) times daily. 11/25/19  Yes [provider]  olmesartan (BENICAR) 40 MG  tablet Take 40 mg by mouth daily. 04/05/19  Yes [provider]  Omega-3 Fatty Acids (FISH OIL) 1000 MG CAPS Take 1 capsule by mouth daily.   Yes [provider]  ondansetron (ZOFRAN) 4 MG tablet Take 1 tablet (4 mg total) by mouth every 6 (six) hours. 11/12/20  Yes [place], [place], PA-C  simvastatin (ZOCOR) 20 MG tablet Take 20 mg by mouth daily.   Yes [provider]  solifenacin (VESICARE) 10 MG tablet Take 1 tablet (10 mg total) by mouth daily. 02/02/21  Yes Marcine Matar, MD    Family History No family history on file.  Social History Social  History   Tobacco Use   Smoking status: Never   Smokeless tobacco: Never  Vaping Use   Vaping Use: Never used  Substance Use Topics   Alcohol use: No   Drug use: No     Allergies   Amlodipine and Keflex [cephalexin]   Review of Systems Review of Systems Per HPI  Physical Exam Triage Vital Signs ED Triage Vitals  Enc Vitals Group     BP 01/30/22 0932 96/60     Pulse Rate 01/30/22 0932 66     Resp 01/30/22 0932 18     Temp 01/30/22 0932 98.2 F (36.8 C)     Temp Source 01/30/22 0932 Oral     SpO2 01/30/22 0932 92 %     Weight --      Height --      Head Circumference --      Peak Flow --      Pain Score 01/30/22 0933 0     Pain Loc --      Pain Edu? --      Excl. in GC? --    No data found.  Updated Vital Signs BP 106/68 (BP Location: Right Arm)   Pulse 61   Temp 98.2 F (36.8 C) (Oral)   Resp 20   SpO2 94%   Visual Acuity Right Eye Distance:   Left Eye Distance:   Bilateral Distance:    Right Eye Near:   Left Eye Near:    Bilateral Near:     Physical Exam Vitals and nursing note reviewed.  Constitutional:      Appearance: Normal appearance. She is ill-appearing and diaphoretic.  HENT:     Mouth/Throat:     Mouth: Mucous membranes are moist.     Pharynx: Oropharynx is clear.  Eyes:     General: No scleral icterus.    Extraocular Movements: Extraocular movements intact.  Cardiovascular:     Rate and Rhythm: Normal rate and regular rhythm.  Pulmonary:     Effort: Pulmonary effort is normal. No respiratory distress.     Breath sounds: Normal breath sounds. No wheezing, rhonchi or rales.  Abdominal:     General: Abdomen is flat. Bowel sounds are increased. There is no distension.     Palpations: Abdomen is soft.     Tenderness: There is no abdominal tenderness. There is no right CVA tenderness, left CVA tenderness or guarding.  Skin:    Coloration: Skin is pale.  Neurological:     Mental Status: She is alert and oriented to person, place,  and time.  Psychiatric:        Behavior: Behavior is cooperative.      UC Treatments / Results  Labs (all labs ordered are listed, but only abnormal results are displayed) Labs Reviewed - No data to display  EKG  Radiology No results found.  Procedures Procedures (including critical care time)  Medications Ordered in UC Medications  sodium chloride 0.9 % bolus 500 mL (0 mLs Intravenous Stopped 01/30/22 1045)  ondansetron (ZOFRAN) injection 4 mg (4 mg Intravenous Given 01/30/22 1008)    Initial Impression / Assessment and Plan / UC Course  I have reviewed the triage vital signs and the nursing notes.  Pertinent labs & imaging results that were available during my care of the patient were reviewed by me and considered in my medical decision making (see chart for details).    Patient is a very pleasant, ill-appearing 71 year old female presenting for nausea with bilious vomiting, diarrhea for the past roughly 24 hours.  In urgent care, we attempted to start an IV and give her a 500 mL bolus of normal saline.  During the IV attempt, she became diaphoretic, pale, and broke out in a sweat and became dizzy.  Blood pressure dropped to 80/50 and patient became hypoxic to the mid 80s on room air..  Patient placed in Trendelenburg, started 2 L of oxygen via nasal cannula, and ice pack to back of neck.  She remained alert the entire time.  Discussed with patient that given severity of symptoms, recommend evaluation in the emergency room for further during, work-up, and treatment.  Patient is in agreement to plan.  Daughter notified.  Patient transported by EMS to emergency room.  Patient left urgent care in stable condition. Final Clinical Impressions(s) / UC Diagnoses   Final diagnoses:  Hypotension, unspecified hypotension type  Bilious vomiting with nausea  Diarrhea, unspecified type  Hypoxemia   Discharge Instructions   None    ED Prescriptions   None    PDMP not reviewed  this encounter.   Valentino Nose, NP 01/30/22 1045    Valentino Nose, NP 01/30/22 1047

## 2022-01-30 NOTE — Discharge Instructions (Signed)
We evaluated you in the emergency department for your diarrhea.  Your lab tests were significant for mild elevation in your white blood cell count and a small kidney injury.  Please follow closely with your nephrologist.  Return to the emergency department should you develop any abdominal pain, vomiting, blood in your stool, inability to keep up with diarrhea with oral liquids, or any other concerning symptoms.

## 2022-01-30 NOTE — ED Triage Notes (Signed)
Patient states that she's had vomiting and diarrhea x 1 day.  Denies fever.  Patient has been taken Imodium.

## 2022-01-30 NOTE — ED Notes (Signed)
Water given to pt to drink.  

## 2022-01-30 NOTE — ED Notes (Signed)
Patient is being discharged from the Urgent Care and sent to the Emergency Department via EMS . Per NP, patient is in need of higher level of care due to dehydration/abnormal vitals signs. Patient is aware and verbalizes understanding of plan of care.  Vitals:   01/30/22 0932  BP: 96/60  Pulse: 66  Resp: 18  Temp: 98.2 F (36.8 C)  SpO2: 92%

## 2022-01-31 NOTE — ED Provider Notes (Signed)
[facility] EMERGENCY DEPARTMENT Provider Note  CSN: [id number] Arrival date & time: 01/30/22 1054  Chief Complaint(s) Emesis and Diarrhea  HPI Patricia [name] is a 71 y.o. female 71 year old female with a history of diabetes, hypertension presenting with nausea, vomiting, diarrhea.  Patricia [name] reports Patricia [name] initially started with diarrhea for the past few days, had an episode of vomiting this morning.  No hematemesis, hematochezia.  Has had nausea as well.  Denies fevers or chills.  Denies any abdominal pain.  Denies dysuria, denies chest pain, shortness of breath.  Patient was initially seen today in urgent care, reportedly had a near syncopal event during IV attempt, given fluids and oxygen and transferred to emergency department.  Patient reports Patricia [name] feels much better than earlier.   Past Medical History Past Medical History:  Diagnosis Date   Arthritis    Diabetes mellitus without complication (HCC)    Hypertension    Hypothyroidism    PONV (postoperative nausea and vomiting)    Patient Active Problem List   Diagnosis Date Noted   Gastroesophageal reflux disease without esophagitis 08/16/2021   Positive occult stool blood test 08/16/2021   Calculus of kidney 05/02/2019   Pain in thoracic spine 12/26/2013   Lumbago 12/26/2013   Stiffness of joint, not elsewhere classified, pelvic region and thigh 12/26/2013   Abnormality of gait 12/26/2013   Home Medication(s) Prior to Admission medications   Medication Sig Start Date End Date Taking? Authorizing Provider  ciprofloxacin (CIPRO) 500 MG tablet Take 1 tablet (500 mg total) by mouth every 12 (twelve) hours. 01/30/22  Yes Lonell Grandchild, MD  ondansetron (ZOFRAN) 4 MG tablet Take 1 tablet (4 mg total) by mouth every 6 (six) hours. 01/30/22  Yes Lonell Grandchild, MD  APPLE CIDER VINEGAR PO Take 1 tablet by mouth daily.    [provider]  cetirizine (ZYRTEC) 10 MG tablet Take 10 mg by mouth daily.    [provider]   CINNAMON PO Take 1,000 mg by mouth daily.    [provider]  cyclobenzaprine (FLEXERIL) 10 MG tablet Take 10 mg by mouth 3 (three) times daily as needed for muscle spasms.    [provider]  escitalopram (LEXAPRO) 5 MG tablet Take 5 mg by mouth daily. 07/17/19   [provider]  levothyroxine (SYNTHROID) 150 MCG tablet Take 150 mcg by mouth daily. 07/05/19   [provider]  meclizine (ANTIVERT) 25 MG tablet Take 25 mg by mouth daily as needed for nausea. 11/12/20   [provider]  meloxicam (MOBIC) 15 MG tablet Take 15 mg by mouth as needed for pain.    [provider]  metFORMIN (GLUCOPHAGE) 1000 MG tablet Take 1,000 mg by mouth 2 (two) times daily. 11/25/19   [provider]  olmesartan (BENICAR) 40 MG tablet Take 40 mg by mouth daily. 04/05/19   [provider]  Omega-3 Fatty Acids (FISH OIL) 1000 MG CAPS Take 1 capsule by mouth daily.    [provider]  simvastatin (ZOCOR) 20 MG tablet Take 20 mg by mouth daily.    [provider]  solifenacin (VESICARE) 10 MG tablet Take 1 tablet (10 mg total) by mouth daily. 02/02/21   Marcine Matar, MD  Past Surgical History Past Surgical History:  Procedure Laterality Date   ABDOMINAL HYSTERECTOMY     CHOLECYSTECTOMY N/A 03/02/2015   Procedure: LAPAROSCOPIC CHOLECYSTECTOMY;  Surgeon: Franky Macho, MD;  Location: [facility];  Service: General;  Laterality: N/A;   HOLMIUM LASER APPLICATION Right 05/02/2019   Procedure: HOLMIUM LASER APPLICATION;  Surgeon: Marcine Matar, MD;  Location: [facility];  Service: Urology;  Laterality: Right;   IR URETERAL STENT RIGHT NEW ACCESS W/O SEP NEPHROSTOMY CATH  05/02/2019   KNEE ARTHROSCOPY Right    LIVER BIOPSY  03/02/2015   Procedure: LAPAROSCOPIC LIVER BIOPSY;  Surgeon: Franky Macho, MD;  Location:  [facility];  Service: General;;   NEPHROLITHOTOMY Right 05/02/2019   Procedure: NEPHROLITHOTOMY PERCUTANEOUS;  Surgeon: Marcine Matar, MD;  Location: [facility];  Service: Urology;  Laterality: Right;  3 HRS   THYROIDECTOMY     TONSILLECTOMY  1970   TUBAL LIGATION     Family History No family history on file.  Social History Social History   Tobacco Use   Smoking status: Never   Smokeless tobacco: Never  Vaping Use   Vaping Use: Never used  Substance Use Topics   Alcohol use: No   Drug use: No   Allergies Amlodipine and Keflex [cephalexin]  Review of Systems Review of Systems  All other systems reviewed and are negative.   Physical Exam Vital Signs  I have reviewed the triage vital signs BP 117/65 (BP Location: Right Arm)   Pulse 63   Temp 97.7 F (36.5 C) (Oral)   Resp 15   Ht 5\' 4"  (1.626 m)   Wt 83.5 kg   SpO2 94%   BMI 31.58 kg/m  Physical Exam Vitals and nursing note reviewed.  Constitutional:      General: Patricia [name] is not in acute distress.    Appearance: Patricia [name] is well-developed.  HENT:     Head: Normocephalic and atraumatic.     Mouth/Throat:     Mouth: Mucous membranes are moist.  Eyes:     Pupils: Pupils are equal, round, and reactive to light.  Cardiovascular:     Rate and Rhythm: Normal rate and regular rhythm.     Heart sounds: No murmur heard. Pulmonary:     Effort: Pulmonary effort is normal. No respiratory distress.     Breath sounds: Normal breath sounds.  Abdominal:     General: Abdomen is flat.     Palpations: Abdomen is soft.     Tenderness: There is no abdominal tenderness.  Musculoskeletal:        General: No tenderness.     Right lower leg: No edema.     Left lower leg: No edema.  Skin:    General: Skin is warm and dry.  Neurological:     General: No focal deficit present.     Mental Status: Patricia [name] is alert. Mental status is at baseline.  Psychiatric:        Mood and Affect: Mood normal.        Behavior: Behavior normal.     ED  Results and Treatments Labs (all labs ordered are listed, but only abnormal results are displayed) Labs Reviewed  CBC - Abnormal; Notable for the following components:      Result Value   WBC 17.4 (*)    All other components within normal limits  COMPREHENSIVE METABOLIC PANEL - Abnormal; Notable for the following components:   Potassium 5.4 (*)    Chloride 113 (*)    CO2 19 (*)  Glucose, Bld 147 (*)    Creatinine, Ser 1.56 (*)    Calcium 10.9 (*)    GFR, Estimated 36 (*)    All other components within normal limits  LIPASE, BLOOD                                                                                                                          Radiology No results found.  Pertinent labs & imaging results that were available during my care of the patient were reviewed by me and considered in my medical decision making (see MDM for details).  Medications Ordered in ED Medications  sodium chloride 0.9 % bolus 1,000 mL (0 mLs Intravenous Stopped 01/30/22 1353)                                                                                                                                     Procedures .1-3 Lead EKG Interpretation  Performed by: Lonell Grandchild, MD Authorized by: Lonell Grandchild, MD     Interpretation: normal     ECG rate assessment: normal     Rhythm: sinus rhythm     Ectopy: none     Conduction: normal     (including critical care time)  Medical Decision Making / ED Course   MDM:  71 year old female presenting to the emergency department with nausea, vomiting, diarrhea.  Patient well-appearing, 1 episode of borderline low blood pressure in the emergency department but otherwise normotensive.  Not tachycardic.  No fevers.  Abdominal exam entirely soft and nondistended without any tenderness even with deep palpation.  Suspect most likely cause is gastroenteritis given nausea, vomiting, diarrhea.  No blood in stool to suggest invasive  diarrhea.  No focal abdominal tenderness to suggest colitis, diverticulitis, appendicitis, cholecystitis, pancreatitis, or other acute focal intra-abdominal pathology.  Lab testing notable for elevated WBC count, likely in setting of infection and vomiting, as well as AKI likely in the setting of dehydration.  Patient was given IV fluids, Zofran, was able to tolerate p.o. in the emergency department.  Given elevated WBC count, elevated creatinine, discussed possibility of staying in the hospital versus discharge to home with antiemetics and strict return precaution instructions to maintain oral rehydration.  Patient has follow-up with her nephrologist in 2 days and can have kidney function re-checked, and prefers to go home, and given her well appearance, able to tolerate p.o.,  no fever, no abdominal tenderness, I feel this is reasonable and will discharge the patient with close follow-up as an outpatient.  Suspect patient's episode during urgent care visit was a vasovagal episode in the setting of blood draw.  Patient denies other symptoms such as chest pain, palpitations, shortness of breath.  Denies other episodes in the past. ECG with sinus rhythm. Advised PMD f/u.      Additional history obtained: -Additional history obtained from daughter -External records from outside source obtained and reviewed including: Chart review including previous notes, labs, imaging, consultation notes   Lab Tests: -I ordered, reviewed, and interpreted labs.   The pertinent results include:   Labs Reviewed  CBC - Abnormal; Notable for the following components:      Result Value   WBC 17.4 (*)    All other components within normal limits  COMPREHENSIVE METABOLIC PANEL - Abnormal; Notable for the following components:   Potassium 5.4 (*)    Chloride 113 (*)    CO2 19 (*)    Glucose, Bld 147 (*)    Creatinine, Ser 1.56 (*)    Calcium 10.9 (*)    GFR, Estimated 36 (*)    All other components within normal  limits  LIPASE, BLOOD      EKG   EKG Interpretation  Date/Time:  Sunday January 30 2022 11:03:19 EDT Ventricular Rate:  62 PR Interval:  170 QRS Duration: 94 QT Interval:  576 QTC Calculation: 586 R Axis:   -21 Text Interpretation: Sinus rhythm Inferior infarct, old Consider anterior infarct Prolonged QT interval Confirmed by Alvino Blood ([id number]) on 01/31/2022 10:11:00 AM          Medicines ordered and prescription drug management: Meds ordered this encounter  Medications   DISCONTD: ondansetron (ZOFRAN) injection 4 mg   sodium chloride 0.9 % bolus 1,000 mL   ciprofloxacin (CIPRO) 500 MG tablet    Sig: Take 1 tablet (500 mg total) by mouth every 12 (twelve) hours.    Dispense:  10 tablet    Refill:  0   ondansetron (ZOFRAN) 4 MG tablet    Sig: Take 1 tablet (4 mg total) by mouth every 6 (six) hours.    Dispense:  12 tablet    Refill:  0    -I have reviewed the patients home medicines and have made adjustments as needed     Cardiac Monitoring: The patient was maintained on a cardiac monitor.  I personally viewed and interpreted the cardiac monitored which showed an underlying rhythm of: NSR   Reevaluation: After the interventions noted above, I reevaluated the patient and found that they have :improved  Co morbidities that complicate the patient evaluation  Past Medical History:  Diagnosis Date   Arthritis    Diabetes mellitus without complication (HCC)    Hypertension    Hypothyroidism    PONV (postoperative nausea and vomiting)       Dispostion: Discharge     Final Clinical Impression(s) / ED Diagnoses Final diagnoses:  Gastroenteritis  AKI (acute kidney injury) (HCC)     This chart was dictated using voice recognition software.  Despite best efforts to proofread,  errors can occur which can change the documentation meaning.    Lonell Grandchild, MD 01/31/22 1011

## 2022-01-31 NOTE — Progress Notes (Signed)
   History of Present Illness: Patricia [name] is a 71 y.o. year old female   She has a history of right ureteral and renal calculi, treated with percutaneous nephrolithotomy.  That was in 2020.  No evidence of symptoms since that time but she has not had follow-up KUB in over 2 years.  Overactive bladder.  She is on Solifenacin.  She has limited mobility.  She goes 2-3 pads a day.  She has had no recent urinary tract infections or hematuria.  Past Medical History:  Diagnosis Date   Arthritis    Diabetes mellitus without complication (HCC)    Hypertension    Hypothyroidism    PONV (postoperative nausea and vomiting)     Past Surgical History:  Procedure Laterality Date   ABDOMINAL HYSTERECTOMY     CHOLECYSTECTOMY N/A 03/02/2015   Procedure: LAPAROSCOPIC CHOLECYSTECTOMY;  Surgeon: Franky Macho, MD;  Location: [facility];  Service: General;  Laterality: N/A;   HOLMIUM LASER APPLICATION Right 05/02/2019   Procedure: HOLMIUM LASER APPLICATION;  Surgeon: Marcine Matar, MD;  Location: [facility];  Service: Urology;  Laterality: Right;   IR URETERAL STENT RIGHT NEW ACCESS W/O SEP NEPHROSTOMY CATH  05/02/2019   KNEE ARTHROSCOPY Right    LIVER BIOPSY  03/02/2015   Procedure: LAPAROSCOPIC LIVER BIOPSY;  Surgeon: Franky Macho, MD;  Location: [facility];  Service: General;;   NEPHROLITHOTOMY Right 05/02/2019   Procedure: NEPHROLITHOTOMY PERCUTANEOUS;  Surgeon: Marcine Matar, MD;  Location: [facility];  Service: Urology;  Laterality: Right;  3 HRS   THYROIDECTOMY     TONSILLECTOMY  1970   TUBAL LIGATION      Home Medications:  (Not in a hospital admission)   Allergies:  Allergies  Allergen Reactions   Amlodipine     Swelling   Keflex [Cephalexin] Rash    No family history on file.  Social History:  reports that she has never smoked. She has never used smokeless tobacco. She reports that she does not drink alcohol and does not use drugs.  ROS: A complete review of systems was  performed.  All systems are negative except for pertinent findings as noted.  Physical Exam:  Vital signs in last 24 hours: @VSRANGES @ General:  Alert and oriented, No acute distress.  She is in a wheelchair HEENT: Normocephalic, atraumatic Neck: No JVD or lymphadenopathy Cardiovascular: Regular rate  Extremities: No edema Neurologic: Grossly intact  I have reviewed prior pt notes  I have reviewed urinalysis results  I have independently reviewed prior imaging-prior KUB and CT images reviewed with the patient and her sister.  Today's KUB revealed no evidence of calculus disease - Impression/Assessment:  1.  Overactive bladder, stable.  She does have limited mobility.  She is on Vesicare  2.  History of urolithiasis with no evident calculus disease seen today  Plan:  I will have her come back in a couple of years for recheck.  She was given a refill of her Solifenacin.  Zaydah Nawabi 01/31/2022, 11:14 AM  02/02/2022. Yosmar Ryker MD

## 2022-02-01 ENCOUNTER — Ambulatory Visit: Payer: PPO | Admitting: Urology

## 2022-02-01 ENCOUNTER — Emergency Department (HOSPITAL_COMMUNITY): Payer: PPO

## 2022-02-01 ENCOUNTER — Ambulatory Visit (HOSPITAL_COMMUNITY)
Admission: RE | Admit: 2022-02-01 | Discharge: 2022-02-01 | Disposition: A | Discharge status: Home / Self Care | Payer: PPO | Source: Ambulatory Visit | Attending: Urology | Admitting: Urology

## 2022-02-01 ENCOUNTER — Encounter: Payer: Self-pay | Admitting: Urology

## 2022-02-01 ENCOUNTER — Other Ambulatory Visit: Payer: Self-pay

## 2022-02-01 ENCOUNTER — Inpatient Hospital Stay (HOSPITAL_COMMUNITY)
Admission: EM | Admit: 2022-02-01 | Discharge: 2022-02-05 | DRG: 854 | Disposition: A | Discharge status: Home Health | Payer: PPO | Source: Ambulatory Visit | Attending: Family Medicine | Admitting: Family Medicine

## 2022-02-01 ENCOUNTER — Encounter (HOSPITAL_COMMUNITY): Payer: Self-pay

## 2022-02-01 VITALS — BP 95/59 | HR 59 | Ht 64.0 in | Wt 185.0 lb

## 2022-02-01 DIAGNOSIS — Z888 Allergy status to other drugs, medicaments and biological substances status: Secondary | ICD-10-CM | POA: Diagnosis not present

## 2022-02-01 DIAGNOSIS — Z20822 Contact with and (suspected) exposure to covid-19: Secondary | ICD-10-CM | POA: Diagnosis present

## 2022-02-01 DIAGNOSIS — Z7984 Long term (current) use of oral hypoglycemic drugs: Secondary | ICD-10-CM | POA: Diagnosis not present

## 2022-02-01 DIAGNOSIS — N10 Acute tubulo-interstitial nephritis: Secondary | ICD-10-CM | POA: Diagnosis not present

## 2022-02-01 DIAGNOSIS — R4182 Altered mental status, unspecified: Secondary | ICD-10-CM

## 2022-02-01 DIAGNOSIS — K5730 Diverticulosis of large intestine without perforation or abscess without bleeding: Secondary | ICD-10-CM | POA: Diagnosis not present

## 2022-02-01 DIAGNOSIS — N281 Cyst of kidney, acquired: Secondary | ICD-10-CM | POA: Diagnosis not present

## 2022-02-01 DIAGNOSIS — A419 Sepsis, unspecified organism: Principal | ICD-10-CM | POA: Diagnosis present

## 2022-02-01 DIAGNOSIS — N179 Acute kidney failure, unspecified: Secondary | ICD-10-CM | POA: Diagnosis present

## 2022-02-01 DIAGNOSIS — Z87440 Personal history of urinary (tract) infections: Secondary | ICD-10-CM

## 2022-02-01 DIAGNOSIS — R42 Dizziness and giddiness: Secondary | ICD-10-CM | POA: Diagnosis not present

## 2022-02-01 DIAGNOSIS — E860 Dehydration: Secondary | ICD-10-CM | POA: Diagnosis present

## 2022-02-01 DIAGNOSIS — K529 Noninfective gastroenteritis and colitis, unspecified: Secondary | ICD-10-CM | POA: Diagnosis present

## 2022-02-01 DIAGNOSIS — I959 Hypotension, unspecified: Secondary | ICD-10-CM | POA: Diagnosis present

## 2022-02-01 DIAGNOSIS — I10 Essential (primary) hypertension: Secondary | ICD-10-CM | POA: Diagnosis present

## 2022-02-01 DIAGNOSIS — N3281 Overactive bladder: Secondary | ICD-10-CM | POA: Diagnosis present

## 2022-02-01 DIAGNOSIS — Z90710 Acquired absence of both cervix and uterus: Secondary | ICD-10-CM | POA: Diagnosis not present

## 2022-02-01 DIAGNOSIS — E890 Postprocedural hypothyroidism: Secondary | ICD-10-CM | POA: Diagnosis present

## 2022-02-01 DIAGNOSIS — Z9851 Tubal ligation status: Secondary | ICD-10-CM | POA: Diagnosis not present

## 2022-02-01 DIAGNOSIS — N136 Pyonephrosis: Secondary | ICD-10-CM | POA: Diagnosis present

## 2022-02-01 DIAGNOSIS — N390 Urinary tract infection, site not specified: Secondary | ICD-10-CM | POA: Diagnosis not present

## 2022-02-01 DIAGNOSIS — E872 Acidosis, unspecified: Secondary | ICD-10-CM | POA: Diagnosis present

## 2022-02-01 DIAGNOSIS — M1990 Unspecified osteoarthritis, unspecified site: Secondary | ICD-10-CM | POA: Diagnosis present

## 2022-02-01 DIAGNOSIS — Z79899 Other long term (current) drug therapy: Secondary | ICD-10-CM | POA: Diagnosis not present

## 2022-02-01 DIAGNOSIS — E039 Hypothyroidism, unspecified: Secondary | ICD-10-CM

## 2022-02-01 DIAGNOSIS — Z881 Allergy status to other antibiotic agents status: Secondary | ICD-10-CM

## 2022-02-01 DIAGNOSIS — E119 Type 2 diabetes mellitus without complications: Secondary | ICD-10-CM | POA: Diagnosis present

## 2022-02-01 DIAGNOSIS — E875 Hyperkalemia: Secondary | ICD-10-CM | POA: Diagnosis present

## 2022-02-01 DIAGNOSIS — K219 Gastro-esophageal reflux disease without esophagitis: Secondary | ICD-10-CM | POA: Diagnosis present

## 2022-02-01 DIAGNOSIS — N200 Calculus of kidney: Secondary | ICD-10-CM

## 2022-02-01 DIAGNOSIS — R112 Nausea with vomiting, unspecified: Secondary | ICD-10-CM | POA: Diagnosis not present

## 2022-02-01 DIAGNOSIS — N201 Calculus of ureter: Secondary | ICD-10-CM | POA: Diagnosis not present

## 2022-02-01 DIAGNOSIS — S0990XA Unspecified injury of head, initial encounter: Secondary | ICD-10-CM | POA: Diagnosis not present

## 2022-02-01 DIAGNOSIS — N1330 Unspecified hydronephrosis: Secondary | ICD-10-CM | POA: Diagnosis not present

## 2022-02-01 DIAGNOSIS — Z87442 Personal history of urinary calculi: Secondary | ICD-10-CM | POA: Diagnosis not present

## 2022-02-01 DIAGNOSIS — N132 Hydronephrosis with renal and ureteral calculous obstruction: Secondary | ICD-10-CM | POA: Diagnosis not present

## 2022-02-01 DIAGNOSIS — Z79890 Hormone replacement therapy (postmenopausal): Secondary | ICD-10-CM

## 2022-02-01 DIAGNOSIS — N170 Acute kidney failure with tubular necrosis: Secondary | ICD-10-CM | POA: Diagnosis not present

## 2022-02-01 DIAGNOSIS — I878 Other specified disorders of veins: Secondary | ICD-10-CM | POA: Diagnosis not present

## 2022-02-01 DIAGNOSIS — Z791 Long term (current) use of non-steroidal anti-inflammatories (NSAID): Secondary | ICD-10-CM | POA: Diagnosis not present

## 2022-02-01 LAB — TROPONIN I (HIGH SENSITIVITY)
Troponin I (High Sensitivity): 3 ng/L (ref <18)
Troponin I (High Sensitivity): 3 ng/L (ref <18)

## 2022-02-01 LAB — URINALYSIS, ROUTINE W REFLEX MICROSCOPIC
Bilirubin Urine: NEGATIVE
Glucose, UA: NEGATIVE mg/dL
Hgb urine dipstick: NEGATIVE
Ketones, ur: NEGATIVE mg/dL
Nitrite: NEGATIVE
Protein, ur: 100 mg/dL — AB
Specific Gravity, Urine: 1.013 (ref 1.005–1.030)
pH: 5 (ref 5.0–8.0)

## 2022-02-01 LAB — COMPREHENSIVE METABOLIC PANEL
ALT: 28 U/L (ref 0–44)
AST: 25 U/L (ref 15–41)
Albumin: 4 g/dL (ref 3.5–5.0)
Alkaline Phosphatase: 64 U/L (ref 38–126)
Anion gap: 10 (ref 5–15)
BUN: 49 mg/dL — ABNORMAL HIGH (ref 8–23)
CO2: 11 mmol/L — ABNORMAL LOW (ref 22–32)
Calcium: 9.8 mg/dL (ref 8.9–10.3)
Chloride: 116 mmol/L — ABNORMAL HIGH (ref 98–111)
Creatinine, Ser: 5.33 mg/dL — ABNORMAL HIGH (ref 0.44–1.00)
GFR, Estimated: 8 mL/min — ABNORMAL LOW (ref >60)
Glucose, Bld: 134 mg/dL — ABNORMAL HIGH (ref 70–99)
Potassium: 6 mmol/L — ABNORMAL HIGH (ref 3.5–5.1)
Sodium: 137 mmol/L (ref 135–145)
Total Bilirubin: 0.3 mg/dL (ref 0.3–1.2)
Total Protein: 7.3 g/dL (ref 6.5–8.1)

## 2022-02-01 LAB — LACTIC ACID, PLASMA: Lactic Acid, Venous: 1.4 mmol/L (ref 0.5–1.9)

## 2022-02-01 LAB — BASIC METABOLIC PANEL
Anion gap: 7 (ref 5–15)
BUN: 50 mg/dL — ABNORMAL HIGH (ref 8–23)
CO2: 12 mmol/L — ABNORMAL LOW (ref 22–32)
Calcium: 8.7 mg/dL — ABNORMAL LOW (ref 8.9–10.3)
Chloride: 117 mmol/L — ABNORMAL HIGH (ref 98–111)
Creatinine, Ser: 4.84 mg/dL — ABNORMAL HIGH (ref 0.44–1.00)
GFR, Estimated: 9 mL/min — ABNORMAL LOW (ref >60)
Glucose, Bld: 123 mg/dL — ABNORMAL HIGH (ref 70–99)
Potassium: 5.4 mmol/L — ABNORMAL HIGH (ref 3.5–5.1)
Sodium: 136 mmol/L (ref 135–145)

## 2022-02-01 LAB — CBC WITH DIFFERENTIAL/PLATELET
Abs Immature Granulocytes: 0.09 10*3/uL — ABNORMAL HIGH (ref 0.00–0.07)
Basophils Absolute: 0.1 10*3/uL (ref 0.0–0.1)
Basophils Relative: 1 %
Eosinophils Absolute: 0.1 10*3/uL (ref 0.0–0.5)
Eosinophils Relative: 1 %
HCT: 40 % (ref 36.0–46.0)
Hemoglobin: 12.8 g/dL (ref 12.0–15.0)
Immature Granulocytes: 1 %
Lymphocytes Relative: 17 %
Lymphs Abs: 2.2 10*3/uL (ref 0.7–4.0)
MCH: 29.8 pg (ref 26.0–34.0)
MCHC: 32 g/dL (ref 30.0–36.0)
MCV: 93 fL (ref 80.0–100.0)
Monocytes Absolute: 1.1 10*3/uL — ABNORMAL HIGH (ref 0.1–1.0)
Monocytes Relative: 9 %
Neutro Abs: 9.5 10*3/uL — ABNORMAL HIGH (ref 1.7–7.7)
Neutrophils Relative %: 71 %
Platelets: 367 10*3/uL (ref 150–400)
RBC: 4.3 MIL/uL (ref 3.87–5.11)
RDW: 15.7 % — ABNORMAL HIGH (ref 11.5–15.5)
WBC: 13.1 10*3/uL — ABNORMAL HIGH (ref 4.0–10.5)
nRBC: 0 % (ref 0.0–0.2)

## 2022-02-01 LAB — SARS CORONAVIRUS 2 BY RT PCR: SARS Coronavirus 2 by RT PCR: NEGATIVE

## 2022-02-01 LAB — CBG MONITORING, ED: Glucose-Capillary: 118 mg/dL — ABNORMAL HIGH (ref 70–99)

## 2022-02-01 LAB — PROCALCITONIN: Procalcitonin: 0.1 ng/mL

## 2022-02-01 MED ORDER — PIPERACILLIN-TAZOBACTAM 3.375 G IVPB
INTRAVENOUS | Status: AC
  Filled 2022-02-01: qty 50

## 2022-02-01 MED ORDER — SODIUM CHLORIDE 0.9 % IV BOLUS
1000.0000 mL | Freq: Once | INTRAVENOUS | Status: AC
Start: 2022-02-01 — End: 2022-02-01
  Administered 2022-02-01: 1000 mL via INTRAVENOUS

## 2022-02-01 MED ORDER — SODIUM ZIRCONIUM CYCLOSILICATE 5 G PO PACK
10.0000 g | PACK | Freq: Once | ORAL | Status: AC
Start: 2022-02-01 — End: 2022-02-01
  Administered 2022-02-01: 10 g via ORAL
  Filled 2022-02-01: qty 2

## 2022-02-01 MED ORDER — CALCIUM GLUCONATE-NACL 1-0.675 GM/50ML-% IV SOLN
1.0000 g | Freq: Once | INTRAVENOUS | Status: AC
  Administered 2022-02-01: 1000 mg via INTRAVENOUS
  Filled 2022-02-01: qty 50

## 2022-02-01 MED ORDER — INSULIN ASPART 100 UNIT/ML IV SOLN
5.0000 [IU] | Freq: Once | INTRAVENOUS | Status: AC
  Administered 2022-02-01: 5 [IU] via INTRAVENOUS

## 2022-02-01 MED ORDER — SODIUM CHLORIDE 0.9 % IV BOLUS
1000.0000 mL | Freq: Once | INTRAVENOUS | Status: AC
  Administered 2022-02-01: 1000 mL via INTRAVENOUS

## 2022-02-01 MED ORDER — PIPERACILLIN-TAZOBACTAM 3.375 G IVPB 30 MIN
3.3750 g | Freq: Three times a day (TID) | INTRAVENOUS | Status: DC
  Administered 2022-02-01 – 2022-02-02 (×2): 3.375 g via INTRAVENOUS
  Filled 2022-02-01: qty 50

## 2022-02-01 MED ORDER — SOLIFENACIN SUCCINATE 10 MG PO TABS: 10.0000 mg | ORAL_TABLET | Freq: Every day | ORAL | 11 refills | Status: DC

## 2022-02-01 MED ORDER — DEXTROSE 50 % IV SOLN
1.0000 | Freq: Once | INTRAVENOUS | Status: AC
  Administered 2022-02-01: 50 mL via INTRAVENOUS
  Filled 2022-02-01: qty 50

## 2022-02-01 NOTE — ED Provider Notes (Signed)
Care transferred from [name], Patricia [name] at time of sign out. See their note for full assessment.   Briefly: Patient is 71 y.o. female who presents to the ED with concerns for hypotension.     Plan: Plan per previous Patricia [name]: admission to the hospital due to AKI, hypotension, AMS.  Clinical Course as of 02/01/22 2259  Tue Feb 01, 2022  1958 Consult with nephro [SB]  1959 Consult with Nephrologist, Dr. Juel Burrow who recommends treatment for Hyperkalemia treatment with calcium gluconate, D50, 8-10 unit of insulin. 10 of lokelma BID x 2-4 doses. Obstructive uropathy rule out with Renal US. Also will be available for consult.  [SB]  2020 Discussed with patient and family updates regarding treatment by the nephrologist, family agreeable at this time. [SB]  2106 Consult with urologist, Dr. Berneice Heinrich who recommends patient being admitted to the hospital likely transferred over to [facility] for stent to be placed in the [name].m. due to findings on CT scan as well as renal function. [SB]  2247 Discussed with patient and family regarding recommendations from urologist, answered all available questions.  Patient family agreeable this time. [SB]  2248 Consult with hospitalist, Dr. Elizabeth Sauer who will evaluate the patient for admission. [SB]  2248 Discussed with patient and family regarding plans for admission.  Patient and family agreeable this time.  Patient appears safe for admission at this time. [SB]    Clinical Course User Index [SB] Patricia [name], Patricia [name]     Presentation suspicious for elevated creatinine likely in the setting of stone in the ureter causing hydronephrosis.  Discussion with nephrology as well as urologist who recommends patient be admitted for stent in the morning.  Discussed with patient and family plans for admission.  Patient agreeable at this time.  Patient appears safe for admission at this time.   This chart was dictated using voice recognition software, Dragon. Despite the best efforts of  this provider to proofread and correct errors, errors may still occur which can change documentation meaning.   Patricia [name], Patricia [name] 02/01/22 0947    Patricia Berkshire, MD 02/02/22 1250

## 2022-02-01 NOTE — ED Notes (Signed)
EDP aware of VS 

## 2022-02-01 NOTE — ED Notes (Signed)
Pt has not been able to urinate yet

## 2022-02-01 NOTE — Addendum Note (Signed)
Addended by: Sehar Sedano R on: 02/01/2022 04:40 PM   Modules accepted: Orders  

## 2022-02-01 NOTE — ED Provider Notes (Signed)
[facility] EMERGENCY DEPARTMENT Provider Note   CSN: [id number] Arrival date & time: 02/01/22  1422     History  Chief Complaint  Patient presents with   Hypotension    Patricia [name] is a 71 y.o. female.  Pt complains of weakness.  Pt's family reports pt fell today.  No known injuries  no known head impact.  No loc.  (Family reports pt weak and had to be helped to the ground)  Family reports pt is confused.  Pt seen 2 days ago for vomiting and diarrhea.  Pt saw her urologist today.  Pt seen in ED 2 days ago for vomiting and diarrhea.  Pt admits to decreased oral intake.  Pt's family reports pt is confused    The history is provided by the patient. No language interpreter was used.       Home Medications Prior to Admission medications   Medication Sig Start Date End Date Taking? Authorizing Provider  APPLE CIDER VINEGAR PO Take 1 tablet by mouth daily.    [provider]  cetirizine (ZYRTEC) 10 MG tablet Take 10 mg by mouth daily.    [provider]  CINNAMON PO Take 1,000 mg by mouth daily.    [provider]  ciprofloxacin (CIPRO) 500 MG tablet Take 1 tablet (500 mg total) by mouth every 12 (twelve) hours. 01/30/22   Lonell Grandchild, MD  cyclobenzaprine (FLEXERIL) 10 MG tablet Take 10 mg by mouth 3 (three) times daily as needed for muscle spasms.    [provider]  escitalopram (LEXAPRO) 5 MG tablet Take 5 mg by mouth daily. 07/17/19   [provider]  levothyroxine (SYNTHROID) 150 MCG tablet Take 150 mcg by mouth daily. 07/05/19   [provider]  meclizine (ANTIVERT) 25 MG tablet Take 25 mg by mouth daily as needed for nausea. 11/12/20   [provider]  meloxicam (MOBIC) 15 MG tablet Take 15 mg by mouth as needed for pain.    [provider]  metFORMIN (GLUCOPHAGE) 1000 MG tablet Take 1,000 mg by mouth 2 (two) times daily. 11/25/19   [provider]  olmesartan (BENICAR) 40 MG tablet Take 40  mg by mouth daily. 04/05/19   [provider]  Omega-3 Fatty Acids (FISH OIL) 1000 MG CAPS Take 1 capsule by mouth daily.    [provider]  ondansetron (ZOFRAN) 4 MG tablet Take 1 tablet (4 mg total) by mouth every 6 (six) hours. 01/30/22   Lonell Grandchild, MD  simvastatin (ZOCOR) 20 MG tablet Take 20 mg by mouth daily.    [provider]  solifenacin (VESICARE) 10 MG tablet Take 1 tablet (10 mg total) by mouth daily. 02/01/22   Marcine Matar, MD      Allergies    Amlodipine and Keflex [cephalexin]    Review of Systems   Review of Systems  Constitutional:  Negative for fever.  Gastrointestinal:  Positive for diarrhea, nausea and vomiting.  Neurological:  Positive for light-headedness.  Psychiatric/Behavioral:  Positive for confusion.   All other systems reviewed and are negative.   Physical Exam Updated Vital Signs BP (!) 106/48   Pulse 73   Temp 98.1 F (36.7 C)   Resp 17   Ht 5\' 4"  (1.626 m)   Wt 83.5 kg   SpO2 100%   BMI 31.58 kg/m  Physical Exam Vitals and nursing note reviewed.  Constitutional:      Appearance: She is well-developed.  HENT:  Head: Normocephalic.  Cardiovascular:     Rate and Rhythm: Normal rate.  Pulmonary:     Effort: Pulmonary effort is normal.  Abdominal:     General: Abdomen is flat. There is no distension.  Musculoskeletal:        General: Normal range of motion.     Cervical back: Normal range of motion.  Skin:    General: Skin is warm.  Neurological:     General: No focal deficit present.     Mental Status: She is alert and oriented to person, place, and time.     ED Results / Procedures / Treatments   Labs (all labs ordered are listed, but only abnormal results are displayed) Labs Reviewed  CBC WITH DIFFERENTIAL/PLATELET - Abnormal; Notable for the following components:      Result Value   WBC 13.1 (*)    RDW 15.7 (*)    Neutro Abs 9.5 (*)    Monocytes Absolute 1.1 (*)    Abs Immature  Granulocytes 0.09 (*)    All other components within normal limits  COMPREHENSIVE METABOLIC PANEL - Abnormal; Notable for the following components:   Potassium 6.0 (*)    Chloride 116 (*)    CO2 11 (*)    Glucose, Bld 134 (*)    BUN 49 (*)    Creatinine, Ser 5.33 (*)    GFR, Estimated 8 (*)    All other components within normal limits  CBG MONITORING, ED - Abnormal; Notable for the following components:   Glucose-Capillary 118 (*)    All other components within normal limits  URINALYSIS, ROUTINE W REFLEX MICROSCOPIC  TROPONIN I (HIGH SENSITIVITY)  TROPONIN I (HIGH SENSITIVITY)    EKG None  Radiology CT Head Wo Contrast  Result Date: 02/01/2022 CLINICAL DATA:  Head trauma, minor (Age >= 65y) EXAM: CT HEAD WITHOUT CONTRAST TECHNIQUE: Contiguous axial images were obtained from the base of the skull through the vertex without intravenous contrast. RADIATION DOSE REDUCTION: This exam was performed according to the departmental dose-optimization program which includes automated exposure control, adjustment of the mA and/or kV according to patient size and/or use of iterative reconstruction technique. COMPARISON:  None Available. FINDINGS: Brain: There is no acute intracranial hemorrhage, mass effect, or edema. Gray-white differentiation is preserved. There is no extra-axial fluid collection. Ventricles and sulci are within normal limits in size and configuration. Vascular: There is atherosclerotic calcification at the skull base. Skull: Calvarium is unremarkable. Sinuses/Orbits: No acute finding. Other: None. IMPRESSION: No acute intracranial abnormality. Electronically Signed   By: Guadlupe Spanish M.D.   On: 02/01/2022 15:19    Procedures Procedures    Medications Ordered in ED Medications  sodium chloride 0.9 % bolus 1,000 mL (0 mLs Intravenous Stopped 02/01/22 1751)    ED Course/ Medical Decision Making/ A&P                           Medical Decision Making Pt complains of weakness  and near fall.  No injuries   Amount and/or Complexity of Data Reviewed Independent Historian: caregiver    Details: Family here with pt External Data Reviewed: labs and notes.    Details: Urology and ED notes reviewed Labs: ordered. Decision-making details documented in ED Course.    Details: potassium is 6.0.  BUn is 49. Creatine is 5.33.   (Bun and creatine have increased significantly in the last 2 days ) Radiology: ordered and independent interpretation performed. Decision-making details  documented in ED Course.    Details: Ct head  no acute abnormality ECG/medicine tests: ordered and independent interpretation performed. Decision-making details documented in ED Course.    Details: EKg  no acute changes Discussion of management or test interpretation with external provider(s): Hospitalist will be consulted  Risk Risk Details: Pt had initial blood pressure 80/40.  Bp improved with IV fluids.  Pt hypotensive, elevated renal functions and potassium.  I suspect dehydration second to vomiting, diarrhea and decreased intake.             Final Clinical Impression(s) / ED Diagnoses Final diagnoses:  Hypotension, unspecified hypotension type  Acute kidney injury (HCC)  Hyperkalemia  Altered mental status, unspecified altered mental status type    Rx / DC Orders ED Discharge Orders     None         Osie Cheeks 02/01/22 Ronni Rumble, MD 02/02/22 1249

## 2022-02-01 NOTE — ED Triage Notes (Signed)
Pt arrives via [facility] for hypotension. EMS was called bc pt was seated on steps outside and unable to get back inside due to dizziness. Pt denies fall, no head injury. Initial BP 78/48 and placed in trendelenburg position. BP improved temporarily, but became hypotensive again throughout transport. Pt denies CP.

## 2022-02-02 ENCOUNTER — Other Ambulatory Visit: Payer: Self-pay

## 2022-02-02 ENCOUNTER — Encounter (HOSPITAL_COMMUNITY): Payer: Self-pay | Admitting: Family Medicine

## 2022-02-02 ENCOUNTER — Inpatient Hospital Stay (HOSPITAL_COMMUNITY): Payer: PPO | Admitting: Anesthesiology

## 2022-02-02 ENCOUNTER — Inpatient Hospital Stay (HOSPITAL_COMMUNITY): Payer: PPO

## 2022-02-02 ENCOUNTER — Encounter (HOSPITAL_COMMUNITY)
Admission: EM | Disposition: A | Discharge status: Home / Self Care | Payer: Self-pay | Source: Home / Self Care | Attending: Family Medicine

## 2022-02-02 DIAGNOSIS — N390 Urinary tract infection, site not specified: Secondary | ICD-10-CM

## 2022-02-02 DIAGNOSIS — N201 Calculus of ureter: Secondary | ICD-10-CM

## 2022-02-02 DIAGNOSIS — I10 Essential (primary) hypertension: Secondary | ICD-10-CM

## 2022-02-02 DIAGNOSIS — N10 Acute tubulo-interstitial nephritis: Secondary | ICD-10-CM

## 2022-02-02 DIAGNOSIS — N1330 Unspecified hydronephrosis: Secondary | ICD-10-CM | POA: Diagnosis not present

## 2022-02-02 DIAGNOSIS — E119 Type 2 diabetes mellitus without complications: Secondary | ICD-10-CM

## 2022-02-02 DIAGNOSIS — N179 Acute kidney failure, unspecified: Secondary | ICD-10-CM

## 2022-02-02 DIAGNOSIS — A419 Sepsis, unspecified organism: Secondary | ICD-10-CM | POA: Diagnosis not present

## 2022-02-02 DIAGNOSIS — E875 Hyperkalemia: Secondary | ICD-10-CM

## 2022-02-02 DIAGNOSIS — Z7984 Long term (current) use of oral hypoglycemic drugs: Secondary | ICD-10-CM

## 2022-02-02 DIAGNOSIS — N200 Calculus of kidney: Secondary | ICD-10-CM | POA: Diagnosis not present

## 2022-02-02 DIAGNOSIS — E039 Hypothyroidism, unspecified: Secondary | ICD-10-CM

## 2022-02-02 HISTORY — PX: CYSTOSCOPY W/ URETERAL STENT PLACEMENT: SHX1429

## 2022-02-02 LAB — MRSA NEXT GEN BY PCR, NASAL: MRSA by PCR Next Gen: NOT DETECTED

## 2022-02-02 LAB — CBC WITH DIFFERENTIAL/PLATELET
Abs Immature Granulocytes: 0.06 10*3/uL (ref 0.00–0.07)
Basophils Absolute: 0.1 10*3/uL (ref 0.0–0.1)
Basophils Relative: 1 %
Eosinophils Absolute: 0.1 10*3/uL (ref 0.0–0.5)
Eosinophils Relative: 1 %
HCT: 38.8 % (ref 36.0–46.0)
Hemoglobin: 11.8 g/dL — ABNORMAL LOW (ref 12.0–15.0)
Immature Granulocytes: 1 %
Lymphocytes Relative: 34 %
Lymphs Abs: 3.2 10*3/uL (ref 0.7–4.0)
MCH: 29.4 pg (ref 26.0–34.0)
MCHC: 30.4 g/dL (ref 30.0–36.0)
MCV: 96.8 fL (ref 80.0–100.0)
Monocytes Absolute: 1 10*3/uL (ref 0.1–1.0)
Monocytes Relative: 10 %
Neutro Abs: 5.2 10*3/uL (ref 1.7–7.7)
Neutrophils Relative %: 53 %
Platelets: 255 10*3/uL (ref 150–400)
RBC: 4.01 MIL/uL (ref 3.87–5.11)
RDW: 15.6 % — ABNORMAL HIGH (ref 11.5–15.5)
WBC: 9.6 10*3/uL (ref 4.0–10.5)
nRBC: 0 % (ref 0.0–0.2)

## 2022-02-02 LAB — COMPREHENSIVE METABOLIC PANEL
ALT: 23 U/L (ref 0–44)
AST: 23 U/L (ref 15–41)
Albumin: 3.4 g/dL — ABNORMAL LOW (ref 3.5–5.0)
Alkaline Phosphatase: 54 U/L (ref 38–126)
Anion gap: 6 (ref 5–15)
BUN: 48 mg/dL — ABNORMAL HIGH (ref 8–23)
CO2: 14 mmol/L — ABNORMAL LOW (ref 22–32)
Calcium: 8.9 mg/dL (ref 8.9–10.3)
Chloride: 117 mmol/L — ABNORMAL HIGH (ref 98–111)
Creatinine, Ser: 4.54 mg/dL — ABNORMAL HIGH (ref 0.44–1.00)
GFR, Estimated: 10 mL/min — ABNORMAL LOW (ref >60)
Glucose, Bld: 102 mg/dL — ABNORMAL HIGH (ref 70–99)
Potassium: 5.2 mmol/L — ABNORMAL HIGH (ref 3.5–5.1)
Sodium: 137 mmol/L (ref 135–145)
Total Bilirubin: 0.3 mg/dL (ref 0.3–1.2)
Total Protein: 6.3 g/dL — ABNORMAL LOW (ref 6.5–8.1)

## 2022-02-02 LAB — PROCALCITONIN: Procalcitonin: 0.12 ng/mL

## 2022-02-02 LAB — MAGNESIUM: Magnesium: 1.8 mg/dL (ref 1.7–2.4)

## 2022-02-02 LAB — GLUCOSE, CAPILLARY
Glucose-Capillary: 82 mg/dL (ref 70–99)
Glucose-Capillary: 114 mg/dL — ABNORMAL HIGH (ref 70–99)
Glucose-Capillary: 66 mg/dL — ABNORMAL LOW (ref 70–99)
Glucose-Capillary: 70 mg/dL (ref 70–99)
Glucose-Capillary: 99 mg/dL (ref 70–99)
Glucose-Capillary: 113 mg/dL — ABNORMAL HIGH (ref 70–99)
Glucose-Capillary: 117 mg/dL — ABNORMAL HIGH (ref 70–99)

## 2022-02-02 LAB — PROTIME-INR
INR: 1.1 (ref 0.8–1.2)
Prothrombin Time: 14.3 seconds (ref 11.4–15.2)

## 2022-02-02 LAB — HIV ANTIBODY (ROUTINE TESTING W REFLEX): HIV Screen 4th Generation wRfx: NONREACTIVE

## 2022-02-02 LAB — CORTISOL-AM, BLOOD: Cortisol - AM: 9.5 ug/dL (ref 6.7–22.6)

## 2022-02-02 LAB — TSH: TSH: 0.461 u[IU]/mL (ref 0.350–4.500)

## 2022-02-02 SURGERY — CYSTOSCOPY, WITH RETROGRADE PYELOGRAM AND URETERAL STENT INSERTION
Anesthesia: General | Site: Ureter | Laterality: Bilateral

## 2022-02-02 MED ORDER — DIATRIZOATE MEGLUMINE 30 % UR SOLN
URETHRAL | Status: DC | PRN
  Administered 2022-02-02: 100 mL via URETHRAL

## 2022-02-02 MED ORDER — FENTANYL CITRATE (PF) 250 MCG/5ML IJ SOLN
INTRAMUSCULAR | Status: DC | PRN
Start: 2022-02-02 — End: 2022-02-02
  Administered 2022-02-02: 25 ug via INTRAVENOUS

## 2022-02-02 MED ORDER — DEXTROSE 50 % IV SOLN
INTRAVENOUS | Status: AC
  Filled 2022-02-02: qty 50

## 2022-02-02 MED ORDER — ONDANSETRON HCL 4 MG/2ML IJ SOLN
INTRAMUSCULAR | Status: AC
  Filled 2022-02-02: qty 2

## 2022-02-02 MED ORDER — EPHEDRINE SULFATE-NACL 50-0.9 MG/10ML-% IV SOSY
PREFILLED_SYRINGE | INTRAVENOUS | Status: DC | PRN
  Administered 2022-02-02: 5 mg via INTRAVENOUS

## 2022-02-02 MED ORDER — ACETAMINOPHEN 325 MG PO TABS
650.0000 mg | ORAL_TABLET | Freq: Four times a day (QID) | ORAL | Status: DC | PRN
  Administered 2022-02-02 – 2022-02-03 (×2): 650 mg via ORAL
  Filled 2022-02-02 (×2): qty 2

## 2022-02-02 MED ORDER — ORAL CARE MOUTH RINSE: 15.0000 mL | Freq: Once | OROMUCOSAL | Status: AC

## 2022-02-02 MED ORDER — STERILE WATER FOR IRRIGATION IR SOLN
Status: DC | PRN
  Administered 2022-02-02: 500 mL

## 2022-02-02 MED ORDER — ACETAMINOPHEN 650 MG RE SUPP: 650.0000 mg | Freq: Four times a day (QID) | RECTAL | Status: DC | PRN

## 2022-02-02 MED ORDER — HEPARIN SODIUM (PORCINE) 5000 UNIT/ML IJ SOLN
5000.0000 [IU] | Freq: Three times a day (TID) | INTRAMUSCULAR | Status: DC
  Administered 2022-02-02 – 2022-02-05 (×9): 5000 [IU] via SUBCUTANEOUS
  Filled 2022-02-02 (×9): qty 1

## 2022-02-02 MED ORDER — LIDOCAINE VISCOUS HCL 2 % MT SOLN
OROMUCOSAL | Status: AC
  Filled 2022-02-02: qty 15

## 2022-02-02 MED ORDER — PROPOFOL 500 MG/50ML IV EMUL
INTRAVENOUS | Status: DC | PRN
  Administered 2022-02-02: 75 ug/kg/min via INTRAVENOUS

## 2022-02-02 MED ORDER — SODIUM CHLORIDE 0.9 % IV SOLN: INTRAVENOUS | Status: DC

## 2022-02-02 MED ORDER — OXYCODONE HCL 5 MG PO TABS: 5.0000 mg | ORAL_TABLET | ORAL | Status: DC | PRN

## 2022-02-02 MED ORDER — SIMVASTATIN 20 MG PO TABS
20.0000 mg | ORAL_TABLET | Freq: Every day | ORAL | Status: DC
  Administered 2022-02-03 – 2022-02-04 (×2): 20 mg via ORAL
  Filled 2022-02-02 (×2): qty 1

## 2022-02-02 MED ORDER — ONDANSETRON HCL 4 MG/2ML IJ SOLN: 4.0000 mg | Freq: Four times a day (QID) | INTRAMUSCULAR | Status: DC | PRN

## 2022-02-02 MED ORDER — METOCLOPRAMIDE HCL 5 MG/ML IJ SOLN: 10.0000 mg | Freq: Once | INTRAMUSCULAR | Status: DC

## 2022-02-02 MED ORDER — CHLORHEXIDINE GLUCONATE 0.12 % MT SOLN
15.0000 mL | Freq: Once | OROMUCOSAL | Status: AC
  Administered 2022-02-02: 15 mL via OROMUCOSAL

## 2022-02-02 MED ORDER — PREGABALIN 75 MG PO CAPS
75.0000 mg | ORAL_CAPSULE | Freq: Two times a day (BID) | ORAL | Status: DC
  Administered 2022-02-02 – 2022-02-04 (×5): 75 mg via ORAL
  Filled 2022-02-02 (×5): qty 1

## 2022-02-02 MED ORDER — ONDANSETRON HCL 4 MG PO TABS: 4.0000 mg | ORAL_TABLET | Freq: Four times a day (QID) | ORAL | Status: DC | PRN

## 2022-02-02 MED ORDER — CHLORHEXIDINE GLUCONATE CLOTH 2 % EX PADS
6.0000 | MEDICATED_PAD | Freq: Every day | CUTANEOUS | Status: DC
  Administered 2022-02-02 – 2022-02-04 (×3): 6 via TOPICAL

## 2022-02-02 MED ORDER — DEXTROSE 50 % IV SOLN
1.0000 | Freq: Once | INTRAVENOUS | Status: AC
  Administered 2022-02-02: 50 mL via INTRAVENOUS

## 2022-02-02 MED ORDER — CITALOPRAM HYDROBROMIDE 20 MG PO TABS
20.0000 mg | ORAL_TABLET | Freq: Every day | ORAL | Status: DC
  Administered 2022-02-03: 20 mg via ORAL
  Filled 2022-02-02 (×2): qty 1

## 2022-02-02 MED ORDER — LEVOTHYROXINE SODIUM 75 MCG PO TABS
175.0000 ug | ORAL_TABLET | Freq: Every day | ORAL | Status: DC
  Administered 2022-02-02 – 2022-02-05 (×4): 175 ug via ORAL
  Filled 2022-02-02: qty 1
  Filled 2022-02-02: qty 4
  Filled 2022-02-02 (×2): qty 1

## 2022-02-02 MED ORDER — HEPARIN SODIUM (PORCINE) 5000 UNIT/ML IJ SOLN: 5000.0000 [IU] | Freq: Three times a day (TID) | INTRAMUSCULAR | Status: DC

## 2022-02-02 MED ORDER — METOCLOPRAMIDE HCL 5 MG/ML IJ SOLN
10.0000 mg | Freq: Once | INTRAMUSCULAR | Status: AC
  Administered 2022-02-02: 10 mg via INTRAVENOUS

## 2022-02-02 MED ORDER — DIATRIZOATE MEGLUMINE 30 % UR SOLN
URETHRAL | Status: AC
  Filled 2022-02-02: qty 100

## 2022-02-02 MED ORDER — ONDANSETRON HCL 4 MG/2ML IJ SOLN
INTRAMUSCULAR | Status: DC | PRN
  Administered 2022-02-02: 4 mg via INTRAVENOUS

## 2022-02-02 MED ORDER — PIPERACILLIN-TAZOBACTAM 3.375 G IVPB 30 MIN
3.3750 g | Freq: Two times a day (BID) | INTRAVENOUS | Status: DC
  Filled 2022-02-02 (×2): qty 50

## 2022-02-02 MED ORDER — HYDROMORPHONE HCL 1 MG/ML IJ SOLN: 0.2500 mg | INTRAMUSCULAR | Status: DC | PRN

## 2022-02-02 MED ORDER — PREGABALIN 75 MG PO CAPS
200.0000 mg | ORAL_CAPSULE | Freq: Two times a day (BID) | ORAL | Status: DC
  Administered 2022-02-02: 200 mg via ORAL
  Filled 2022-02-02: qty 1

## 2022-02-02 MED ORDER — FESOTERODINE FUMARATE ER 4 MG PO TB24
4.0000 mg | ORAL_TABLET | Freq: Every day | ORAL | Status: DC
  Administered 2022-02-03 – 2022-02-04 (×2): 4 mg via ORAL
  Filled 2022-02-02 (×3): qty 1

## 2022-02-02 MED ORDER — FENTANYL CITRATE (PF) 100 MCG/2ML IJ SOLN
INTRAMUSCULAR | Status: AC
  Filled 2022-02-02: qty 2

## 2022-02-02 MED ORDER — PIPERACILLIN-TAZOBACTAM 3.375 G IVPB
3.3750 g | Freq: Two times a day (BID) | INTRAVENOUS | Status: DC
  Administered 2022-02-02 – 2022-02-04 (×4): 3.375 g via INTRAVENOUS
  Filled 2022-02-02 (×4): qty 50

## 2022-02-02 MED ORDER — EPHEDRINE 5 MG/ML INJ
INTRAVENOUS | Status: AC
  Filled 2022-02-02: qty 5

## 2022-02-02 MED ORDER — DEXTROSE 50 % IV SOLN
50.0000 mL | Freq: Once | INTRAVENOUS | Status: AC
  Administered 2022-02-02: 50 mL via INTRAVENOUS

## 2022-02-02 MED ORDER — METOCLOPRAMIDE HCL 5 MG/ML IJ SOLN
INTRAMUSCULAR | Status: AC
  Filled 2022-02-02: qty 2

## 2022-02-02 MED ORDER — SODIUM CHLORIDE 0.9 % IR SOLN
Status: DC | PRN
  Administered 2022-02-02 (×2): 3000 mL

## 2022-02-02 SURGICAL SUPPLY — 24 items
BAG DRAIN URO TABLE W/ADPT NS (BAG) ×1 IMPLANT
BAG DRN 8 ADPR NS SKTRN CSTL (BAG) ×1
BAG DRN RND TRDRP ANRFLXCHMBR (UROLOGICAL SUPPLIES) ×1
BAG HAMPER (MISCELLANEOUS) ×1 IMPLANT
BAG URINE DRAIN 2000ML AR STRL (UROLOGICAL SUPPLIES) IMPLANT
CATH FOLEY 2WAY SLVR  5CC 16FR (CATHETERS) ×1
CATH FOLEY 2WAY SLVR 5CC 16FR (CATHETERS) IMPLANT
CATH INTERMIT  6FR 70CM (CATHETERS) ×1 IMPLANT
CLOTH BEACON ORANGE TIMEOUT ST (SAFETY) ×1 IMPLANT
GLOVE BIO SURGEON STRL SZ8 (GLOVE) ×1 IMPLANT
GLOVE BIOGEL PI IND STRL 7.0 (GLOVE) ×2 IMPLANT
GLOVE BIOGEL PI INDICATOR 7.0 (GLOVE) ×2
GOWN STRL REUS W/TWL LRG LVL3 (GOWN DISPOSABLE) ×1 IMPLANT
GOWN STRL REUS W/TWL XL LVL3 (GOWN DISPOSABLE) ×1 IMPLANT
GUIDEWIRE STR ZIPWIRE 035X150 (MISCELLANEOUS) ×1 IMPLANT
IV NS IRRIG 3000ML ARTHROMATIC (IV SOLUTION) ×1 IMPLANT
KIT TURNOVER CYSTO (KITS) ×1 IMPLANT
MANIFOLD NEPTUNE II (INSTRUMENTS) ×1 IMPLANT
PACK CYSTO (CUSTOM PROCEDURE TRAY) ×1 IMPLANT
PAD ARMBOARD 7.5X6 YLW CONV (MISCELLANEOUS) ×1 IMPLANT
STENT URET 6FRX26 CONTOUR (STENTS) IMPLANT
SYR 10ML LL (SYRINGE) ×1 IMPLANT
TOWEL OR 17X26 4PK STRL BLUE (TOWEL DISPOSABLE) ×1 IMPLANT
WATER STERILE IRR 500ML POUR (IV SOLUTION) ×1 IMPLANT

## 2022-02-02 NOTE — Assessment & Plan Note (Addendum)
-   Was Located in the distal right ureter -Right-sided hydronephrosis -Discussed the case with urologist Dr. Ronne Binning   -02/02/2022:  status post cystoscopy and bilateral double-J stent placement by Dr. Ronne Binning   Patient tolerated procedure well -Foley catheter in place>>> was discontinued 02/04/2022

## 2022-02-02 NOTE — Hospital Course (Signed)
Patricia [name] is a 71 y.o. female with medical history significant of diabetes mellitus type 2, hypertension, hypothyroidism, and more presents the ED with a chief complaint of hypotension.  Unfortunately patient is not able to provide much history.  She knows herself and where she is at, but when asked the year she is peds her full name.  She reports she had been "down there."  When you try to clarify exact location, she reports she is not really sure.  She is also unable to characterize the pain due to not being "really sure." Patient was hypotensive at presentation.  She was given 2 L bolus, and admission was requested for dehydration.  With her significant AKI with a creatinine up to 5.33, Kelly recommended CT abdomen pelvis.  It did show an obstructing stone.  UA also was indicative of UTI.  Zosyn was started at admission.  Urology was consulted and recommends admission to [facility] for stent placement

## 2022-02-02 NOTE — Assessment & Plan Note (Addendum)
-   Continue diltiazem, discontinued Olmesartan due to AKI -BP remained stable

## 2022-02-02 NOTE — Progress Notes (Addendum)
Patient arrived at 1416 from [facility] no working IV nurse aware, reported Dextrose given for BG of 66 reported now 99 we tested and 70. Assessed skin finger tips blue and frigid. Right for arm infiltrated applied heat and elevated. AC getting [place] for new IV placement.

## 2022-02-02 NOTE — Plan of Care (Signed)
Arrived from PACCU/OR after bilateral stents placed in kidneys, alert not completely oriented some confusion on date and where she is, request diet order. No c/o pain, nausea or SOB on RA. Continue to monitor. Problem: Fluid Volume: Goal: Hemodynamic stability will improve Outcome: Progressing   Problem: Clinical Measurements: Goal: Diagnostic test results will improve Outcome: Progressing Goal: Signs and symptoms of infection will decrease Outcome: Progressing   Problem: Respiratory: Goal: Ability to maintain adequate ventilation will improve Outcome: Progressing   Problem: Education: Goal: Knowledge of General Education information will improve Description: Including pain rating scale, medication(s)/side effects and non-pharmacologic comfort measures Outcome: Progressing   Problem: Health Behavior/Discharge Planning: Goal: Ability to manage health-related needs will improve Outcome: Progressing   Problem: Clinical Measurements: Goal: Ability to maintain clinical measurements within normal limits will improve Outcome: Progressing Goal: Will remain free from infection Outcome: Progressing Goal: Diagnostic test results will improve Outcome: Progressing Goal: Respiratory complications will improve Outcome: Progressing Goal: Cardiovascular complication will be avoided Outcome: Progressing   Problem: Activity: Goal: Risk for activity intolerance will decrease Outcome: Progressing   Problem: Nutrition: Goal: Adequate nutrition will be maintained Outcome: Progressing   Problem: Coping: Goal: Level of anxiety will decrease Outcome: Progressing   Problem: Elimination: Goal: Will not experience complications related to bowel motility Outcome: Progressing Goal: Will not experience complications related to urinary retention Outcome: Progressing   Problem: Pain Managment: Goal: General experience of comfort will improve Outcome: Progressing   Problem: Safety: Goal:  Ability to remain free from injury will improve Outcome: Progressing   Problem: Skin Integrity: Goal: Risk for impaired skin integrity will decrease Outcome: Progressing

## 2022-02-02 NOTE — Assessment & Plan Note (Addendum)
Was on on Cipro as outpatient on admission Cephalosporin allergy -IV antibiotics Zosyn discontinued 02/05/2022, started on p.o. Bactrim

## 2022-02-02 NOTE — Assessment & Plan Note (Addendum)
-   Secondary to AKI -Calcium gluconate, insulin, 2 L normal saline, Lokelma given in the ED -Potassium has improved from 6.0-5.3 >>> 5.2>>4.7 >> 4.0   --Continue to trend, monitoring electrolytes

## 2022-02-02 NOTE — Anesthesia Preprocedure Evaluation (Addendum)
Anesthesia Evaluation  Patient identified by MRN, date of birth, ID band Patient awake    Reviewed: Allergy & Precautions, NPO status , Patient's Chart, lab work & pertinent test results  History of Anesthesia Complications (+) PONV and history of anesthetic complications  Airway Mallampati: II  TM Distance: >3 FB Neck ROM: Full    Dental  (+) Dental Advisory Given, Teeth Intact   Pulmonary neg pulmonary ROS,    Pulmonary exam normal breath sounds clear to auscultation       Cardiovascular hypertension, Pt. on medications Normal cardiovascular exam Rhythm:Regular Rate:Normal     Neuro/Psych negative neurological ROS  negative psych ROS   GI/Hepatic Neg liver ROS, GERD  Medicated,  Endo/Other  diabetes, Well Controlled, Type 2, Oral Hypoglycemic AgentsHypothyroidism   Renal/GU Renal disease  negative genitourinary   Musculoskeletal  (+) Arthritis , Osteoarthritis,    Abdominal   Peds negative pediatric ROS (+)  Hematology negative hematology ROS (+)   Anesthesia Other Findings   Reproductive/Obstetrics negative OB ROS                            Anesthesia Physical Anesthesia Plan  ASA: 4 and emergent  Anesthesia Plan: General   Post-op Pain Management: Dilaudid IV   Induction:   PONV Risk Score and Plan: 4 or greater and Ondansetron, Metaclopromide and Propofol infusion  Airway Management Planned: Nasal Cannula, Natural Airway and Simple Face Mask  Additional Equipment:   Intra-op Plan:   Post-operative Plan:   Informed Consent: I have reviewed the patients History and Physical, chart, labs and discussed the procedure including the risks, benefits and alternatives for the proposed anesthesia with the patient or authorized representative who has indicated his/her understanding and acceptance.     Dental advisory given  Plan Discussed with: CRNA and Surgeon  Anesthesia  Plan Comments: (Possible GA with airway was discussed.)       Anesthesia Quick Evaluation

## 2022-02-02 NOTE — Assessment & Plan Note (Signed)
Continue Synthroid °

## 2022-02-02 NOTE — Progress Notes (Signed)
PROGRESS NOTE    Patient: Patricia [name]                            PCP: Sharilyn Sites, MD                    DOB: [birth year]            DOA: 02/01/2022 [id number]:[id number]             DOS: 02/02/2022, 10:50 AM   LOS: 1 day   Date of Service: The patient was seen and examined on 02/02/2022  Subjective:   The patient was seen and examined this morning. More awake this morning, hemodynamically stable Blood pressure has improved,  Foley catheter was placed  Brief Narrative:   Patricia [name] is a 71 y.o. female with medical history significant of diabetes mellitus type 2, hypertension, hypothyroidism, and more presents the ED with a chief complaint of hypotension.  Unfortunately patient is not able to provide much history.  She knows herself and where she is at, but when asked the year she is peds her full name.  She reports she had been "down there."  When you try to clarify exact location, she reports she is not really sure.  She is also unable to characterize the pain due to not being "really sure." Patient was hypotensive at presentation.  She was given 2 L bolus, and admission was requested for dehydration.  With her significant AKI with a creatinine up to 5.33, Kelly recommended CT abdomen pelvis.  It did show an obstructing stone.  UA also was indicative of UTI.  Zosyn was started at admission.  Urology was consulted and recommends admission to [facility] for stent placement    Assessment & Plan:   Principal Problem:   Sepsis ([place]) Active Problems:   Calculus of kidney   AKI (acute kidney injury) ([place])   Hyperkalemia   UTI (urinary tract infection)   Hypothyroidism   Essential hypertension     Assessment and Plan: * Sepsis ([place]) Met sepsis criteria on admission-source of infection UTI, obstructive -  WBC 13.1, RR 20, BP 75/36, AKI, UTI -Cephalosporin allergy -Recently on Cipro outpatient -Continue IV antibiotics of Zosyn -Urine culture >>> -Blood culture >>>   -Procalcitonin 0.12  -Continuing IV hydration, IV antibiotics, close monitoring at stepdown unit at [place] hypertension - Continue diltiazem, hold olmesartan due to AKI -As needed hydralazine  Hypothyroidism - Continue Synthroid  UTI (urinary tract infection) Started on Cipro outpatient Cephalosporin allergy Continue Zosyn  Hyperkalemia - Secondary to AKI -Calcium gluconate, insulin, 2 L normal saline, Lokelma given in the ED -Potassium has improved from 6.0-5.3 >>> 5.2 -Monitor on telemetry --Continue to trend, monitoring electrolytes  AKI (acute kidney injury) ([place]) - Creatinine up to 5.33 from 1.5 >>> 4.54 -Associated hyperkalemia and decreased bicarb down to 11 -CT abdomen shows obstructing calculus in the distal right ureter with right-sided hydronephrosis -Discussed the case with urology at [facility] and at [facility] Dr. Alyson Ingles who has agreed for patient to remain at [facility], and he will schedule the patient for stenting today   -Patient was given 2 L bolus -Continue IV hydration -Hold NSAIDs -Continue to monitor  Calculus of kidney - Located in the distal right ureter -Right-sided hydronephrosis -Discussed the case with urologist Dr. Alyson Ingles who will take the patient to the OR for possible right ureter stenting today 02/02/2022    -----------------------------------------------------------------------------------------------------------------------------------------------  Nutritional status:  The patient's BMI is: Body mass index is 31.58 kg/m. I agree with the assessment and plan as outlined  ---------------------------------------------------------------------------------------------------------------------------------------------- Cultures; Blood Cultures x 2 >> NGT Urine Culture  >>> NGT  -----------------------------------------------------------------------------------------------------------------------------------------------  DVT prophylaxis:   heparin injection 5,000 Units Start: 02/02/22 0600 SCDs Start: 02/02/22 0014   Code Status:   Code Status: Full Code  Family Communication: Discussed with daughter at bedside The above findings and plan of care has been discussed with patient (and family)  in detail,  they expressed understanding and agreement of above. -Advance care planning has been discussed.   Admission status:   Status is: Inpatient Remains inpatient appropriate because: [place] urology intervention, IV fluids, IV antibiotics     Procedures:   No admission procedures for hospital encounter.   Antimicrobials:  Anti-infectives (From admission, onward)    Start     Dose/Rate Route Frequency Ordered Stop   02/02/22 1800  [MAR Hold]  piperacillin-tazobactam (ZOSYN) IVPB 3.375 g        (MAR Hold since Wed 02/02/2022 at 1027.Hold Reason: Transfer to a Procedural area)   3.375 g 12.5 mL/hr over 240 Minutes Intravenous Every 12 hours 02/02/22 0820     02/01/22 2300  piperacillin-tazobactam (ZOSYN) IVPB 3.375 g  Status:  Discontinued        3.375 g 100 mL/hr over 30 Minutes Intravenous Every 8 hours 02/01/22 2246 02/02/22 0820        Medication:   [MAR Hold] citalopram  20 mg Oral Daily   [MAR Hold] fesoterodine  4 mg Oral Daily   [MAR Hold] heparin  5,000 Units Subcutaneous Q8H   [MAR Hold] levothyroxine  175 mcg Oral Daily   metoCLOPramide (REGLAN) injection  10 mg Intravenous Once   [MAR Hold] pregabalin  200 mg Oral BID   [MAR Hold] simvastatin  20 mg Oral Daily    [MAR Hold] acetaminophen **OR** [MAR Hold] acetaminophen, [MAR Hold] ondansetron **OR** [MAR Hold] ondansetron (ZOFRAN) IV, [MAR Hold] oxyCODONE   Objective:   Vitals:   02/02/22 0730 02/02/22 0811 02/02/22 1000 02/02/22 1019  BP: 116/70 (!) 104/53 (!) 115/53   Pulse: 73 71 67   Resp: '15 17 16   ' Temp:    98 F (36.7 C)  TempSrc:    Oral  SpO2: 100% 100% 100%   Weight:      Height:        Intake/Output Summary (Last 24  hours) at 02/02/2022 1050 Last data filed at 02/02/2022 3500 Gross per 24 hour  Intake 1250 ml  Output 200 ml  Net 1050 ml   Filed Weights   02/01/22 1426  Weight: 83.5 kg     Examination:   Physical Exam  Constitution:  Alert, cooperative, no distress,  Appears calm and comfortable  Psychiatric:   Normal and stable mood and affect, cognition intact,   HEENT:        Normocephalic, PERRL, otherwise with in Normal limits  Chest:         Chest symmetric Cardio vascular:  S1/S2, RRR, No murmure, No Rubs or Gallops  pulmonary: Clear to auscultation bilaterally, respirations unlabored, negative wheezes / crackles Abdomen: Soft, non-tender, non-distended, bowel sounds,no masses, no organomegaly Muscular skeletal: Limited exam - in bed, able to move all 4 extremities,   Neuro: CNII-XII intact. , normal motor and sensation, reflexes intact  Extremities: No pitting edema lower extremities, +2 pulses  Skin: Dry, warm to touch, negative for any Rashes, No open wounds Wounds:  per nursing documentation Foley catheter in place  ------------------------------------------------------------------------------------------------------------------------------------------    LABs:     Latest Ref Rng & Units 02/02/2022   12:24 AM 02/01/2022    2:51 PM 01/30/2022   11:08 AM  CBC  WBC 4.0 - 10.5 K/uL 9.6  13.1  17.4   Hemoglobin 12.0 - 15.0 g/dL 11.8  12.8  13.7   Hematocrit 36.0 - 46.0 % 38.8  40.0  43.1   Platelets 150 - 400 K/uL 255  367  339       Latest Ref Rng & Units 02/02/2022   12:24 AM 02/01/2022   11:00 PM 02/01/2022    2:51 PM  CMP  Glucose 70 - 99 mg/dL 102  123  134   BUN 8 - 23 mg/dL 48  50  49   Creatinine 0.44 - 1.00 mg/dL 4.54  4.84  5.33   Sodium 135 - 145 mmol/L 137  136  137   Potassium 3.5 - 5.1 mmol/L 5.2  5.4  6.0   Chloride 98 - 111 mmol/L 117  117  116   CO2 22 - 32 mmol/L '14  12  11   ' Calcium 8.9 - 10.3 mg/dL 8.9  8.7  9.8   Total Protein 6.5 - 8.1 g/dL 6.3   7.3    Total Bilirubin 0.3 - 1.2 mg/dL 0.3   0.3   Alkaline Phos 38 - 126 U/L 54   64   AST 15 - 41 U/L 23   25   ALT 0 - 44 U/L 23   28        Micro Results Recent Results (from the past 240 hour(s))  SARS Coronavirus 2 by RT PCR (hospital order, performed in [place] hospital lab) *cepheid single result test* Anterior Nasal Swab     Status: None   Collection Time: 02/01/22  6:23 PM   Specimen: Anterior Nasal Swab  Result Value Ref Range Status   SARS Coronavirus 2 by RT PCR NEGATIVE NEGATIVE Final    Comment: (NOTE) SARS-CoV-2 target nucleic acids are NOT DETECTED.  The SARS-CoV-2 RNA is generally detectable in upper and lower respiratory specimens during the acute phase of infection. The lowest concentration of SARS-CoV-2 viral copies this assay can detect is 250 copies / mL. A negative result does not preclude SARS-CoV-2 infection and should not be used as the sole basis for treatment or other patient management decisions.  A negative result may occur with improper specimen collection / handling, submission of specimen other than nasopharyngeal swab, presence of viral mutation(s) within the areas targeted by this assay, and inadequate number of viral copies (<250 copies / mL). A negative result must be combined with clinical observations, patient history, and epidemiological information.  Fact Sheet for Patients:   https://www.patel.info/  Fact Sheet for Healthcare Providers: https://hall.com/  This test is not yet approved or  cleared by the [place] FDA and has been authorized for detection and/or diagnosis of SARS-CoV-2 by FDA under an Emergency Use Authorization (EUA).  This EUA will remain in effect (meaning this test can be used) for the duration of the COVID-19 declaration under Section 564(b)(1) of the Act, 21 U.S.C. section 360bbb-3(b)(1), unless the authorization is terminated or revoked sooner.  Performed at  [facility], [address], [place], [place] [address]   Culture, blood (Routine X 2) w Reflex to ID Panel     Status: None (Preliminary result)   Collection Time: 02/02/22 12:24 AM   Specimen: BLOOD  Result Value Ref Range Status   Specimen Description BLOOD RIGHT THUMB  Final   Special Requests   Final    BOTTLES DRAWN AEROBIC AND ANAEROBIC Blood Culture adequate volume   Culture   Final    NO GROWTH < 12 HOURS Performed at [facility], [address] [place], [place] [address]    Report Status PENDING  Incomplete  Culture, blood (Routine X 2) w Reflex to ID Panel     Status: None (Preliminary result)   Collection Time: 02/02/22 12:24 AM   Specimen: BLOOD  Result Value Ref Range Status   Specimen Description BLOOD BLOOD LEFT FOREARM  Final   Special Requests   Final    BOTTLES DRAWN AEROBIC ONLY Blood Culture adequate volume   Culture   Final    NO GROWTH < 12 HOURS Performed at [facility], [address], [place],  [address]    Report Status PENDING  Incomplete    Radiology Reports US RENAL  Result Date: 02/02/2022 CLINICAL DATA:  RIGHT ureteral calculus with trace RIGHT hydronephrosis on CT, diabetes mellitus, hypertension EXAM: RENAL / URINARY TRACT ULTRASOUND COMPLETE COMPARISON:  CT abdomen and pelvis 02/01/2022 FINDINGS: Right Kidney: Renal measurements: 13.2 x 6.6 x 6.3 cm = volume: 287 mL. Normal cortical echogenicity. Diffuse cortical thinning. No mass or hydronephrosis. No shadowing calcification. Left Kidney: Renal measurements: 12.6 x 7.1 x 6.7 cm = volume: 317 mL. Normal cortical thickness and echogenicity. No mass, hydronephrosis or shadowing calcification. Bladder: Distended urinary bladder, volume 781 mL. Ureteral jets were not visualized during the interval of imaging. No gross bladder mass or wall thickening. Other: N/A IMPRESSION: Distended urinary bladder. No evidence of renal mass or hydronephrosis. Electronically Signed   By: Lavonia Dana M.D.   On:  02/02/2022 08:45   CT ABDOMEN PELVIS WO CONTRAST  Result Date: 02/01/2022 CLINICAL DATA:  Nausea and vomiting. EXAM: CT ABDOMEN AND PELVIS WITHOUT CONTRAST TECHNIQUE: Multidetector CT imaging of the abdomen and pelvis was performed following the standard protocol without IV contrast. RADIATION DOSE REDUCTION: This exam was performed according to the departmental dose-optimization program which includes automated exposure control, adjustment of the mA and/or kV according to patient size and/or use of iterative reconstruction technique. COMPARISON:  CT abdomen and pelvis 06/07/2019 FINDINGS: Lower chest: There is atelectasis in the lung bases. Hepatobiliary: Gallbladder surgically absent. No focal liver lesions. No biliary ductal dilatation. Pancreas: Unremarkable. No pancreatic ductal dilatation or surrounding inflammatory changes. Spleen: Normal in size without focal abnormality. Adrenals/Urinary Tract: There is likely a 5 mm distal right ureteral calculus seen on image 2/84. There is trace right-sided hydronephrosis. The bladder and adrenal glands are within normal limits. There is a 2.7 cm cyst in the left kidney. Stomach/Bowel: Small hiatal hernia is present, unchanged. Stomach is otherwise within normal limits. Appendix appears normal. No evidence of bowel wall thickening, distention, or inflammatory changes. There is sigmoid colon diverticulosis. Vascular/Lymphatic: No significant vascular findings are present. No enlarged abdominal or pelvic lymph nodes. Reproductive: Status post hysterectomy. No adnexal masses. Other: No abdominal wall hernia or abnormality. No abdominopelvic ascites. Musculoskeletal: Mild compression deformity of the superior endplate of L3 appears unchanged. No acute fractures are identified. IMPRESSION: 1. 5 mm calculus in the distal right ureter. There is minimal right-sided hydronephrosis. 2. Small hiatal hernia. 3. Sigmoid colon diverticulosis. 4. 2.7 cm left Bosniak 2 benign cyst.  No follow-up imaging is recommended. JACR 2018 Feb; [id number], Management of the Incidental RenalMass on [place], [employer] 2021; [phone],  Bosniak Classification of Cystic Renal Masses, Version 2019. Electronically Signed   By: Ronney Asters M.D.   On: 02/01/2022 20:43   CT Head Wo Contrast  Result Date: 02/01/2022 CLINICAL DATA:  Head trauma, minor (Age >= 65y) EXAM: CT HEAD WITHOUT CONTRAST TECHNIQUE: Contiguous axial images were obtained from the base of the skull through the vertex without intravenous contrast. RADIATION DOSE REDUCTION: This exam was performed according to the departmental dose-optimization program which includes automated exposure control, adjustment of the mA and/or kV according to patient size and/or use of iterative reconstruction technique. COMPARISON:  None Available. FINDINGS: Brain: There is no acute intracranial hemorrhage, mass effect, or edema. Gray-white differentiation is preserved. There is no extra-axial fluid collection. Ventricles and sulci are within normal limits in size and configuration. Vascular: There is atherosclerotic calcification at the skull base. Skull: Calvarium is unremarkable. Sinuses/Orbits: No acute finding. Other: None. IMPRESSION: No acute intracranial abnormality. Electronically Signed   By: Macy Mis M.D.   On: 02/01/2022 15:19    SIGNED: Deatra James, MD, FHM. [facility],  Pager (please use [email or web address] to page/text) Please use Epic Secure Chat for non-urgent communication (7AM-7PM)  If 7PM-7AM, please contact night-coverage [email or web address], 02/02/2022, 10:50 AM

## 2022-02-02 NOTE — Consult Note (Signed)
Urology Consult  Referring physician: Dr. Flossie Dibble Reason for referral: right ureteral calculus, sepsis  Chief Complaint: Nausea, right flank pain  History of Present Illness: Patricia [name] is a 70yo who presented to the ER this morning with nausea, vomiting, right flank pain. She underwent CT which showed a 5mm right distal ureteral calculus with mild hydronephrosis. She initially had nausea and vomiting which is improved with zofran. The right flank pain is sharp, intermittent, mild and nonraditing. WBC count 9.6, UA shows many bacteria. She denies any worsening LUTS. No recent fever. Creatinine 4.5  Past Medical History:  Diagnosis Date   Arthritis    Diabetes mellitus without complication (HCC)    Hypertension    Hypothyroidism    PONV (postoperative nausea and vomiting)    Past Surgical History:  Procedure Laterality Date   ABDOMINAL HYSTERECTOMY     CHOLECYSTECTOMY N/A 03/02/2015   Procedure: LAPAROSCOPIC CHOLECYSTECTOMY;  Surgeon: Franky Macho, MD;  Location: [facility];  Service: General;  Laterality: N/A;   HOLMIUM LASER APPLICATION Right 05/02/2019   Procedure: HOLMIUM LASER APPLICATION;  Surgeon: Marcine Matar, MD;  Location: [facility];  Service: Urology;  Laterality: Right;   IR URETERAL STENT RIGHT NEW ACCESS W/O SEP NEPHROSTOMY CATH  05/02/2019   KNEE ARTHROSCOPY Right    LIVER BIOPSY  03/02/2015   Procedure: LAPAROSCOPIC LIVER BIOPSY;  Surgeon: Franky Macho, MD;  Location: [facility];  Service: General;;   NEPHROLITHOTOMY Right 05/02/2019   Procedure: NEPHROLITHOTOMY PERCUTANEOUS;  Surgeon: Marcine Matar, MD;  Location: [facility];  Service: Urology;  Laterality: Right;  3 HRS   THYROIDECTOMY     TONSILLECTOMY  1970   TUBAL LIGATION      Medications: I have reviewed the patient's current medications. Allergies:  Allergies  Allergen Reactions   Amlodipine     Swelling   Keflex [Cephalexin] Rash    History reviewed. No pertinent family history. Social History:   reports that she has never smoked. She has never used smokeless tobacco. She reports that she does not drink alcohol and does not use drugs.  Review of Systems  Gastrointestinal:  Positive for nausea.  Genitourinary:  Positive for flank pain.  All other systems reviewed and are negative.   Physical Exam:  Vital signs in last 24 hours: Temp:  [97.7 F (36.5 C)-98.6 F (37 C)] 97.7 F (36.5 C) (08/16 1054) Pulse Rate:  [53-80] 80 (08/16 1054) Resp:  [12-20] 18 (08/16 1054) BP: (75-122)/(36-70) 122/57 (08/16 1054) SpO2:  [99 %-100 %] 100 % (08/16 1054) Weight:  [83.5 kg] 83.5 kg (08/15 1426) Physical Exam Vitals reviewed.  Constitutional:      Appearance: Normal appearance.  HENT:     Head: Normocephalic and atraumatic.     Nose: Nose normal. No congestion.     Mouth/Throat:     Mouth: Mucous membranes are dry.  Eyes:     Extraocular Movements: Extraocular movements intact.     Pupils: Pupils are equal, round, and reactive to light.  Cardiovascular:     Rate and Rhythm: Normal rate and regular rhythm.  Pulmonary:     Effort: Pulmonary effort is normal. No respiratory distress.  Abdominal:     General: Abdomen is flat. There is no distension.  Musculoskeletal:        General: No swelling. Normal range of motion.     Cervical back: Normal range of motion and neck supple.  Skin:    General: Skin is warm and dry.  Neurological:  General: No focal deficit present.     Mental Status: She is alert and oriented to person, place, and time.  Psychiatric:        Mood and Affect: Mood normal.        Behavior: Behavior normal.        Thought Content: Thought content normal.        Judgment: Judgment normal.     Laboratory Data:  Results for orders placed or performed during the hospital encounter of 02/01/22 (from the past 72 hour(s))  CBC with Differential     Status: Abnormal   Collection Time: 02/01/22  2:51 PM  Result Value Ref Range   WBC 13.1 (H) 4.0 - 10.5 K/uL    RBC 4.30 3.87 - 5.11 MIL/uL   Hemoglobin 12.8 12.0 - 15.0 g/dL   HCT 16.1 09.6 - 04.5 %   MCV 93.0 80.0 - 100.0 fL   MCH 29.8 26.0 - 34.0 pg   MCHC 32.0 30.0 - 36.0 g/dL   RDW 40.9 (H) 81.1 - 91.4 %   Platelets 367 150 - 400 K/uL   nRBC 0.0 0.0 - 0.2 %   Neutrophils Relative % 71 %   Neutro Abs 9.5 (H) 1.7 - 7.7 K/uL   Lymphocytes Relative 17 %   Lymphs Abs 2.2 0.7 - 4.0 K/uL   Monocytes Relative 9 %   Monocytes Absolute 1.1 (H) 0.1 - 1.0 K/uL   Eosinophils Relative 1 %   Eosinophils Absolute 0.1 0.0 - 0.5 K/uL   Basophils Relative 1 %   Basophils Absolute 0.1 0.0 - 0.1 K/uL   Immature Granulocytes 1 %   Abs Immature Granulocytes 0.09 (H) 0.00 - 0.07 K/uL    Comment: Performed at [facility], [address], [place], [place] [address]  Comprehensive metabolic panel     Status: Abnormal   Collection Time: 02/01/22  2:51 PM  Result Value Ref Range   Sodium 137 135 - 145 mmol/L   Potassium 6.0 (H) 3.5 - 5.1 mmol/L   Chloride 116 (H) 98 - 111 mmol/L   CO2 11 (L) 22 - 32 mmol/L   Glucose, Bld 134 (H) 70 - 99 mg/dL    Comment: Glucose reference range applies only to samples taken after fasting for at least 8 hours.   BUN 49 (H) 8 - 23 mg/dL   Creatinine, Ser 6.21 (H) 0.44 - 1.00 mg/dL   Calcium 9.8 8.9 - 30.8 mg/dL   Total Protein 7.3 6.5 - 8.1 g/dL   Albumin 4.0 3.5 - 5.0 g/dL   AST 25 15 - 41 U/L   ALT 28 0 - 44 U/L   Alkaline Phosphatase 64 38 - 126 U/L   Total Bilirubin 0.3 0.3 - 1.2 mg/dL   GFR, Estimated 8 (L) >60 mL/min    Comment: (NOTE) Calculated using the CKD-EPI Creatinine Equation (2021)    Anion gap 10 5 - 15    Comment: Performed at [facility], [address], [place], [place] [address]  Troponin I (High Sensitivity)     Status: None   Collection Time: 02/01/22  2:51 PM  Result Value Ref Range   Troponin I (High Sensitivity) 3 <18 ng/L    Comment: (NOTE) Elevated high sensitivity troponin I (hsTnI) values and significant  changes across serial  measurements may suggest ACS but many other  chronic and acute conditions are known to elevate hsTnI results.  Refer to the "Links" section for chest pain algorithms and additional  guidance.  Performed at [facility], [address], [place], [place] [address]   POC CBG, ED     Status: Abnormal   Collection Time: 02/01/22  2:54 PM  Result Value Ref Range   Glucose-Capillary 118 (H) 70 - 99 mg/dL    Comment: Glucose reference range applies only to samples taken after fasting for at least 8 hours.  Troponin I (High Sensitivity)     Status: None   Collection Time: 02/01/22  5:55 PM  Result Value Ref Range   Troponin I (High Sensitivity) 3 <18 ng/L    Comment: (NOTE) Elevated high sensitivity troponin I (hsTnI) values and significant  changes across serial measurements may suggest ACS but many other  chronic and acute conditions are known to elevate hsTnI results.  Refer to the "Links" section for chest pain algorithms and additional  guidance. Performed at [facility], [address] [place], [place] [address]   Procalcitonin - Baseline     Status: None   Collection Time: 02/01/22  5:55 PM  Result Value Ref Range   Procalcitonin 0.10 ng/mL    Comment:        Interpretation: PCT (Procalcitonin) <= 0.5 ng/mL: Systemic infection (sepsis) is not likely. Local bacterial infection is possible. (NOTE)       Sepsis PCT Algorithm           Lower Respiratory Tract                                      Infection PCT Algorithm    ----------------------------     ----------------------------         PCT < 0.25 ng/mL                PCT < 0.10 ng/mL          Strongly encourage             Strongly discourage   discontinuation of antibiotics    initiation of antibiotics    ----------------------------     -----------------------------       PCT 0.25 - 0.50 ng/mL            PCT 0.10 - 0.25 ng/mL               OR       >80% decrease in PCT            Discourage initiation of                                             antibiotics      Encourage discontinuation           of antibiotics    ----------------------------     -----------------------------         PCT >= 0.50 ng/mL              PCT 0.26 - 0.50 ng/mL               AND        <80% decrease in PCT             Encourage initiation of  antibiotics       Encourage continuation           of antibiotics    ----------------------------     -----------------------------        PCT >= 0.50 ng/mL                  PCT > 0.50 ng/mL               AND         increase in PCT                  Strongly encourage                                      initiation of antibiotics    Strongly encourage escalation           of antibiotics                                     -----------------------------                                           PCT <= 0.25 ng/mL                                                 OR                                        > 80% decrease in PCT                                      Discontinue / Do not initiate                                             antibiotics  Performed at [facility], [address] [place], [place] [address]   SARS Coronavirus 2 by RT PCR (hospital order, performed in [facility] hospital lab) *cepheid single result test* Anterior Nasal Swab     Status: None   Collection Time: 02/01/22  6:23 PM   Specimen: Anterior Nasal Swab  Result Value Ref Range   SARS Coronavirus 2 by RT PCR NEGATIVE NEGATIVE    Comment: (NOTE) SARS-CoV-2 target nucleic acids are NOT DETECTED.  The SARS-CoV-2 RNA is generally detectable in upper and lower respiratory specimens during the acute phase of infection. The lowest concentration of SARS-CoV-2 viral copies this assay can detect is 250 copies / mL. A negative result does not preclude SARS-CoV-2 infection and should not be used as the sole basis for treatment or other patient management decisions.  A negative  result may occur with improper specimen collection / handling, submission of specimen other than nasopharyngeal swab, presence of viral mutation(s) within the areas targeted by this assay,  and inadequate number of viral copies (<250 copies / mL). A negative result must be combined with clinical observations, patient history, and epidemiological information.  Fact Sheet for Patients:   RoadLapTop.co.zahttps://www.fda.gov/media/158405/download  Fact Sheet for Healthcare Providers: http://kim-miller.com/https://www.fda.gov/media/158404/download  This test is not yet approved or  cleared by the [place] FDA and has been authorized for detection and/or diagnosis of SARS-CoV-2 by FDA under an Emergency Use Authorization (EUA).  This EUA will remain in effect (meaning this test can be used) for the duration of the COVID-19 declaration under Section 564(b)(1) of the Act, 21 U.S.C. section 360bbb-3(b)(1), unless the authorization is terminated or revoked sooner.  Performed at [facility], [address] [place], [place] [address]   Urinalysis, Routine w reflex microscopic Urine, Clean Catch     Status: Abnormal   Collection Time: 02/01/22  9:40 PM  Result Value Ref Range   Color, Urine YELLOW YELLOW   APPearance CLOUDY (A) CLEAR   Specific Gravity, Urine 1.013 1.005 - 1.030   pH 5.0 5.0 - 8.0   Glucose, UA NEGATIVE NEGATIVE mg/dL   Hgb urine dipstick NEGATIVE NEGATIVE   Bilirubin Urine NEGATIVE NEGATIVE   Ketones, ur NEGATIVE NEGATIVE mg/dL   Protein, ur 528100 (A) NEGATIVE mg/dL   Nitrite NEGATIVE NEGATIVE   Leukocytes,Ua MODERATE (A) NEGATIVE   RBC / HPF 6-10 0 - 5 RBC/hpf   WBC, UA 21-50 0 - 5 WBC/hpf   Bacteria, UA MANY (A) NONE SEEN   Squamous Epithelial / LPF 0-5 0 - 5   WBC Clumps PRESENT    Mucus PRESENT    Hyaline Casts, UA PRESENT     Comment: Performed at [facility], [address] [place], [place] [address]  Lactic acid, plasma     Status: None   Collection Time: 02/01/22 11:00 PM  Result Value Ref  Range   Lactic Acid, Venous 1.4 0.5 - 1.9 mmol/L    Comment: Performed at [facility], [address] [place], [place] [address]  Basic metabolic panel     Status: Abnormal   Collection Time: 02/01/22 11:00 PM  Result Value Ref Range   Sodium 136 135 - 145 mmol/L   Potassium 5.4 (H) 3.5 - 5.1 mmol/L   Chloride 117 (H) 98 - 111 mmol/L   CO2 12 (L) 22 - 32 mmol/L   Glucose, Bld 123 (H) 70 - 99 mg/dL    Comment: Glucose reference range applies only to samples taken after fasting for at least 8 hours.   BUN 50 (H) 8 - 23 mg/dL   Creatinine, Ser 7.254.84 (H) 0.44 - 1.00 mg/dL   Calcium 8.7 (L) 8.9 - 10.3 mg/dL   GFR, Estimated 9 (L) >60 mL/min    Comment: (NOTE) Calculated using the CKD-EPI Creatinine Equation (2021)    Anion gap 7 5 - 15    Comment: Performed at [facility], [address] [place], [place] [address]  HIV Antibody (routine testing w rflx)     Status: None   Collection Time: 02/01/22 11:00 PM  Result Value Ref Range   HIV Screen 4th Generation wRfx Non Reactive Non Reactive    Comment: Performed at [facility], [address] [place], [place] [address]  Culture, blood (Routine X 2) w Reflex to ID Panel     Status: None (Preliminary result)   Collection Time: 02/02/22 12:24 AM   Specimen: BLOOD  Result Value Ref Range   Specimen Description BLOOD RIGHT THUMB    Special  Requests      BOTTLES DRAWN AEROBIC AND ANAEROBIC Blood Culture adequate volume   Culture      NO GROWTH < 12 HOURS Performed at [facility], [address] [place], [place] [address]    Report Status PENDING   Culture, blood (Routine X 2) w Reflex to ID Panel     Status: None (Preliminary result)   Collection Time: 02/02/22 12:24 AM   Specimen: BLOOD  Result Value Ref Range   Specimen Description BLOOD BLOOD LEFT FOREARM    Special Requests      BOTTLES DRAWN AEROBIC ONLY Blood Culture adequate volume   Culture      NO GROWTH < 12 HOURS Performed at [facility], [address],  [place], [place] [address]    Report Status PENDING   Cortisol-am, blood     Status: None   Collection Time: 02/02/22 12:24 AM  Result Value Ref Range   Cortisol - AM 9.5 6.7 - 22.6 ug/dL    Comment: Performed at [facility], [address], [place], [place] [address]  Comprehensive metabolic panel     Status: Abnormal   Collection Time: 02/02/22 12:24 AM  Result Value Ref Range   Sodium 137 135 - 145 mmol/L   Potassium 5.2 (H) 3.5 - 5.1 mmol/L   Chloride 117 (H) 98 - 111 mmol/L   CO2 14 (L) 22 - 32 mmol/L   Glucose, Bld 102 (H) 70 - 99 mg/dL    Comment: Glucose reference range applies only to samples taken after fasting for at least 8 hours.   BUN 48 (H) 8 - 23 mg/dL   Creatinine, Ser 2.13 (H) 0.44 - 1.00 mg/dL   Calcium 8.9 8.9 - 08.6 mg/dL   Total Protein 6.3 (L) 6.5 - 8.1 g/dL   Albumin 3.4 (L) 3.5 - 5.0 g/dL   AST 23 15 - 41 U/L   ALT 23 0 - 44 U/L   Alkaline Phosphatase 54 38 - 126 U/L   Total Bilirubin 0.3 0.3 - 1.2 mg/dL   GFR, Estimated 10 (L) >60 mL/min    Comment: (NOTE) Calculated using the CKD-EPI Creatinine Equation (2021)    Anion gap 6 5 - 15    Comment: Performed at [facility], [address], [place], [place] [address]  Magnesium     Status: None   Collection Time: 02/02/22 12:24 AM  Result Value Ref Range   Magnesium 1.8 1.7 - 2.4 mg/dL    Comment: Performed at [facility], [address], [place], [place] [address]  CBC with Differential/Platelet     Status: Abnormal   Collection Time: 02/02/22 12:24 AM  Result Value Ref Range   WBC 9.6 4.0 - 10.5 K/uL   RBC 4.01 3.87 - 5.11 MIL/uL   Hemoglobin 11.8 (L) 12.0 - 15.0 g/dL   HCT 28.4 13.2 - 44.0 %   MCV 96.8 80.0 - 100.0 fL   MCH 29.4 26.0 - 34.0 pg   MCHC 30.4 30.0 - 36.0 g/dL   RDW 10.2 (H) 72.5 - 36.6 %   Platelets 255 150 - 400 K/uL   nRBC 0.0 0.0 - 0.2 %   Neutrophils Relative % 53 %   Neutro Abs 5.2 1.7 - 7.7 K/uL   Lymphocytes Relative 34 %   Lymphs Abs 3.2 0.7 - 4.0 K/uL   Monocytes  Relative 10 %   Monocytes Absolute 1.0 0.1 - 1.0 K/uL   Eosinophils Relative 1 %   Eosinophils  Absolute 0.1 0.0 - 0.5 K/uL   Basophils Relative 1 %   Basophils Absolute 0.1 0.0 - 0.1 K/uL   Immature Granulocytes 1 %   Abs Immature Granulocytes 0.06 0.00 - 0.07 K/uL    Comment: Performed at [facility], [address] [place], [place] [address]  TSH     Status: None   Collection Time: 02/02/22 12:24 AM  Result Value Ref Range   TSH 0.461 0.350 - 4.500 uIU/mL    Comment: Performed by a 3rd Generation assay with a functional sensitivity of <=0.01 uIU/mL. Performed at [facility], [address], [place], [place] [address]   Procalcitonin     Status: None   Collection Time: 02/02/22  3:39 AM  Result Value Ref Range   Procalcitonin 0.12 ng/mL    Comment:        Interpretation: PCT (Procalcitonin) <= 0.5 ng/mL: Systemic infection (sepsis) is not likely. Local bacterial infection is possible. (NOTE)       Sepsis PCT Algorithm           Lower Respiratory Tract                                      Infection PCT Algorithm    ----------------------------     ----------------------------         PCT < 0.25 ng/mL                PCT < 0.10 ng/mL          Strongly encourage             Strongly discourage   discontinuation of antibiotics    initiation of antibiotics    ----------------------------     -----------------------------       PCT 0.25 - 0.50 ng/mL            PCT 0.10 - 0.25 ng/mL               OR       >80% decrease in PCT            Discourage initiation of                                            antibiotics      Encourage discontinuation           of antibiotics    ----------------------------     -----------------------------         PCT >= 0.50 ng/mL              PCT 0.26 - 0.50 ng/mL               AND        <80% decrease in PCT             Encourage initiation of                                             antibiotics       Encourage continuation           of  antibiotics    ----------------------------     -----------------------------  PCT >= 0.50 ng/mL                  PCT > 0.50 ng/mL               AND         increase in PCT                  Strongly encourage                                      initiation of antibiotics    Strongly encourage escalation           of antibiotics                                     -----------------------------                                           PCT <= 0.25 ng/mL                                                 OR                                        > 80% decrease in PCT                                      Discontinue / Do not initiate                                             antibiotics  Performed at [facility], [address] [place], [place] [address]   Protime-INR     Status: None   Collection Time: 02/02/22  5:23 AM  Result Value Ref Range   Prothrombin Time 14.3 11.4 - 15.2 seconds   INR 1.1 0.8 - 1.2    Comment: (NOTE) INR goal varies based on device and disease states. Performed at [facility], [address] [place], [place] [address]   Glucose, capillary     Status: None   Collection Time: 02/02/22 11:06 AM  Result Value Ref Range   Glucose-Capillary 82 70 - 99 mg/dL    Comment: Glucose reference range applies only to samples taken after fasting for at least 8 hours.  Glucose, capillary     Status: Abnormal   Collection Time: 02/02/22 11:38 AM  Result Value Ref Range   Glucose-Capillary 114 (H) 70 - 99 mg/dL    Comment: Glucose reference range applies only to samples taken after fasting for at least 8 hours.   Recent Results (from the past 240 hour(s))  SARS Coronavirus 2 by RT PCR (hospital order, performed in [facility] hospital lab) *cepheid single result test* Anterior Nasal Swab     Status:  None   Collection Time: 02/01/22  6:23 PM   Specimen: Anterior Nasal Swab  Result Value Ref Range Status   SARS Coronavirus 2 by RT PCR NEGATIVE NEGATIVE Final     Comment: (NOTE) SARS-CoV-2 target nucleic acids are NOT DETECTED.  The SARS-CoV-2 RNA is generally detectable in upper and lower respiratory specimens during the acute phase of infection. The lowest concentration of SARS-CoV-2 viral copies this assay can detect is 250 copies / mL. A negative result does not preclude SARS-CoV-2 infection and should not be used as the sole basis for treatment or other patient management decisions.  A negative result may occur with improper specimen collection / handling, submission of specimen other than nasopharyngeal swab, presence of viral mutation(s) within the areas targeted by this assay, and inadequate number of viral copies (<250 copies / mL). A negative result must be combined with clinical observations, patient history, and epidemiological information.  Fact Sheet for Patients:   RoadLapTop.co.za  Fact Sheet for Healthcare Providers: http://kim-miller.com/  This test is not yet approved or  cleared by the [place] FDA and has been authorized for detection and/or diagnosis of SARS-CoV-2 by FDA under an Emergency Use Authorization (EUA).  This EUA will remain in effect (meaning this test can be used) for the duration of the COVID-19 declaration under Section 564(b)(1) of the Act, 21 U.S.C. section 360bbb-3(b)(1), unless the authorization is terminated or revoked sooner.  Performed at [facility], [address] [place], [place] [address]   Culture, blood (Routine X 2) w Reflex to ID Panel     Status: None (Preliminary result)   Collection Time: 02/02/22 12:24 AM   Specimen: BLOOD  Result Value Ref Range Status   Specimen Description BLOOD RIGHT THUMB  Final   Special Requests   Final    BOTTLES DRAWN AEROBIC AND ANAEROBIC Blood Culture adequate volume   Culture   Final    NO GROWTH < 12 HOURS Performed at [facility], [address], [place], [place] [address]    Report Status PENDING   Incomplete  Culture, blood (Routine X 2) w Reflex to ID Panel     Status: None (Preliminary result)   Collection Time: 02/02/22 12:24 AM   Specimen: BLOOD  Result Value Ref Range Status   Specimen Description BLOOD BLOOD LEFT FOREARM  Final   Special Requests   Final    BOTTLES DRAWN AEROBIC ONLY Blood Culture adequate volume   Culture   Final    NO GROWTH < 12 HOURS Performed at [facility], [address] [place], [place] [address]    Report Status PENDING  Incomplete   Creatinine: Recent Labs    01/30/22 1215 02/01/22 1451 02/01/22 2300 02/02/22 0024  CREATININE 1.56* 5.33* 4.84* 4.54*   Baseline Creatinine: 0.9  Impression/Assessment:  70yo with right ureteral calculus, UTI and ARF  Plan:  -We discussed the management of kidney stones. These options include observation, ureteroscopy, shockwave lithotripsy ([facility]) and percutaneous nephrolithotomy ([facility]). We discussed which options are relevant to the patient's stone(s). We discussed the natural history of kidney stones as well as the complications of untreated stones and the impact on quality of life without treatment as well as with each of the above listed treatments. We also discussed the efficacy of each treatment in its ability to clear the stone burden. With any of these management options I discussed the signs and symptoms of infection and the need for emergent treatment should these be experienced. For each option  we discussed the ability of each procedure to clear the patient of their stone burden.   For observation I described the risks which include but are not limited to silent renal damage, life-threatening infection, need for emergent surgery, failure to pass stone and pain.   For ureteroscopy I described the risks which include bleeding, infection, damage to contiguous structures, positioning injury, ureteral stricture, ureteral avulsion, ureteral injury, need for prolonged ureteral stent, inability to perform  ureteroscopy, need for an interval procedure, inability to clear stone burden, stent discomfort/pain, heart attack, stroke, pulmonary embolus and the inherent risks with general anesthesia.   For shockwave lithotripsy I described the risks which include arrhythmia, kidney contusion, kidney hemorrhage, need for transfusion, pain, inability to adequately break up stone, inability to pass stone fragments, Steinstrasse, infection associated with obstructing stones, need for alternate surgical procedure, need for repeat shockwave lithotripsy, MI, CVA, PE and the inherent risks with anesthesia/conscious sedation.   For PCNL I described the risks including positioning injury, pneumothorax, hydrothorax, need for chest tube, inability to clear stone burden, renal laceration, arterial venous fistula or malformation, need for embolization of kidney, loss of kidney or renal function, need for repeat procedure, need for prolonged nephrostomy tube, ureteral avulsion, MI, CVA, PE and the inherent risks of general anesthesia.   - The patient would like to proceed with right ureteral stent placement  Wilkie Aye 02/02/2022, 12:11 PM

## 2022-02-02 NOTE — Transfer of Care (Signed)
Immediate Anesthesia Transfer of Care Note  Patient: Patricia [name]  Procedure(s) Performed: CYSTOSCOPY WITH RETROGRADE PYELOGRAM (Bilateral: Ureter) STENT PLACEMENT (Bilateral: Ureter)  Patient Location: PACU  Anesthesia Type:MAC  Level of Consciousness: sedated and patient cooperative  Airway & Oxygen Therapy: Patient Spontanous Breathing and Patient connected to nasal cannula oxygen  Post-op Assessment: Report given to RN, Post -op Vital signs reviewed and stable and Patient moving all extremities  Post vital signs: Reviewed and stable  Last Vitals:  Vitals Value Taken Time  BP 109/53 02/02/22 1308  Temp 36.7 C 02/02/22 1308  Pulse 80   Resp 15 02/02/22 1310  SpO2 98   Vitals shown include unvalidated device data.  Last Pain:  Vitals:   02/02/22 1054  TempSrc: Oral  PainSc: 0-No pain      Patients Stated Pain Goal: 4 (77/11/65 7903)  Complications: No notable events documented.

## 2022-02-02 NOTE — Assessment & Plan Note (Addendum)
-  Resolved -Much improved sepsis physiology, afebrile, normotensive, improved leukocytosis, improved kidney function   Met sepsis criteria on admission-source of infection UTI, obstructive -  WBC 13.1, RR 20, BP 75/36, AKI, UTI -Cephalosporin allergy -Recently on Cipro outpatient -Continue IV antibiotics of Zosyn -Urine culture >>> no growth to date -Blood culture >>> no growth to date -Procalcitonin 0.12  -S/p IV hydration, IV antibiotics, c -IV antibiotics Zosyn discontinued 02/05/2022 switch to p.o. Bactrim double strength for 5 more days

## 2022-02-02 NOTE — Progress Notes (Signed)
  Transition of Care (TOC) Screening Note   Patient Details  Name: Patricia [name] Date of Birth: [birth year]   Transition of Care [facility]) CM/SW Contact:    Villa Herb, LCSWA Phone Number: 02/02/2022, 11:23 AM    Transition of Care Department [facility]) has reviewed patient and no TOC needs have been identified at this time. We will continue to monitor patient advancement through interdisciplinary progression rounds. If new patient transition needs arise, please place a TOC consult.

## 2022-02-02 NOTE — Op Note (Signed)
Preoperative diagnosis: right ureteral calculus, acute renal failure, concern for sepsis  Postoperative diagnosis: Same  Procedure: 1 cystoscopy 2. Bilateral retrograde pyelography 3.  Intraoperative fluoroscopy, under one hour, with interpretation 4.  bilateral 6 x 26 JJ stent placement  Attending: Cleda Mccreedy  Anesthesia: General  Estimated blood loss: None  Drains: bilateral 6 x 26 JJ ureteral stent without tether 16 french foley  Specimens: none  Antibiotics: zosyn  Findings: Right distal ureteral calculus. Mild bilateral hydronephrosis. No masses/lesions in the bladder. Ureteral orifices in normal anatomic location.  Indications: Patient is a 71 year old female with a history of right ureteral stone and elevated creatinine. After discussing treatment options, they decided proceed with bilateral stent placement.  Procedure in detail: The patient was brought to the operating room and a brief timeout was done to ensure correct patient, correct procedure, correct site.  General anesthesia was administered patient was placed in dorsal lithotomy position.  Her genitalia was then prepped and draped in usual sterile fashion.  A rigid 22 French cystoscope was passed in the urethra and the bladder.  Bladder was inspected free masses or lesions.  the ureteral orifices were in the normal orthotopic locations. a 6 french ureteral catheter was then instilled into the left ureteral orifice.  a gentle retrograde was obtained and findings noted above. We then advanced a zipwire up to the renal pelvis. We then placed a 6 x 26 double-j ureteral stent over the zip wire. We then removed the wire and good coil was noted in the the renal pelvis under fluoroscopy and the bladder under direct vision.  We then turned out attention to the right side.  a gentle retrograde was obtained and findings noted above. We then advanced a zipwire up to the renal pelvis. we then placed a 6 x 26 double-j ureteral stent  over the original zip wire.  We then removed the wire and good coil was noted in the the renal pelvis under fluoroscopy and the bladder under direct vision.  A 16 french foley was placed, the bladder was then drained and this concluded the procedure which was well tolerated by patient.  Complications: None  Condition: Stable, extubated, transferred to PACU  Plan: Patient is to be admitted for sepsis management and renal failure management.   follow-up in 2 weeks for stone extraction.

## 2022-02-02 NOTE — Anesthesia Postprocedure Evaluation (Signed)
Anesthesia Post Note  Patient: Patricia [name]  Procedure(s) Performed: CYSTOSCOPY WITH RETROGRADE PYELOGRAM (Bilateral: Ureter) STENT PLACEMENT (Bilateral: Ureter)  Patient location during evaluation: PACU Anesthesia Type: General Level of consciousness: awake and alert and oriented Pain management: pain level controlled Vital Signs Assessment: post-procedure vital signs reviewed and stable Respiratory status: spontaneous breathing, nonlabored ventilation and respiratory function stable Cardiovascular status: blood pressure returned to baseline and stable Postop Assessment: no apparent nausea or vomiting Anesthetic complications: no   No notable events documented.   Last Vitals:  Vitals:   02/02/22 1357 02/02/22 1400  BP:  (!) 116/56  Pulse: 71   Resp: 14 12  Temp:  36.9 C  SpO2:      Last Pain:  Vitals:   02/02/22 1330  TempSrc:   PainSc: 0-No pain                 Jen Benedict C Toriano Aikey

## 2022-02-02 NOTE — Assessment & Plan Note (Addendum)
-  Due to obstructive uropathy, status post bilateral stent placement 02/02/2022 -Improving BUN/creatinine -On admission creatinine up to 5.33 from 1.5 >>> 4.54 >> 2.61 >>>1.47 Lab Results  Component Value Date   CREATININE 1.47 (H) 02/05/2022   CREATININE 1.66 (H) 02/04/2022   CREATININE 2.61 (H) 02/03/2022    -Nephrology consulted, appreciate input  -Associated hyperkalemia and decreased bicarb down to 11 -CT abdomen shows obstructing calculus in the distal right ureter with right-sided hydronephrosis -Discussed the with Dr. Ronne Binning   -Status post aggressive IV fluid resuscitation -Discontinued home medication of Benicar for now -Discontinued and avoid NSAIDs

## 2022-02-02 NOTE — H&P (Signed)
History and Physical    Patient: Patricia [name] GEZ:[id number] DOB: [birth year] DOA: 02/01/2022 DOS: the patient was seen and examined on 02/02/2022 PCP: Assunta Found, MD  Patient coming from: Home  Chief Complaint:  Chief Complaint  Patient presents with   Hypotension   HPI: Patricia [name] is a 71 y.o. female with medical history significant of diabetes mellitus type 2, hypertension, hypothyroidism, and more presents the ED with a chief complaint of hypotension.  Unfortunately patient is not able to provide much history.  She knows herself and where she is at, but when asked the year she is peds her full name.  She reports she had been "down there."  When you try to clarify exact location, she reports she is not really sure.  She is also unable to characterize the pain due to not being "really sure." Patient was hypotensive at presentation.  She was given 2 L bolus, and admission was requested for dehydration.  With her significant AKI with a creatinine up to 5.33, Kelly recommended CT abdomen pelvis.  It did show an obstructing stone.  UA also was indicative of UTI.  Zosyn was started at admission.  Urology was consulted and recommends admission to [facility] for stent placement. Review of Systems: unable to review all systems due to the inability of the patient to answer questions. Past Medical History:  Diagnosis Date   Arthritis    Diabetes mellitus without complication (HCC)    Hypertension    Hypothyroidism    PONV (postoperative nausea and vomiting)    Past Surgical History:  Procedure Laterality Date   ABDOMINAL HYSTERECTOMY     CHOLECYSTECTOMY N/A 03/02/2015   Procedure: LAPAROSCOPIC CHOLECYSTECTOMY;  Surgeon: Franky Macho, MD;  Location: [facility];  Service: General;  Laterality: N/A;   HOLMIUM LASER APPLICATION Right 05/02/2019   Procedure: HOLMIUM LASER APPLICATION;  Surgeon: Marcine Matar, MD;  Location: [facility];  Service: Urology;  Laterality: Right;   IR URETERAL  STENT RIGHT NEW ACCESS W/O SEP NEPHROSTOMY CATH  05/02/2019   KNEE ARTHROSCOPY Right    LIVER BIOPSY  03/02/2015   Procedure: LAPAROSCOPIC LIVER BIOPSY;  Surgeon: Franky Macho, MD;  Location: [facility];  Service: General;;   NEPHROLITHOTOMY Right 05/02/2019   Procedure: NEPHROLITHOTOMY PERCUTANEOUS;  Surgeon: Marcine Matar, MD;  Location: [facility];  Service: Urology;  Laterality: Right;  3 HRS   THYROIDECTOMY     TONSILLECTOMY  1970   TUBAL LIGATION     Social History:  reports that she has never smoked. She has never used smokeless tobacco. She reports that she does not drink alcohol and does not use drugs.  Allergies  Allergen Reactions   Amlodipine     Swelling   Keflex [Cephalexin] Rash    No family history on file.  Prior to Admission medications   Medication Sig Start Date End Date Taking? Authorizing Provider  APPLE CIDER VINEGAR PO Take 1 tablet by mouth daily.   Yes [provider]  cetirizine (ZYRTEC) 10 MG tablet Take 10 mg by mouth daily.   Yes [provider]  CINNAMON PO Take 1,000 mg by mouth daily.   Yes [provider]  ciprofloxacin (CIPRO) 500 MG tablet Take 1 tablet (500 mg total) by mouth every 12 (twelve) hours. 01/30/22  Yes Lonell Grandchild, MD  cyclobenzaprine (FLEXERIL) 10 MG tablet Take 10 mg by mouth 3 (three) times daily as needed for muscle spasms.   Yes [provider]  DILT-XR 180 MG  24 hr capsule Take 180 mg by mouth daily. 08/18/21   [provider]  escitalopram (LEXAPRO) 5 MG tablet Take 5 mg by mouth daily. 07/17/19   [provider]  levothyroxine (SYNTHROID) 150 MCG tablet Take 150 mcg by mouth daily. 07/05/19   [provider]  levothyroxine (SYNTHROID) 175 MCG tablet Take 175 mcg by mouth daily. 11/10/21   [provider]  meclizine (ANTIVERT) 25 MG tablet Take 25 mg by mouth daily as needed for nausea. 11/12/20   [provider]  meloxicam (MOBIC) 15 MG tablet Take 15  mg by mouth as needed for pain.    [provider]  metFORMIN (GLUCOPHAGE) 1000 MG tablet Take 1,000 mg by mouth 2 (two) times daily. 11/25/19   [provider]  metFORMIN (GLUCOPHAGE) 500 MG tablet Take 500 mg by mouth 2 (two) times daily. 11/08/21   [provider]  olmesartan (BENICAR) 40 MG tablet Take 40 mg by mouth daily. 04/05/19   [provider]  Omega-3 Fatty Acids (FISH OIL) 1000 MG CAPS Take 1 capsule by mouth daily.    [provider]  ondansetron (ZOFRAN) 4 MG tablet Take 1 tablet (4 mg total) by mouth every 6 (six) hours. 01/30/22   Lonell Grandchild, MD  pregabalin (LYRICA) 200 MG capsule Take 200 mg by mouth 2 (two) times daily. 01/18/22   [provider]  simvastatin (ZOCOR) 20 MG tablet Take 20 mg by mouth daily.    [provider]  solifenacin (VESICARE) 10 MG tablet Take 1 tablet (10 mg total) by mouth daily. 02/01/22   Marcine Matar, MD  TIADYLT ER 360 MG 24 hr capsule Take 360 mg by mouth daily. 11/28/21   [provider]    Physical Exam: Vitals:   02/01/22 1846 02/01/22 2130 02/02/22 0030 02/02/22 0048  BP:  113/60 (!) 111/58   Pulse:  72 65   Resp:  19 14   Temp: 98 F (36.7 C)   98.6 F (37 C)  TempSrc:    Oral  SpO2:  100% 100%   Weight:      Height:       1.  General: Patient lying supine in bed,  no acute distress   2. Psychiatric: Alert and oriented x 3, mood and behavior normal for situation, pleasant and cooperative with exam   3. Neurologic: Speech and language are normal, face is symmetric, moves all 4 extremities voluntarily, at baseline without acute deficits on limited exam   4. HEENMT:  Head is atraumatic, normocephalic, pupils reactive to light, neck is supple, trachea is midline, mucous membranes are moist   5. Respiratory : Lungs are clear to auscultation bilaterally without wheezing, rhonchi, rales, no cyanosis, no increase in work of breathing or accessory muscle  use   6. Cardiovascular : Heart rate normal, rhythm is regular, no murmurs, rubs or gallops, no peripheral edema, peripheral pulses palpated   7. Gastrointestinal:  Abdomen is soft, nondistended, nontender to palpation bowel sounds active, no masses or organomegaly palpated   8. Skin:  Skin is warm, dry and intact without rashes, acute lesions, or ulcers on limited exam   9.Musculoskeletal:  No acute deformities or trauma, no asymmetry in tone, no peripheral edema, peripheral pulses palpated, no tenderness to palpation in the extremities  Data Reviewed: In the ED Temp 98, heart rate 53-77, respiratory rate 12-20, blood pressure 75/30 6-30 13/60, satting at 4 L nasal cannula Leukocytosis 13.1, hemoglobin 12.8 BUN 49, creatinine  5.33 UA is indicative of UTI CT abdomen pelvis shows 5 mm calculus in the right distal ureter with right hydronephrosis Admission requested for stent placement to [facility]  Assessment and Plan: * Sepsis (HCC) - White blood cell count 13.1, respiratory rate 20, blood pressure 75/36, AKI, UTI -Cephalosporin allergy -Recently on Cipro outpatient -Start Zosyn -Urine culture pending -Blood culture pending -Procalcitonin pending -Admit to [facility] for stent placement to the right kidney can be drained  Essential hypertension - Continue diltiazem, hold olmesartan due to AKI  Hypothyroidism - Continue Synthroid  UTI (urinary tract infection) Started on Cipro outpatient Cephalosporin allergy Continue Zosyn  Hyperkalemia - Secondary to AKI -Calcium gluconate, insulin, 2 L normal saline, Lokelma given in the ED -Potassium has improved from 6.0-5.3 -Monitor on telemetry -Trend with a.m. labs  AKI (acute kidney injury) (HCC) - Creatinine up to 5.33 from 1.5 -Associated hyperkalemia and decreased bicarb down to 11 -CT abdomen shows obstructing calculus in the distal right ureter with right-sided hydronephrosis -Urology consulted and recommends  admission to [facility] for stent placement -Patient was given 2 L bolus -Continue IV hydration -Hold NSAIDs -Continue to monitor  Calculus of kidney - Located in the distal right ureter -Right-sided hydronephrosis -Admit to [facility] for stent placement      Advance Care Planning:   Code Status: Full Code   Consults: Urology  Family Communication: No family at bedside.  At a more appropriate hour will be necessary to contact family to find out patient's baseline mentation.  Severity of Illness: The appropriate patient status for this patient is INPATIENT. Inpatient status is judged to be reasonable and necessary in order to provide the required intensity of service to ensure the patient's safety. The patient's presenting symptoms, physical exam findings, and initial radiographic and laboratory data in the context of their chronic comorbidities is felt to place them at high risk for further clinical deterioration. Furthermore, it is not anticipated that the patient will be medically stable for discharge from the hospital within 2 midnights of admission.   * I certify that at the point of admission it is my clinical judgment that the patient will require inpatient hospital care spanning beyond 2 midnights from the point of admission due to high intensity of service, high risk for further deterioration and high frequency of surveillance required.*  Author: Lilyan Gilford, DO 02/02/2022 2:46 AM  For on call review [email or web address].

## 2022-02-03 DIAGNOSIS — A419 Sepsis, unspecified organism: Secondary | ICD-10-CM | POA: Diagnosis not present

## 2022-02-03 DIAGNOSIS — N200 Calculus of kidney: Secondary | ICD-10-CM | POA: Diagnosis not present

## 2022-02-03 DIAGNOSIS — N179 Acute kidney failure, unspecified: Secondary | ICD-10-CM | POA: Diagnosis not present

## 2022-02-03 DIAGNOSIS — I10 Essential (primary) hypertension: Secondary | ICD-10-CM | POA: Diagnosis not present

## 2022-02-03 LAB — CBC
HCT: 32.9 % — ABNORMAL LOW (ref 36.0–46.0)
Hemoglobin: 10.6 g/dL — ABNORMAL LOW (ref 12.0–15.0)
MCH: 29.7 pg (ref 26.0–34.0)
MCHC: 32.2 g/dL (ref 30.0–36.0)
MCV: 92.2 fL (ref 80.0–100.0)
Platelets: 216 10*3/uL (ref 150–400)
RBC: 3.57 MIL/uL — ABNORMAL LOW (ref 3.87–5.11)
RDW: 15.2 % (ref 11.5–15.5)
WBC: 8.1 10*3/uL (ref 4.0–10.5)
nRBC: 0 % (ref 0.0–0.2)

## 2022-02-03 LAB — COMPREHENSIVE METABOLIC PANEL
ALT: 34 U/L (ref 0–44)
AST: 62 U/L — ABNORMAL HIGH (ref 15–41)
Albumin: 2.9 g/dL — ABNORMAL LOW (ref 3.5–5.0)
Alkaline Phosphatase: 46 U/L (ref 38–126)
Anion gap: 3 — ABNORMAL LOW (ref 5–15)
BUN: 42 mg/dL — ABNORMAL HIGH (ref 8–23)
CO2: 15 mmol/L — ABNORMAL LOW (ref 22–32)
Calcium: 8.4 mg/dL — ABNORMAL LOW (ref 8.9–10.3)
Chloride: 121 mmol/L — ABNORMAL HIGH (ref 98–111)
Creatinine, Ser: 2.61 mg/dL — ABNORMAL HIGH (ref 0.44–1.00)
GFR, Estimated: 19 mL/min — ABNORMAL LOW (ref >60)
Glucose, Bld: 117 mg/dL — ABNORMAL HIGH (ref 70–99)
Potassium: 4.7 mmol/L (ref 3.5–5.1)
Sodium: 139 mmol/L (ref 135–145)
Total Bilirubin: 0.4 mg/dL (ref 0.3–1.2)
Total Protein: 5.7 g/dL — ABNORMAL LOW (ref 6.5–8.1)

## 2022-02-03 LAB — GLUCOSE, CAPILLARY
Glucose-Capillary: 109 mg/dL — ABNORMAL HIGH (ref 70–99)
Glucose-Capillary: 119 mg/dL — ABNORMAL HIGH (ref 70–99)
Glucose-Capillary: 85 mg/dL (ref 70–99)

## 2022-02-03 LAB — PROCALCITONIN: Procalcitonin: <0.1 ng/mL

## 2022-02-03 NOTE — Evaluation (Signed)
Physical Therapy Evaluation Patient Details Name: Patricia [name] MRN: [id number] DOB: [birth year] Today's Date: 02/03/2022  History of Present Illness  Patricia [name] is a 71 y.o. female s/p 1 cystoscopy  2. Bilateral retrograde pyelography  3.  Intraoperative fluoroscopy, under one hour, with interpretation  4.  bilateral 6 x 26 JJ stent placement on 02/02/22  with medical history significant of diabetes mellitus type 2, hypertension, hypothyroidism, and more presents the ED with a chief complaint of hypotension.  Unfortunately patient is not able to provide much history.  She knows herself and where she is at, but when asked the year she is peds her full name.  She reports she had been "down there."  When you try to clarify exact location, she reports she is not really sure.  She is also unable to characterize the pain due to not being "really sure."   Clinical Impression  Patient demonstrates slow labored movement for sitting up at bedside, very unsteady on feet having to lean on armrest of chair during transfers without AD, required use of RW for safety and demonstrated slow labored cadence with frequent leaning on walker for support due to generalized weakness and fall risk.  Patient tolerated sitting up in chair after therapy with her daughter present in room - RN aware.  Patient will benefit from continued skilled physical therapy in hospital and recommended venue below to increase strength, balance, endurance for safe ADLs and gait.           Recommendations for follow up therapy are one component of a multi-disciplinary discharge planning process, led by the attending physician.  Recommendations may be updated based on patient status, additional functional criteria and insurance authorization.  Follow Up Recommendations Skilled nursing-short term rehab (<3 hours/day) Can patient physically be transported by private vehicle: Yes    Assistance Recommended at Discharge    Patient can  return home with the following  A lot of help with bathing/dressing/bathroom;A little help with bathing/dressing/bathroom;Help with stairs or ramp for entrance;Assistance with cooking/housework    Equipment Recommendations None recommended by PT  Recommendations for Other Services       Functional Status Assessment Patient has had a recent decline in their functional status and demonstrates the ability to make significant improvements in function in a reasonable and predictable amount of time.     Precautions / Restrictions Precautions Precautions: Fall Restrictions Weight Bearing Restrictions: No      Mobility  Bed Mobility Overal bed mobility: Needs Assistance Bed Mobility: Supine to Sit     Supine to sit: Min assist     General bed mobility comments: increased time, labored movement    Transfers Overall transfer level: Needs assistance Equipment used: Rolling walker (2 wheels) Transfers: Sit to/from Stand, Bed to chair/wheelchair/BSC Sit to Stand: Min assist   Step pivot transfers: Min assist       General transfer comment: slow labored unsteady movement    Ambulation/Gait Ambulation/Gait assistance: Min assist Gait Distance (Feet): 40 Feet Assistive device: Rolling walker (2 wheels) Gait Pattern/deviations: Decreased step length - right, Decreased step length - left, Decreased stride length, Trunk flexed Gait velocity: slow     General Gait Details: unable to ambulate without AD due poor standing balance, required use of RW demonstrating slow labored cadence with frequent leaning over RW to support self, limited mostly due to fatigue  Stairs            Wheelchair Mobility    Modified Rankin (Stroke Patients  Only)       Balance Overall balance assessment: Needs assistance Sitting-balance support: Feet supported, No upper extremity supported Sitting balance-Leahy Scale: Fair Sitting balance - Comments: fair/good seated at EOB   Standing  balance support: During functional activity, No upper extremity supported Standing balance-Leahy Scale: Poor Standing balance comment: fair using RW                             Pertinent Vitals/Pain Pain Assessment Pain Assessment: No/denies pain    Home Living Family/patient expects to be discharged to:: Private residence Living Arrangements: Alone Available Help at Discharge: Family;Available PRN/intermittently Type of Home: House Home Access: Stairs to enter Entrance Stairs-Rails: None Entrance Stairs-Number of Steps: 2   Home Layout: One level Home Equipment: Agricultural consultant (2 wheels);Cane - single point;BSC/3in1      Prior Function Prior Level of Function : Independent/Modified Independent             Mobility Comments: Community ambulator without AD, drives ADLs Comments: Independent     Hand Dominance        Extremity/Trunk Assessment   Upper Extremity Assessment Upper Extremity Assessment: Generalized weakness    Lower Extremity Assessment Lower Extremity Assessment: Generalized weakness    Cervical / Trunk Assessment Cervical / Trunk Assessment: Kyphotic  Communication   Communication: No difficulties  Cognition Arousal/Alertness: Awake/alert Behavior During Therapy: WFL for tasks assessed/performed Overall Cognitive Status: Within Functional Limits for tasks assessed                                          General Comments      Exercises     Assessment/Plan    PT Assessment Patient needs continued PT services  PT Problem List Decreased strength;Decreased activity tolerance;Decreased balance;Decreased mobility       PT Treatment Interventions DME instruction;Gait training;Stair training;Functional mobility training;Therapeutic activities;Therapeutic exercise;Patient/family education;Balance training    PT Goals (Current goals can be found in the Care Plan section)  Acute Rehab PT Goals Patient Stated  Goal: return home after rehab PT Goal Formulation: With patient/family Time For Goal Achievement: 02/17/22 Potential to Achieve Goals: Good    Frequency Min 3X/week     Co-evaluation               AM-PAC PT "6 Clicks" Mobility  Outcome Measure Help needed turning from your back to your side while in a flat bed without using bedrails?: A Little Help needed moving from lying on your back to sitting on the side of a flat bed without using bedrails?: A Little Help needed moving to and from a bed to a chair (including a wheelchair)?: A Little Help needed standing up from a chair using your arms (e.g., wheelchair or bedside chair)?: A Little Help needed to walk in hospital room?: A Lot Help needed climbing 3-5 steps with a railing? : A Lot 6 Click Score: 16    End of Session   Activity Tolerance: Patient tolerated treatment well;Patient limited by fatigue Patient left: in chair;with call bell/phone within reach;with family/visitor present Nurse Communication: Mobility status PT Visit Diagnosis: Unsteadiness on feet (R26.81);Other abnormalities of gait and mobility (R26.89);Muscle weakness (generalized) (M62.81)    Time: 6295-2841 PT Time Calculation (min) (ACUTE ONLY): 20 min   Charges:   PT Evaluation $PT Eval Moderate Complexity: 1 Mod PT Treatments $Therapeutic  Activity: 8-22 mins        12:08 PM, 02/03/22 Ocie Bob, MPT Physical Therapist with [facility] 336 [phone] office [phone] mobile phone

## 2022-02-03 NOTE — Plan of Care (Signed)
Going to Med/Surg Problem: Fluid Volume: Goal: Hemodynamic stability will improve 02/03/2022 1543 by Larose Kells, RN Outcome: Progressing 02/03/2022 1042 by Larose Kells, RN Outcome: Progressing   Problem: Clinical Measurements: Goal: Diagnostic test results will improve 02/03/2022 1543 by Larose Kells, RN Outcome: Progressing 02/03/2022 1042 by Larose Kells, RN Outcome: Progressing Goal: Signs and symptoms of infection will decrease 02/03/2022 1543 by Larose Kells, RN Outcome: Progressing 02/03/2022 1042 by Larose Kells, RN Outcome: Progressing   Problem: Respiratory: Goal: Ability to maintain adequate ventilation will improve 02/03/2022 1543 by Larose Kells, RN Outcome: Progressing 02/03/2022 1042 by Larose Kells, RN Outcome: Progressing   Problem: Education: Goal: Knowledge of General Education information will improve Description: Including pain rating scale, medication(s)/side effects and non-pharmacologic comfort measures 02/03/2022 1543 by Larose Kells, RN Outcome: Progressing 02/03/2022 1042 by Larose Kells, RN Outcome: Progressing   Problem: Health Behavior/Discharge Planning: Goal: Ability to manage health-related needs will improve 02/03/2022 1543 by Larose Kells, RN Outcome: Progressing 02/03/2022 1042 by Larose Kells, RN Outcome: Progressing   Problem: Clinical Measurements: Goal: Ability to maintain clinical measurements within normal limits will improve 02/03/2022 1543 by Larose Kells, RN Outcome: Progressing 02/03/2022 1042 by Larose Kells, RN Outcome: Progressing Goal: Will remain free from infection 02/03/2022 1543 by Larose Kells, RN Outcome: Progressing 02/03/2022 1042 by Larose Kells, RN Outcome: Progressing Goal: Diagnostic test results will improve 02/03/2022 1543 by Larose Kells, RN Outcome: Progressing 02/03/2022 1042 by Larose Kells, RN Outcome: Progressing Goal: Respiratory complications will  improve 02/03/2022 1543 by Larose Kells, RN Outcome: Progressing 02/03/2022 1042 by Larose Kells, RN Outcome: Progressing Goal: Cardiovascular complication will be avoided 02/03/2022 1543 by Larose Kells, RN Outcome: Progressing 02/03/2022 1042 by Larose Kells, RN Outcome: Progressing   Problem: Activity: Goal: Risk for activity intolerance will decrease 02/03/2022 1543 by Larose Kells, RN Outcome: Progressing 02/03/2022 1042 by Larose Kells, RN Outcome: Progressing   Problem: Nutrition: Goal: Adequate nutrition will be maintained 02/03/2022 1543 by Larose Kells, RN Outcome: Progressing 02/03/2022 1042 by Larose Kells, RN Outcome: Progressing   Problem: Coping: Goal: Level of anxiety will decrease 02/03/2022 1543 by Larose Kells, RN Outcome: Progressing 02/03/2022 1042 by Larose Kells, RN Outcome: Progressing   Problem: Elimination: Goal: Will not experience complications related to bowel motility 02/03/2022 1543 by Larose Kells, RN Outcome: Progressing 02/03/2022 1042 by Larose Kells, RN Outcome: Progressing Goal: Will not experience complications related to urinary retention 02/03/2022 1543 by Larose Kells, RN Outcome: Progressing 02/03/2022 1042 by Larose Kells, RN Outcome: Progressing   Problem: Pain Managment: Goal: General experience of comfort will improve 02/03/2022 1543 by Larose Kells, RN Outcome: Progressing 02/03/2022 1042 by Larose Kells, RN Outcome: Progressing   Problem: Safety: Goal: Ability to remain free from injury will improve 02/03/2022 1543 by Larose Kells, RN Outcome: Progressing 02/03/2022 1042 by Larose Kells, RN Outcome: Progressing   Problem: Skin Integrity: Goal: Risk for impaired skin integrity will decrease 02/03/2022 1543 by Larose Kells, RN Outcome: Progressing 02/03/2022 1042 by Larose Kells, RN Outcome: Progressing

## 2022-02-03 NOTE — Plan of Care (Signed)
  Problem: Acute Rehab PT Goals(only PT should resolve) Goal: Pt Will Go Supine/Side To Sit Outcome: Progressing Flowsheets (Taken 02/03/2022 1209) Pt will go Supine/Side to Sit: with min guard assist Goal: Patient Will Transfer Sit To/From Stand Outcome: Progressing Flowsheets (Taken 02/03/2022 1209) Patient will transfer sit to/from stand: with min guard assist Goal: Pt Will Transfer Bed To Chair/Chair To Bed Outcome: Progressing Flowsheets (Taken 02/03/2022 1209) Pt will Transfer Bed to Chair/Chair to Bed: min guard assist Goal: Pt Will Ambulate Outcome: Progressing Flowsheets (Taken 02/03/2022 1209) Pt will Ambulate:  75 feet  with min guard assist  with rolling walker   12:10 PM, 02/03/22 Ocie Bob, MPT Physical Therapist with [facility] [id number] [phone] office [phone] mobile phone

## 2022-02-03 NOTE — Consult Note (Signed)
[facility] Renal Consultation Note  Requesting MD: Shahmehdi Indication for Consultation: AKI  HPI:  Patricia [name] is a 71 y.o. female with past medical history significant for T2DM, HTN on benicar and overactive bladder.  She presented to the ER on 8/13 with N/V/D and crt up from baseline of below 1 to 1.56.  She was treated symptmantically and discharged.  She then represented on  8/15 in the afternoon with dizziness and confusion -  found to be hypotensive-  crt found to be 5.33 with K of 6, WBC of 13 and urine looking like UTI.   imaging revealed a right sided ureteral calculus and hydronephrosis.  She was admitted-  ARB held-  given fluids and zosyn- and also underwent a cysto with stone removal and bilateral double j stent placements-  UOP 1700 and crt down to 2.6 today.  Of note, mobic was also on her OP med list.  I have seen her in the ICU-  she feels much better today   Creatinine, Ser  Date/Time Value Ref Range Status  02/03/2022 05:00 AM 2.61 (H) 0.44 - 1.00 mg/dL Final    Comment:    DELTA CHECK NOTED  02/02/2022 12:24 AM 4.54 (H) 0.44 - 1.00 mg/dL Final  45/40/9811 91:47 PM 4.84 (H) 0.44 - 1.00 mg/dL Final  82/95/6213 08:65 PM 5.33 (H) 0.44 - 1.00 mg/dL Final  78/46/9629 52:84 PM 1.56 (H) 0.44 - 1.00 mg/dL Final  13/24/4010 27:25 PM 0.93 0.44 - 1.00 mg/dL Final  36/64/4034 74:25 AM 0.84 0.44 - 1.00 mg/dL Final  95/63/8756 43:32 PM 0.73 0.44 - 1.00 mg/dL Final  95/18/8416 60:63 PM 0.70 0.44 - 1.00 mg/dL Final  01/60/1093 23:55 PM 0.77 0.44 - 1.00 mg/dL Final  73/22/0254 27:06 AM 0.70 0.4 - 1.2 mg/dL Final  23/76/2831 51:76 AM 0.77 0.4 - 1.2 mg/dL Final     PMHx:   Past Medical History:  Diagnosis Date   Arthritis    Diabetes mellitus without complication (HCC)    Hypertension    Hypothyroidism    PONV (postoperative nausea and vomiting)     Past Surgical History:  Procedure Laterality Date   ABDOMINAL HYSTERECTOMY     CHOLECYSTECTOMY N/A  03/02/2015   Procedure: LAPAROSCOPIC CHOLECYSTECTOMY;  Surgeon: Franky Macho, MD;  Location: [facility];  Service: General;  Laterality: N/A;   HOLMIUM LASER APPLICATION Right 05/02/2019   Procedure: HOLMIUM LASER APPLICATION;  Surgeon: Marcine Matar, MD;  Location: [facility];  Service: Urology;  Laterality: Right;   IR URETERAL STENT RIGHT NEW ACCESS W/O SEP NEPHROSTOMY CATH  05/02/2019   KNEE ARTHROSCOPY Right    LIVER BIOPSY  03/02/2015   Procedure: LAPAROSCOPIC LIVER BIOPSY;  Surgeon: Franky Macho, MD;  Location: [facility];  Service: General;;   NEPHROLITHOTOMY Right 05/02/2019   Procedure: NEPHROLITHOTOMY PERCUTANEOUS;  Surgeon: Marcine Matar, MD;  Location: [facility];  Service: Urology;  Laterality: Right;  3 HRS   THYROIDECTOMY     TONSILLECTOMY  1970   TUBAL LIGATION      Family Hx: History reviewed. No pertinent family history.  Social History:  reports that she has never smoked. She has never used smokeless tobacco. She reports that she does not drink alcohol and does not use drugs.  Allergies:  Allergies  Allergen Reactions   Amlodipine     Swelling   Keflex [Cephalexin] Rash    Medications: Prior to Admission medications   Medication Sig Start Date End Date Taking? Authorizing Provider  APPLE CIDER  VINEGAR PO Take 1 tablet by mouth daily.   Yes [provider]  cetirizine (ZYRTEC) 10 MG tablet Take 10 mg by mouth daily.   Yes [provider]  CINNAMON PO Take 1,000 mg by mouth daily.   Yes [provider]  ciprofloxacin (CIPRO) 500 MG tablet Take 1 tablet (500 mg total) by mouth every 12 (twelve) hours. 01/30/22  Yes Lonell Grandchild, MD  cyclobenzaprine (FLEXERIL) 10 MG tablet Take 10 mg by mouth 3 (three) times daily as needed for muscle spasms.   Yes [provider]  DILT-XR 180 MG 24 hr capsule Take 180 mg by mouth daily. 08/18/21   [provider]  escitalopram (LEXAPRO) 5 MG tablet Take 5 mg by mouth daily. 07/17/19    [provider]  levothyroxine (SYNTHROID) 150 MCG tablet Take 150 mcg by mouth daily. 07/05/19   [provider]  levothyroxine (SYNTHROID) 175 MCG tablet Take 175 mcg by mouth daily. 11/10/21   [provider]  meclizine (ANTIVERT) 25 MG tablet Take 25 mg by mouth daily as needed for nausea. 11/12/20   [provider]  meloxicam (MOBIC) 15 MG tablet Take 15 mg by mouth as needed for pain.    [provider]  metFORMIN (GLUCOPHAGE) 1000 MG tablet Take 1,000 mg by mouth 2 (two) times daily. 11/25/19   [provider]  metFORMIN (GLUCOPHAGE) 500 MG tablet Take 500 mg by mouth 2 (two) times daily. 11/08/21   [provider]  olmesartan (BENICAR) 40 MG tablet Take 40 mg by mouth daily. 04/05/19   [provider]  Omega-3 Fatty Acids (FISH OIL) 1000 MG CAPS Take 1 capsule by mouth daily.    [provider]  ondansetron (ZOFRAN) 4 MG tablet Take 1 tablet (4 mg total) by mouth every 6 (six) hours. 01/30/22   Lonell Grandchild, MD  pregabalin (LYRICA) 200 MG capsule Take 200 mg by mouth 2 (two) times daily. 01/18/22   [provider]  simvastatin (ZOCOR) 20 MG tablet Take 20 mg by mouth daily.    [provider]  solifenacin (VESICARE) 10 MG tablet Take 1 tablet (10 mg total) by mouth daily. 02/01/22   Marcine Matar, MD  TIADYLT ER 360 MG 24 hr capsule Take 360 mg by mouth daily. 11/28/21   [provider]    I have reviewed the patient's current medications.  Labs:  Results for orders placed or performed during the hospital encounter of 02/01/22 (from the past 48 hour(s))  CBC with Differential     Status: Abnormal   Collection Time: 02/01/22  2:51 PM  Result Value Ref Range   WBC 13.1 (H) 4.0 - 10.5 K/uL   RBC 4.30 3.87 - 5.11 MIL/uL   Hemoglobin 12.8 12.0 - 15.0 g/dL   HCT 16.1 09.6 - 04.5 %   MCV 93.0 80.0 - 100.0 fL   MCH 29.8 26.0 - 34.0 pg   MCHC 32.0 30.0 - 36.0 g/dL   RDW 40.9 (H)  81.1 - 15.5 %   Platelets 367 150 - 400 K/uL   nRBC 0.0 0.0 - 0.2 %   Neutrophils Relative % 71 %   Neutro Abs 9.5 (H) 1.7 - 7.7 K/uL   Lymphocytes Relative 17 %   Lymphs Abs 2.2 0.7 - 4.0 K/uL   Monocytes Relative 9 %   Monocytes Absolute 1.1 (H) 0.1 - 1.0 K/uL   Eosinophils Relative 1 %   Eosinophils Absolute 0.1 0.0 - 0.5 K/uL  Basophils Relative 1 %   Basophils Absolute 0.1 0.0 - 0.1 K/uL   Immature Granulocytes 1 %   Abs Immature Granulocytes 0.09 (H) 0.00 - 0.07 K/uL    Comment: Performed at [facility], [address] [place], [place] [address]  Comprehensive metabolic panel     Status: Abnormal   Collection Time: 02/01/22  2:51 PM  Result Value Ref Range   Sodium 137 135 - 145 mmol/L   Potassium 6.0 (H) 3.5 - 5.1 mmol/L   Chloride 116 (H) 98 - 111 mmol/L   CO2 11 (L) 22 - 32 mmol/L   Glucose, Bld 134 (H) 70 - 99 mg/dL    Comment: Glucose reference range applies only to samples taken after fasting for at least 8 hours.   BUN 49 (H) 8 - 23 mg/dL   Creatinine, Ser 6.575.33 (H) 0.44 - 1.00 mg/dL   Calcium 9.8 8.9 - 84.610.3 mg/dL   Total Protein 7.3 6.5 - 8.1 g/dL   Albumin 4.0 3.5 - 5.0 g/dL   AST 25 15 - 41 U/L   ALT 28 0 - 44 U/L   Alkaline Phosphatase 64 38 - 126 U/L   Total Bilirubin 0.3 0.3 - 1.2 mg/dL   GFR, Estimated 8 (L) >60 mL/min    Comment: (NOTE) Calculated using the CKD-EPI Creatinine Equation (2021)    Anion gap 10 5 - 15    Comment: Performed at [facility], [address] [place], [place] [address]  Troponin I (High Sensitivity)     Status: None   Collection Time: 02/01/22  2:51 PM  Result Value Ref Range   Troponin I (High Sensitivity) 3 <18 ng/L    Comment: (NOTE) Elevated high sensitivity troponin I (hsTnI) values and significant  changes across serial measurements may suggest ACS but many other  chronic and acute conditions are known to elevate hsTnI results.  Refer to the "Links" section for chest pain algorithms and additional   guidance. Performed at [facility], [address] [place], [place] [address]   POC CBG, ED     Status: Abnormal   Collection Time: 02/01/22  2:54 PM  Result Value Ref Range   Glucose-Capillary 118 (H) 70 - 99 mg/dL    Comment: Glucose reference range applies only to samples taken after fasting for at least 8 hours.  Troponin I (High Sensitivity)     Status: None   Collection Time: 02/01/22  5:55 PM  Result Value Ref Range   Troponin I (High Sensitivity) 3 <18 ng/L    Comment: (NOTE) Elevated high sensitivity troponin I (hsTnI) values and significant  changes across serial measurements may suggest ACS but many other  chronic and acute conditions are known to elevate hsTnI results.  Refer to the "Links" section for chest pain algorithms and additional  guidance. Performed at [facility], [address] [place], [place] [address]   Procalcitonin - Baseline     Status: None   Collection Time: 02/01/22  5:55 PM  Result Value Ref Range   Procalcitonin 0.10 ng/mL    Comment:        Interpretation: PCT (Procalcitonin) <= 0.5 ng/mL: Systemic infection (sepsis) is not likely. Local bacterial infection is possible. (NOTE)       Sepsis PCT Algorithm           Lower Respiratory Tract  Infection PCT Algorithm    ----------------------------     ----------------------------         PCT < 0.25 ng/mL                PCT < 0.10 ng/mL          Strongly encourage             Strongly discourage   discontinuation of antibiotics    initiation of antibiotics    ----------------------------     -----------------------------       PCT 0.25 - 0.50 ng/mL            PCT 0.10 - 0.25 ng/mL               OR       >80% decrease in PCT            Discourage initiation of                                            antibiotics      Encourage discontinuation           of antibiotics    ----------------------------     -----------------------------         PCT >= 0.50  ng/mL              PCT 0.26 - 0.50 ng/mL               AND        <80% decrease in PCT             Encourage initiation of                                             antibiotics       Encourage continuation           of antibiotics    ----------------------------     -----------------------------        PCT >= 0.50 ng/mL                  PCT > 0.50 ng/mL               AND         increase in PCT                  Strongly encourage                                      initiation of antibiotics    Strongly encourage escalation           of antibiotics                                     -----------------------------                                           PCT <= 0.25 ng/mL  OR                                        > 80% decrease in PCT                                      Discontinue / Do not initiate                                             antibiotics  Performed at [facility], [address] [place], [place] [address]   SARS Coronavirus 2 by RT PCR (hospital order, performed in [facility] hospital lab) *cepheid single result test* Anterior Nasal Swab     Status: None   Collection Time: 02/01/22  6:23 PM   Specimen: Anterior Nasal Swab  Result Value Ref Range   SARS Coronavirus 2 by RT PCR NEGATIVE NEGATIVE    Comment: (NOTE) SARS-CoV-2 target nucleic acids are NOT DETECTED.  The SARS-CoV-2 RNA is generally detectable in upper and lower respiratory specimens during the acute phase of infection. The lowest concentration of SARS-CoV-2 viral copies this assay can detect is 250 copies / mL. A negative result does not preclude SARS-CoV-2 infection and should not be used as the sole basis for treatment or other patient management decisions.  A negative result may occur with improper specimen collection / handling, submission of specimen other than nasopharyngeal swab, presence of viral mutation(s) within the areas targeted by  this assay, and inadequate number of viral copies (<250 copies / mL). A negative result must be combined with clinical observations, patient history, and epidemiological information.  Fact Sheet for Patients:   RoadLapTop.co.za  Fact Sheet for Healthcare Providers: http://kim-miller.com/  This test is not yet approved or  cleared by the [place] FDA and has been authorized for detection and/or diagnosis of SARS-CoV-2 by FDA under an Emergency Use Authorization (EUA).  This EUA will remain in effect (meaning this test can be used) for the duration of the COVID-19 declaration under Section 564(b)(1) of the Act, 21 U.S.C. section 360bbb-3(b)(1), unless the authorization is terminated or revoked sooner.  Performed at [facility], [address], [place], [place] [address]   Urinalysis, Routine w reflex microscopic Urine, Clean Catch     Status: Abnormal   Collection Time: 02/01/22  9:40 PM  Result Value Ref Range   Color, Urine YELLOW YELLOW   APPearance CLOUDY (A) CLEAR   Specific Gravity, Urine 1.013 1.005 - 1.030   pH 5.0 5.0 - 8.0   Glucose, UA NEGATIVE NEGATIVE mg/dL   Hgb urine dipstick NEGATIVE NEGATIVE   Bilirubin Urine NEGATIVE NEGATIVE   Ketones, ur NEGATIVE NEGATIVE mg/dL   Protein, ur 283 (A) NEGATIVE mg/dL   Nitrite NEGATIVE NEGATIVE   Leukocytes,Ua MODERATE (A) NEGATIVE   RBC / HPF 6-10 0 - 5 RBC/hpf   WBC, UA 21-50 0 - 5 WBC/hpf   Bacteria, UA MANY (A) NONE SEEN   Squamous Epithelial / LPF 0-5 0 - 5   WBC Clumps PRESENT    Mucus PRESENT    Hyaline Casts, UA PRESENT     Comment: Performed at [facility], [address] [place], [place] [address]  Lactic acid, plasma     Status: None   Collection Time: 02/01/22 11:00 PM  Result Value Ref Range   Lactic Acid, Venous 1.4 0.5 - 1.9 mmol/L    Comment: Performed at [facility], [address] [place], [place] [address]  Basic metabolic panel     Status: Abnormal    Collection Time: 02/01/22 11:00 PM  Result Value Ref Range   Sodium 136 135 - 145 mmol/L   Potassium 5.4 (H) 3.5 - 5.1 mmol/L   Chloride 117 (H) 98 - 111 mmol/L   CO2 12 (L) 22 - 32 mmol/L   Glucose, Bld 123 (H) 70 - 99 mg/dL    Comment: Glucose reference range applies only to samples taken after fasting for at least 8 hours.   BUN 50 (H) 8 - 23 mg/dL   Creatinine, Ser 0.62 (H) 0.44 - 1.00 mg/dL   Calcium 8.7 (L) 8.9 - 10.3 mg/dL   GFR, Estimated 9 (L) >60 mL/min    Comment: (NOTE) Calculated using the CKD-EPI Creatinine Equation (2021)    Anion gap 7 5 - 15    Comment: Performed at [facility], [address], [place], [place] [address]  HIV Antibody (routine testing w rflx)     Status: None   Collection Time: 02/01/22 11:00 PM  Result Value Ref Range   HIV Screen 4th Generation wRfx Non Reactive Non Reactive    Comment: Performed at [facility], [address], [place], [place] [address]  Culture, blood (Routine X 2) w Reflex to ID Panel     Status: None (Preliminary result)   Collection Time: 02/02/22 12:24 AM   Specimen: BLOOD  Result Value Ref Range   Specimen Description BLOOD RIGHT THUMB    Special Requests      BOTTLES DRAWN AEROBIC AND ANAEROBIC Blood Culture adequate volume   Culture      NO GROWTH 1 DAY Performed at [facility], [address] [place], [place] [address]    Report Status PENDING   Culture, blood (Routine X 2) w Reflex to ID Panel     Status: None (Preliminary result)   Collection Time: 02/02/22 12:24 AM   Specimen: BLOOD  Result Value Ref Range   Specimen Description BLOOD BLOOD LEFT FOREARM    Special Requests      BOTTLES DRAWN AEROBIC ONLY Blood Culture adequate volume   Culture      NO GROWTH 1 DAY Performed at [facility], [address] [place], [place] [address]    Report Status PENDING   Cortisol-am, blood     Status: None   Collection Time: 02/02/22 12:24 AM  Result Value Ref Range   Cortisol - AM 9.5 6.7 - 22.6 ug/dL     Comment: Performed at [facility], [address], [place], [place] [address]  Comprehensive metabolic panel     Status: Abnormal   Collection Time: 02/02/22 12:24 AM  Result Value Ref Range   Sodium 137 135 - 145 mmol/L   Potassium 5.2 (H) 3.5 - 5.1 mmol/L   Chloride 117 (H) 98 - 111 mmol/L   CO2 14 (L) 22 - 32 mmol/L   Glucose, Bld 102 (H) 70 - 99 mg/dL    Comment: Glucose reference range applies only to samples taken after fasting for at least 8 hours.   BUN 48 (H) 8 - 23 mg/dL   Creatinine, Ser 5.46 (H) 0.44 - 1.00 mg/dL   Calcium 8.9 8.9 -  10.3 mg/dL   Total Protein 6.3 (L) 6.5 - 8.1 g/dL   Albumin 3.4 (L) 3.5 - 5.0 g/dL   AST 23 15 - 41 U/L   ALT 23 0 - 44 U/L   Alkaline Phosphatase 54 38 - 126 U/L   Total Bilirubin 0.3 0.3 - 1.2 mg/dL   GFR, Estimated 10 (L) >60 mL/min    Comment: (NOTE) Calculated using the CKD-EPI Creatinine Equation (2021)    Anion gap 6 5 - 15    Comment: Performed at [facility], [address], [place], [place] [address]  Magnesium     Status: None   Collection Time: 02/02/22 12:24 AM  Result Value Ref Range   Magnesium 1.8 1.7 - 2.4 mg/dL    Comment: Performed at [facility], [address], [place], [place] [address]  CBC with Differential/Platelet     Status: Abnormal   Collection Time: 02/02/22 12:24 AM  Result Value Ref Range   WBC 9.6 4.0 - 10.5 K/uL   RBC 4.01 3.87 - 5.11 MIL/uL   Hemoglobin 11.8 (L) 12.0 - 15.0 g/dL   HCT 96.2 95.2 - 84.1 %   MCV 96.8 80.0 - 100.0 fL   MCH 29.4 26.0 - 34.0 pg   MCHC 30.4 30.0 - 36.0 g/dL   RDW 32.4 (H) 40.1 - 02.7 %   Platelets 255 150 - 400 K/uL   nRBC 0.0 0.0 - 0.2 %   Neutrophils Relative % 53 %   Neutro Abs 5.2 1.7 - 7.7 K/uL   Lymphocytes Relative 34 %   Lymphs Abs 3.2 0.7 - 4.0 K/uL   Monocytes Relative 10 %   Monocytes Absolute 1.0 0.1 - 1.0 K/uL   Eosinophils Relative 1 %   Eosinophils Absolute 0.1 0.0 - 0.5 K/uL   Basophils Relative 1 %   Basophils Absolute 0.1 0.0 - 0.1 K/uL    Immature Granulocytes 1 %   Abs Immature Granulocytes 0.06 0.00 - 0.07 K/uL    Comment: Performed at [facility], [address], [place], [place] [address]  TSH     Status: None   Collection Time: 02/02/22 12:24 AM  Result Value Ref Range   TSH 0.461 0.350 - 4.500 uIU/mL    Comment: Performed by a 3rd Generation assay with a functional sensitivity of <=0.01 uIU/mL. Performed at [facility], [address] [place], [place] [address]   Procalcitonin     Status: None   Collection Time: 02/02/22  3:39 AM  Result Value Ref Range   Procalcitonin 0.12 ng/mL    Comment:        Interpretation: PCT (Procalcitonin) <= 0.5 ng/mL: Systemic infection (sepsis) is not likely. Local bacterial infection is possible. (NOTE)       Sepsis PCT Algorithm           Lower Respiratory Tract                                      Infection PCT Algorithm    ----------------------------     ----------------------------         PCT < 0.25 ng/mL                PCT < 0.10 ng/mL          Strongly encourage             Strongly discourage   discontinuation of antibiotics  initiation of antibiotics    ----------------------------     -----------------------------       PCT 0.25 - 0.50 ng/mL            PCT 0.10 - 0.25 ng/mL               OR       >80% decrease in PCT            Discourage initiation of                                            antibiotics      Encourage discontinuation           of antibiotics    ----------------------------     -----------------------------         PCT >= 0.50 ng/mL              PCT 0.26 - 0.50 ng/mL               AND        <80% decrease in PCT             Encourage initiation of                                             antibiotics       Encourage continuation           of antibiotics    ----------------------------     -----------------------------        PCT >= 0.50 ng/mL                  PCT > 0.50 ng/mL               AND         increase in PCT                   Strongly encourage                                      initiation of antibiotics    Strongly encourage escalation           of antibiotics                                     -----------------------------                                           PCT <= 0.25 ng/mL                                                 OR                                        >  80% decrease in PCT                                      Discontinue / Do not initiate                                             antibiotics  Performed at [facility], [address], [place], [place] [address]   Protime-INR     Status: None   Collection Time: 02/02/22  5:23 AM  Result Value Ref Range   Prothrombin Time 14.3 11.4 - 15.2 seconds   INR 1.1 0.8 - 1.2    Comment: (NOTE) INR goal varies based on device and disease states. Performed at [facility], [address] [place], [place] [address]   Glucose, capillary     Status: None   Collection Time: 02/02/22 11:06 AM  Result Value Ref Range   Glucose-Capillary 82 70 - 99 mg/dL    Comment: Glucose reference range applies only to samples taken after fasting for at least 8 hours.  Glucose, capillary     Status: Abnormal   Collection Time: 02/02/22 11:38 AM  Result Value Ref Range   Glucose-Capillary 114 (H) 70 - 99 mg/dL    Comment: Glucose reference range applies only to samples taken after fasting for at least 8 hours.  Glucose, capillary     Status: Abnormal   Collection Time: 02/02/22  1:41 PM  Result Value Ref Range   Glucose-Capillary 66 (L) 70 - 99 mg/dL    Comment: Glucose reference range applies only to samples taken after fasting for at least 8 hours.  Glucose, capillary     Status: None   Collection Time: 02/02/22  1:57 PM  Result Value Ref Range   Glucose-Capillary 99 70 - 99 mg/dL    Comment: Glucose reference range applies only to samples taken after fasting for at least 8 hours.  Glucose, capillary     Status: None   Collection Time: 02/02/22  2:27  PM  Result Value Ref Range   Glucose-Capillary 70 70 - 99 mg/dL    Comment: Glucose reference range applies only to samples taken after fasting for at least 8 hours.  MRSA Next Gen by PCR, Nasal     Status: None   Collection Time: 02/02/22  2:45 PM   Specimen: Nasal Mucosa; Nasal Swab  Result Value Ref Range   MRSA by PCR Next Gen NOT DETECTED NOT DETECTED    Comment: (NOTE) The GeneXpert MRSA Assay (FDA approved for NASAL specimens only), is one component of a comprehensive MRSA colonization surveillance program. It is not intended to diagnose MRSA infection nor to guide or monitor treatment for MRSA infections. Test performance is not FDA approved in patients less than 24 years old. Performed at [facility], [address], [place], [place] [address]   Glucose, capillary     Status: Abnormal   Collection Time: 02/02/22  7:08 PM  Result Value Ref Range   Glucose-Capillary 113 (H) 70 - 99 mg/dL    Comment: Glucose reference range applies only to samples taken after fasting for at least 8 hours.  Glucose, capillary     Status: Abnormal   Collection Time: 02/02/22 11:16 PM  Result Value Ref Range   Glucose-Capillary 117 (H)  70 - 99 mg/dL    Comment: Glucose reference range applies only to samples taken after fasting for at least 8 hours.  CBC     Status: Abnormal   Collection Time: 02/03/22  5:00 AM  Result Value Ref Range   WBC 8.1 4.0 - 10.5 K/uL   RBC 3.57 (L) 3.87 - 5.11 MIL/uL   Hemoglobin 10.6 (L) 12.0 - 15.0 g/dL   HCT 16.1 (L) 09.6 - 04.5 %   MCV 92.2 80.0 - 100.0 fL   MCH 29.7 26.0 - 34.0 pg   MCHC 32.2 30.0 - 36.0 g/dL   RDW 40.9 81.1 - 91.4 %   Platelets 216 150 - 400 K/uL   nRBC 0.0 0.0 - 0.2 %    Comment: Performed at [facility], [address] [place], [place] [address]  Comprehensive metabolic panel     Status: Abnormal   Collection Time: 02/03/22  5:00 AM  Result Value Ref Range   Sodium 139 135 - 145 mmol/L   Potassium 4.7 3.5 - 5.1 mmol/L   Chloride 121  (H) 98 - 111 mmol/L   CO2 15 (L) 22 - 32 mmol/L   Glucose, Bld 117 (H) 70 - 99 mg/dL    Comment: Glucose reference range applies only to samples taken after fasting for at least 8 hours.   BUN 42 (H) 8 - 23 mg/dL   Creatinine, Ser 6.21 (H) 0.44 - 1.00 mg/dL    Comment: DELTA CHECK NOTED   Calcium 8.4 (L) 8.9 - 10.3 mg/dL   Total Protein 5.7 (L) 6.5 - 8.1 g/dL   Albumin 2.9 (L) 3.5 - 5.0 g/dL   AST 62 (H) 15 - 41 U/L   ALT 34 0 - 44 U/L   Alkaline Phosphatase 46 38 - 126 U/L   Total Bilirubin 0.4 0.3 - 1.2 mg/dL   GFR, Estimated 19 (L) >60 mL/min    Comment: (NOTE) Calculated using the CKD-EPI Creatinine Equation (2021)    Anion gap 3 (L) 5 - 15    Comment: Performed at [facility], [address] [place], [place] [address]  Glucose, capillary     Status: Abnormal   Collection Time: 02/03/22  5:08 AM  Result Value Ref Range   Glucose-Capillary 109 (H) 70 - 99 mg/dL    Comment: Glucose reference range applies only to samples taken after fasting for at least 8 hours.     ROS:  A comprehensive review of systems was negative.  Physical Exam: Vitals:   02/03/22 0600 02/03/22 0717  BP:    Pulse:  71  Resp:  18  Temp: 97.9 F (36.6 C) (!) 97.5 F (36.4 C)  SpO2:  99%     General:  pale WF resting in bed-  NAD HEENT: PERRLA, EOMI, mucous membranes moist Neck: no JVD Heart: RRR Lungs: mostly clear Abdomen: slightly distended-  non tender Extremities: arms some edema but no dependent edema Skin: warm and day Neuro: grossly normal  Assessment/Plan: 71 year old WF with DM, HTN, bladder issues-  presents with hypotension and AKI-  found to have obstructing stone and UTI 1.Renal- baseline renal function normal but last known 2022.  Crt 1.58 on 8/13 but was hypotensive on benicar and mobic at the time.  Then more severe AKI with continued hypotension/UTI and a unilateral obstructing stone with bilateral hydro-  now s/p bilateral double J stents.  Feel AKI was from obstruction  plus ATN from hypotension on ARB-  fortunately with a  better BP and GU stenting crt is much improved as is she.  Agree with medical management to date  2. GU-  s/p stone retrieval and bilat double J stent placement with good clinical results.  Also on zosyn 3. Hypertension/volume  - was hypotensive on benicar-  is on hold and has been getting IVF.  I am going to decrease rate since BP has been OK-  watch for volume overload  4. Hyperkalemia-  improved with medical management  5. Anemia  - has not been a major issue to date   Cecille Aver 02/03/2022, 7:20 AM

## 2022-02-03 NOTE — Progress Notes (Signed)
Patient transported to 322 from ICU. Large blister noted beneath IV site with arm swelling. IV removed and blister popped. Mepilex applied. New IV obtained with [place]. Family mentioned patient had a fall in the parking lot before entering the emergency room and wanted the doctor to be aware incase something was injured and contributing to her pain/ difficulty moving around. MD Shahmehdi notified.

## 2022-02-03 NOTE — TOC Initial Note (Signed)
Transition of Care [facility]) - Initial/Assessment Note    Patient Details  Name: Patricia [name] MRN: [id number] Date of Birth: 05-Feb-1951  Transition [facility]) CM/SW Contact:    Leitha Bleak, RN Phone Number: 02/03/2022, 1:06 PM  Clinical Narrative:       Patient admitted with Sepsis. PT recommended SNF. Daughter is at the bedside, she states her mother is post surg and she is weak. She is up in the chair and will go to med surg today. Daughter plans to assist at home and wants home health PT. MD aware to order. TOC to follow for [facility] agencies to accept the referral.         Expected Discharge Plan: Home w Home Health Services Barriers to Discharge: Continued Medical Work up  Patient Goals and CMS Choice Patient states their goals for this hospitalization and ongoing recovery are:: to get better CMS Medicare.gov Compare Post Acute Care list provided to:: Patient Represenative (must comment) Choice offered to / list presented to : Adult Children  Expected Discharge Plan and Services Expected Discharge Plan: Home w Home Health Services     Post Acute Care Choice: Home Health Living arrangements for the past 2 months: Single Family Home                     Prior Living Arrangements/Services Living arrangements for the past 2 months: Single Family Home Lives with:: Self Patient language and need for interpreter reviewed:: Yes        Need for Family Participation in Patient Care: Yes (Comment) Care giver support system in place?: Yes (comment)   Criminal Activity/Legal Involvement Pertinent to Current Situation/Hospitalization: No - Comment as needed  Activities of Daily Living Home Assistive Devices/Equipment: Environmental consultant (specify type), Other (Comment) (Daughter states she does not use) ADL Screening (condition at time of admission) Patient's cognitive ability adequate to safely complete daily activities?: Yes Is the patient deaf or have difficulty hearing?: No Does the  patient have difficulty seeing, even when wearing glasses/contacts?: No Does the patient have difficulty concentrating, remembering, or making decisions?: Yes Patient able to express need for assistance with ADLs?: Yes Does the patient have difficulty dressing or bathing?: Yes Independently performs ADLs?: No (Independent at home but has been falling/tripping) Communication: Independent Feeding: Independent Bathing: Needs assistance Is this a change from baseline?: Change from baseline, expected to last <3 days Toileting: Needs assistance Is this a change from baseline?: Change from baseline, expected to last <3 days In/Out Bed: Needs assistance Is this a change from baseline?: Change from baseline, expected to last <3 days Walks in Home: Independent Does the patient have difficulty walking or climbing stairs?: Yes Weakness of Legs: Both Weakness of Arms/Hands: None  Permission Sought/Granted      Share Information with NAME: Crystal     Permission granted to share info w Relationship: Daughter     Emotional Assessment    Affect (typically observed): Accepting Orientation: : Oriented to Self, Oriented to Place, Oriented to  Time, Oriented to Situation Alcohol / Substance Use: Not Applicable Psych Involvement: No (comment)  Admission diagnosis:  Hyperkalemia [E87.5] Acute kidney injury (HCC) [N17.9] Sepsis (HCC) [A41.9] Hypotension, unspecified hypotension type [I95.9] Altered mental status, unspecified altered mental status type [R41.82] Patient Active Problem List   Diagnosis Date Noted   AKI (acute kidney injury) (HCC) 02/02/2022   Hyperkalemia 02/02/2022   UTI (urinary tract infection) 02/02/2022   Hypothyroidism 02/02/2022   Essential hypertension 02/02/2022   Sepsis (  HCC) 02/01/2022   Gastroesophageal reflux disease without esophagitis 08/16/2021   Positive occult stool blood test 08/16/2021   Calculus of kidney 05/02/2019   Pain in thoracic spine 12/26/2013    Lumbago 12/26/2013   Stiffness of joint, not elsewhere classified, pelvic region and thigh 12/26/2013   Abnormality of gait 12/26/2013   PCP:  Assunta Found, MD Pharmacy:   [facility] [phone] - [place], [place] - [address] AT [facility] [address] [place] [address] Phone: [phone] Fax: [phone]

## 2022-02-03 NOTE — Progress Notes (Signed)
Initial Nutrition Assessment  DOCUMENTATION CODES:   Obesity unspecified  INTERVENTION:  Medical sales representative Breakfast daily  Greek yogurt daily  NUTRITION DIAGNOSIS:   Inadequate oral intake related to acute illness as evidenced by per patient/family report.   GOAL:  Patient will meet greater than or equal to 90% of their needs  MONITOR:  PO intake, Supplement acceptance, Labs  REASON FOR ASSESSMENT:   Malnutrition Screening Tool    ASSESSMENT: Patient is a 71 yo female with DM2, obesity, HTN and arthritis. Presents with sepsis- UTI and AKI. She is s/p cystoscopy and bilateral double J stent placement 8/16 per MD.   Patient able to feed herself. Patient reports decreased intake a few days PTA. Daughter is bedside and patient up in chair. Patient reports po intake >50% of breakfast and lunch. She doesn't like Ensure but is willing to drink [employer]. Encouraged protein each meal and reviewed healthy options. Plans to return home per TOC.   Stable weight history. Medications: synthroid, lyrica  IVF-NS@ 75 ml/hr.       Latest Ref Rng & Units 02/03/2022    5:00 AM 02/02/2022   12:24 AM 02/01/2022   11:00 PM  BMP  Glucose 70 - 99 mg/dL 010  932  355   BUN 8 - 23 mg/dL 42  48  50   Creatinine 0.44 - 1.00 mg/dL 7.32  2.02  5.42   Sodium 135 - 145 mmol/L 139  137  136   Potassium 3.5 - 5.1 mmol/L 4.7  5.2  5.4   Chloride 98 - 111 mmol/L 121  117  117   CO2 22 - 32 mmol/L 15  14  12    Calcium 8.9 - 10.3 mg/dL 8.4  8.9  8.7        NUTRITION - FOCUSED PHYSICAL EXAM:  Flowsheet Row Most Recent Value  Orbital Region No depletion  Upper Arm Region No depletion  Thoracic and Lumbar Region No depletion  Buccal Region Mild depletion  Temple Region No depletion  Clavicle Bone Region Mild depletion  Clavicle and Acromion Bone Region Mild depletion  Scapular Bone Region No depletion  Dorsal Hand No depletion  Patellar Region No depletion  Posterior Calf  Region Mild depletion  Edema (RD Assessment) Mild  Hair Reviewed  Eyes Reviewed  Mouth Reviewed  Skin Reviewed  Nails Reviewed       Diet Order:   Diet Order             Diet regular Room service appropriate? Yes; Fluid consistency: Thin  Diet effective now                   EDUCATION NEEDS:  Education needs have been addressed  Skin:  Skin Assessment: Reviewed RN Assessment  Last BM:  8/15  Height:   Ht Readings from Last 1 Encounters:  02/02/22 5\' 4"  (1.626 m)    Weight:   Wt Readings from Last 1 Encounters:  02/03/22 82.8 kg    Ideal Body Weight:   55 kg  BMI:  Body mass index is 31.33 kg/m.  Estimated Nutritional Needs:   Kcal:  1500-1700  Protein:  67-75 gr  Fluid:  1600 ml daily  MS,RD,CSG,LDN Contact: 02/05/22

## 2022-02-03 NOTE — Plan of Care (Signed)
Walked with PT, sitting in chair with legs elevated ate all of breakfast. No c/o pain, nausea or SOB on RA. Seen by nephrology, hospitalist going to be downgraded to Med/Surg later today. Family by side, continue to monitor.  Problem: Fluid Volume: Goal: Hemodynamic stability will improve Outcome: Progressing   Problem: Clinical Measurements: Goal: Diagnostic test results will improve Outcome: Progressing Goal: Signs and symptoms of infection will decrease Outcome: Progressing   Problem: Respiratory: Goal: Ability to maintain adequate ventilation will improve Outcome: Progressing   Problem: Education: Goal: Knowledge of General Education information will improve Description: Including pain rating scale, medication(s)/side effects and non-pharmacologic comfort measures Outcome: Progressing   Problem: Health Behavior/Discharge Planning: Goal: Ability to manage health-related needs will improve Outcome: Progressing   Problem: Clinical Measurements: Goal: Ability to maintain clinical measurements within normal limits will improve Outcome: Progressing Goal: Will remain free from infection Outcome: Progressing Goal: Diagnostic test results will improve Outcome: Progressing Goal: Respiratory complications will improve Outcome: Progressing Goal: Cardiovascular complication will be avoided Outcome: Progressing   Problem: Activity: Goal: Risk for activity intolerance will decrease Outcome: Progressing   Problem: Nutrition: Goal: Adequate nutrition will be maintained Outcome: Progressing   Problem: Coping: Goal: Level of anxiety will decrease Outcome: Progressing   Problem: Elimination: Goal: Will not experience complications related to bowel motility Outcome: Progressing Goal: Will not experience complications related to urinary retention Outcome: Progressing   Problem: Pain Managment: Goal: General experience of comfort will improve Outcome: Progressing   Problem:  Safety: Goal: Ability to remain free from injury will improve Outcome: Progressing   Problem: Skin Integrity: Goal: Risk for impaired skin integrity will decrease Outcome: Progressing

## 2022-02-03 NOTE — Progress Notes (Signed)
PROGRESS NOTE    Patient: Patricia [name]                            PCP: Sharilyn Sites, MD                    DOB: 1950-07-12            DOA: 02/01/2022 [id number]             DOS: 02/03/2022, 9:36 AM   LOS: 2 days   Date of Service: The patient was seen and examined on 02/03/2022  Subjective:   The patient was seen and examined this morning, awake alert oriented no acute distress Status post cystoscopy and bilateral double-J stent placement by Dr. Alyson Ingles yesterday.  Patient tolerated procedure well Hemodynamically stable  Brief Narrative:   Patricia [name] is a 71 y.o. female with medical history significant of diabetes mellitus type 2, hypertension, hypothyroidism, and more presents the ED with a chief complaint of hypotension.  Unfortunately patient is not able to provide much history.  She knows herself and where she is at, but when asked the year she is peds her full name.  She reports she had been "down there."  When you try to clarify exact location, she reports she is not really sure.  She is also unable to characterize the pain due to not being "really sure." Patient was hypotensive at presentation.  She was given 2 L bolus, and admission was requested for dehydration.  With her significant AKI with a creatinine up to 5.33, Patricia [name] recommended CT abdomen pelvis.  It did show an obstructing stone.  UA also was indicative of UTI.  Zosyn was started at admission.  Urology was consulted and recommends admission to [facility] for stent placement    Assessment & Plan:   Principal Problem:   Sepsis ([place]) Active Problems:   Calculus of kidney   AKI (acute kidney injury) ([place])   Hyperkalemia   UTI (urinary tract infection)   Hypothyroidism   Essential hypertension     Assessment and Plan: * Sepsis ([place]) -Much improved sepsis physiology, afebrile, normotensive, improved leukocytosis, improved kidney function   Met sepsis criteria on admission-source of infection UTI,  obstructive -  WBC 13.1, RR 20, BP 75/36, AKI, UTI -Cephalosporin allergy -Recently on Cipro outpatient -Continue IV antibiotics of Zosyn -Urine culture >>> -Blood culture >>>  -Procalcitonin 0.12  -Continuing IV hydration, IV antibiotics, close monitoring at stepdown unit at [place] hypertension - Continue diltiazem, hold olmesartan due to AKI -As needed hydralazine  Hypothyroidism - Continue Synthroid  UTI (urinary tract infection) Started on Cipro outpatient Cephalosporin allergy Continue Zosyn  Hyperkalemia - Secondary to AKI -Calcium gluconate, insulin, 2 L normal saline, Lokelma given in the ED -Potassium has improved from 6.0-5.3 >>> 5.2>>4.7 -Monitor on telemetry --Continue to trend, monitoring electrolytes  AKI (acute kidney injury) ([place]) -Due to obstructive uropathy, status post bilateral stent placement 02/02/2022 -Improving BUN/creatinine - Creatinine up to 5.33 from 1.5 >>> 4.54 >> 2.61 -Nephrology consulted, appreciate input  -Associated hyperkalemia and decreased bicarb down to 11 -CT abdomen shows obstructing calculus in the distal right ureter with right-sided hydronephrosis -Discussed the with Dr. Alyson Ingles   -Patient was given 2 L bolus, continue maintenance IV fluid -Continue IV hydration -Hold NSAIDs -Continue to monitor  Calculus of kidney - Located in the distal right ureter -Right-sided hydronephrosis -Discussed the case with urologist  Dr. Alyson Ingles   -02/02/2022:  status post cystoscopy and bilateral double-J stent placement by Dr. Alyson Ingles   Patient tolerated procedure well -Foley catheter in place   ----------------------------------------------------------------------------------------------------------------------------------------------- Cultures; Blood Cultures x 2 >> NGT Urine Culture  >>> NGT   -------------------------------------------------------------------------------------------------------------------------------------------  DVT prophylaxis:  heparin injection 5,000 Units Start: 02/02/22 0600 SCDs Start: 02/02/22 0014   Code Status:   Code Status: Full Code  Family Communication: Daughter at bedside updated The above findings and plan of care has been discussed with patient (and family)  in detail,  they expressed understanding and agreement of above. -Advance care planning has been discussed.   Admission status:   Status is: Inpatient Remains inpatient appropriate because: Needing urological intervention, bilateral stenting, IV fluids, IV antibiotics     Procedures:   No admission procedures for hospital encounter.   Antimicrobials:  Anti-infectives (From admission, onward)    Start     Dose/Rate Route Frequency Ordered Stop   02/02/22 1800  piperacillin-tazobactam (ZOSYN) IVPB 3.375 g  Status:  Discontinued        3.375 g 12.5 mL/hr over 240 Minutes Intravenous Every 12 hours 02/02/22 0820 02/02/22 1424   02/02/22 1800  piperacillin-tazobactam (ZOSYN) IVPB 3.375 g        3.375 g 12.5 mL/hr over 240 Minutes Intravenous Every 12 hours 02/02/22 1424     02/01/22 2300  piperacillin-tazobactam (ZOSYN) IVPB 3.375 g  Status:  Discontinued        3.375 g 100 mL/hr over 30 Minutes Intravenous Every 8 hours 02/01/22 2246 02/02/22 0820        Medication:   Chlorhexidine Gluconate Cloth  6 each Topical Daily   citalopram  20 mg Oral Daily   fesoterodine  4 mg Oral Daily   heparin  5,000 Units Subcutaneous Q8H   levothyroxine  175 mcg Oral Daily   pregabalin  75 mg Oral BID   simvastatin  20 mg Oral Daily    acetaminophen **OR** acetaminophen, ondansetron **OR** ondansetron (ZOFRAN) IV, oxyCODONE   Objective:   Vitals:   02/03/22 0500 02/03/22 0600 02/03/22 0700 02/03/22 0717  BP: (!) 132/53 (!) 121/47 (!) 134/59   Pulse: 77 62 68 71  Resp: '18  18 16 18  ' Temp:  97.9 F (36.6 C)  (!) 97.5 F (36.4 C)  TempSrc:  Axillary  Oral  SpO2: 98% 98% 99% 99%  Weight: 82.8 kg     Height:        Intake/Output Summary (Last 24 hours) at 02/03/2022 0936 Last data filed at 02/03/2022 0515 Gross per 24 hour  Intake 765.53 ml  Output 1727 ml  Net -961.47 ml   Filed Weights   02/01/22 1426 02/02/22 1427 02/03/22 0500  Weight: 83.5 kg 82.6 kg 82.8 kg     Examination:   Physical Exam  Constitution: Much more awake and alert today , cooperative, no distress,  Appears calm and comfortable  Psychiatric:   Normal and stable mood and affect, cognition intact,   HEENT:        Normocephalic, PERRL, otherwise with in Normal limits  Chest:         Chest symmetric Cardio vascular:  S1/S2, RRR, No murmure, No Rubs or Gallops  pulmonary: Clear to auscultation bilaterally, respirations unlabored, negative wheezes / crackles Abdomen: Soft, non-tender, non-distended, bowel sounds,no masses, no organomegaly Muscular skeletal: Limited exam - in bed, able to move all 4 extremities,   Neuro: CNII-XII intact. , normal motor and sensation, reflexes intact  Extremities: No pitting edema lower extremities, +2 pulses  Skin: Dry, warm to touch, negative for any Rashes, No open wounds Wounds: per nursing documentation Foley cath in place   ------------------------------------------------------------------------------------------------------------------------------------------    LABs:     Latest Ref Rng & Units 02/03/2022    5:00 AM 02/02/2022   12:24 AM 02/01/2022    2:51 PM  CBC  WBC 4.0 - 10.5 K/uL 8.1  9.6  13.1   Hemoglobin 12.0 - 15.0 g/dL 10.6  11.8  12.8   Hematocrit 36.0 - 46.0 % 32.9  38.8  40.0   Platelets 150 - 400 K/uL 216  255  367       Latest Ref Rng & Units 02/03/2022    5:00 AM 02/02/2022   12:24 AM 02/01/2022   11:00 PM  CMP  Glucose 70 - 99 mg/dL 117  102  123   BUN 8 - 23 mg/dL 42  48  50   Creatinine 0.44 - 1.00 mg/dL 2.61   4.54  4.84   Sodium 135 - 145 mmol/L 139  137  136   Potassium 3.5 - 5.1 mmol/L 4.7  5.2  5.4   Chloride 98 - 111 mmol/L 121  117  117   CO2 22 - 32 mmol/L '15  14  12   ' Calcium 8.9 - 10.3 mg/dL 8.4  8.9  8.7   Total Protein 6.5 - 8.1 g/dL 5.7  6.3    Total Bilirubin 0.3 - 1.2 mg/dL 0.4  0.3    Alkaline Phos 38 - 126 U/L 46  54    AST 15 - 41 U/L 62  23    ALT 0 - 44 U/L 34  23         Micro Results Recent Results (from the past 240 hour(s))  SARS Coronavirus 2 by RT PCR (hospital order, performed in [place] hospital lab) *cepheid single result test* Anterior Nasal Swab     Status: None   Collection Time: 02/01/22  6:23 PM   Specimen: Anterior Nasal Swab  Result Value Ref Range Status   SARS Coronavirus 2 by RT PCR NEGATIVE NEGATIVE Final    Comment: (NOTE) SARS-CoV-2 target nucleic acids are NOT DETECTED.  The SARS-CoV-2 RNA is generally detectable in upper and lower respiratory specimens during the acute phase of infection. The lowest concentration of SARS-CoV-2 viral copies this assay can detect is 250 copies / mL. A negative result does not preclude SARS-CoV-2 infection and should not be used as the sole basis for treatment or other patient management decisions.  A negative result may occur with improper specimen collection / handling, submission of specimen other than nasopharyngeal swab, presence of viral mutation(s) within the areas targeted by this assay, and inadequate number of viral copies (<250 copies / mL). A negative result must be combined with clinical observations, patient history, and epidemiological information.  Fact Sheet for Patients:   https://www.patel.info/  Fact Sheet for Healthcare Providers: https://hall.com/  This test is not yet approved or  cleared by the [place] FDA and has been authorized for detection and/or diagnosis of SARS-CoV-2 by FDA under an Emergency Use Authorization (EUA).  This  EUA will remain in effect (meaning this test can be used) for the duration of the COVID-19 declaration under Section 564(b)(1) of the Act, 21 U.S.C. section 360bbb-3(b)(1), unless the authorization is terminated or revoked sooner.  Performed at [facility], [address] [place], [place] [address]   Culture, blood (Routine X 2)  w Reflex to ID Panel     Status: None (Preliminary result)   Collection Time: 02/02/22 12:24 AM   Specimen: BLOOD  Result Value Ref Range Status   Specimen Description BLOOD RIGHT THUMB  Final   Special Requests   Final    BOTTLES DRAWN AEROBIC AND ANAEROBIC Blood Culture adequate volume   Culture   Final    NO GROWTH 1 DAY Performed at [facility], [address], [place], [place] [address]    Report Status PENDING  Incomplete  Culture, blood (Routine X 2) w Reflex to ID Panel     Status: None (Preliminary result)   Collection Time: 02/02/22 12:24 AM   Specimen: BLOOD  Result Value Ref Range Status   Specimen Description BLOOD BLOOD LEFT FOREARM  Final   Special Requests   Final    BOTTLES DRAWN AEROBIC ONLY Blood Culture adequate volume   Culture   Final    NO GROWTH 1 DAY Performed at [facility], [address], [place], [place] [address]    Report Status PENDING  Incomplete  MRSA Next Gen by PCR, Nasal     Status: None   Collection Time: 02/02/22  2:45 PM   Specimen: Nasal Mucosa; Nasal Swab  Result Value Ref Range Status   MRSA by PCR Next Gen NOT DETECTED NOT DETECTED Final    Comment: (NOTE) The GeneXpert MRSA Assay (FDA approved for NASAL specimens only), is one component of a comprehensive MRSA colonization surveillance program. It is not intended to diagnose MRSA infection nor to guide or monitor treatment for MRSA infections. Test performance is not FDA approved in patients less than 47 years old. Performed at [facility], [address], [place], [place] [address]     Radiology Reports DG C-Arm 1-60 Min-No Report  Result Date:  02/02/2022 Fluoroscopy was utilized by the requesting physician.  No radiographic interpretation.    SIGNED: Deatra James, MD, FHM. [facility],  Pager (please use [email or web address] to page/text) Please use Epic Secure Chat for non-urgent communication (7AM-7PM)  If 7PM-7AM, please contact night-coverage [email or web address], 02/03/2022, 9:36 AM

## 2022-02-04 LAB — CBC
HCT: 36.1 % (ref 36.0–46.0)
Hemoglobin: 11.7 g/dL — ABNORMAL LOW (ref 12.0–15.0)
MCH: 29.8 pg (ref 26.0–34.0)
MCHC: 32.4 g/dL (ref 30.0–36.0)
MCV: 91.9 fL (ref 80.0–100.0)
Platelets: 224 10*3/uL (ref 150–400)
RBC: 3.93 MIL/uL (ref 3.87–5.11)
RDW: 14.8 % (ref 11.5–15.5)
WBC: 9.1 10*3/uL (ref 4.0–10.5)
nRBC: 0 % (ref 0.0–0.2)

## 2022-02-04 LAB — COMPREHENSIVE METABOLIC PANEL
ALT: 32 U/L (ref 0–44)
AST: 40 U/L (ref 15–41)
Albumin: 3 g/dL — ABNORMAL LOW (ref 3.5–5.0)
Alkaline Phosphatase: 47 U/L (ref 38–126)
Anion gap: 5 (ref 5–15)
BUN: 32 mg/dL — ABNORMAL HIGH (ref 8–23)
CO2: 16 mmol/L — ABNORMAL LOW (ref 22–32)
Calcium: 8.8 mg/dL — ABNORMAL LOW (ref 8.9–10.3)
Chloride: 120 mmol/L — ABNORMAL HIGH (ref 98–111)
Creatinine, Ser: 1.66 mg/dL — ABNORMAL HIGH (ref 0.44–1.00)
GFR, Estimated: 33 mL/min — ABNORMAL LOW (ref >60)
Glucose, Bld: 109 mg/dL — ABNORMAL HIGH (ref 70–99)
Potassium: 4.5 mmol/L (ref 3.5–5.1)
Sodium: 141 mmol/L (ref 135–145)
Total Bilirubin: 0.8 mg/dL (ref 0.3–1.2)
Total Protein: 6 g/dL — ABNORMAL LOW (ref 6.5–8.1)

## 2022-02-04 LAB — GLUCOSE, CAPILLARY
Glucose-Capillary: 120 mg/dL — ABNORMAL HIGH (ref 70–99)
Glucose-Capillary: 87 mg/dL (ref 70–99)
Glucose-Capillary: 95 mg/dL (ref 70–99)
Glucose-Capillary: 97 mg/dL (ref 70–99)

## 2022-02-04 MED ORDER — TRAZODONE HCL 50 MG PO TABS
50.0000 mg | ORAL_TABLET | Freq: Every day | ORAL | Status: DC
  Administered 2022-02-04: 50 mg via ORAL
  Filled 2022-02-04: qty 1

## 2022-02-04 MED ORDER — PIPERACILLIN-TAZOBACTAM 3.375 G IVPB
3.3750 g | Freq: Three times a day (TID) | INTRAVENOUS | Status: DC
  Administered 2022-02-04 – 2022-02-05 (×3): 3.375 g via INTRAVENOUS
  Filled 2022-02-04 (×3): qty 50

## 2022-02-04 MED ORDER — LACTATED RINGERS IV SOLN: INTRAVENOUS | Status: DC

## 2022-02-04 NOTE — TOC Progression Note (Signed)
Transition of Care [facility]) - Progression Note    Patient Details  Name: Patricia [name] MRN: [id number] Date of Birth: 04-19-1951  Transition of Care [facility]) CM/SW Contact  [place], [place] Phone Number: 02/04/2022, 11:06 AM  Clinical Narrative:    CSW spoke to Clifton Custard with [facility] who states they can accept [facility] PT and RN referral. CSW requested that MD place [facility] PT and RN orders. TOC to follow.   Expected Discharge Plan: Home w Home Health Services Barriers to Discharge: Continued Medical Work up  Expected Discharge Plan and Services Expected Discharge Plan: Home w Home Health Services     Post Acute Care Choice: Home Health Living arrangements for the past 2 months: Single Family Home                                       Social Determinants of Health (SDOH) Interventions    Readmission Risk Interventions     No data to display

## 2022-02-04 NOTE — Care Management Important Message (Signed)
Important Message  Patient Details  Name: Patricia [name] MRN: [id number] Date of Birth: 1950/10/29   Medicare Important Message Given:  Yes  Reviewed Medicare IM with patient via room phone [phone]).  Copy of Medicare IM placed in mail to home address on file.    Johnell Comings 02/04/2022, 1:01 PM

## 2022-02-04 NOTE — Progress Notes (Signed)
Patient ambulated with assistance twice with this nurse to the restroom. Patient had two loose stools after starting morning Zosyn IV.

## 2022-02-04 NOTE — Progress Notes (Signed)
Patient ID: Patricia [name], female   DOB: 11-23-1950, 71 y.o.   MRN: [id number] [facility] Progress Note   Assessment/ Plan:   1. Acute kidney Injury with likely normal renal baseline: Marginally polyuric overnight following management of obstructive etiology of her acute kidney injury.  She likely suffered due to injury from obstruction and hypotension while on ARB/meloxicam.  She is now status post bilateral double-J stents and labs indicate continued improvement of renal function.  Recommend switching maintenance intravenous fluids from normal saline to lactated Ringer's in order to provide a more balanced crystalloid with some bicarbonate.  Continue monitoring daily labs to assess for renal recovery.  No indications for renal replacement therapy or acute electrolyte abnormality requiring intervention. 2.  Right ureteral calculus with hydronephrosis/urinary tract infection: Status post bilateral double-J stent placement and ongoing antibiotic therapy with Zosyn. 3.  Anion gap metabolic acidosis: Secondary to acute kidney injury, will adjust intravenous fluids and switch from normal saline to lactated Ringer at 100 cc/h to replace polyuric losses. 4.  Hypertension: Blood pressures rising with intravenous fluids/off of olmesartan.  We will begin transient antihypertensive therapy with hydralazine 25 mg twice daily (switch back to ARB when kidney function is back to baseline/continues to trend down).  Renal service will sign off at this time and remain available for questions or concerns.  Anticipate that she will continue to show evidence of renal recovery and she will be followed with lab surveillance by her PCP especially after transitioned back to her ARB.  Subjective:   She reports that her Foley catheter was removed earlier and she has had no problems voiding urine.  Has been having diarrhea while on Zosyn.   Objective:   BP (!) 162/88 (BP Location: Left Arm)   Pulse 73   Temp  97.7 F (36.5 C) (Oral)   Resp 20   Ht 5\' 4"  (1.626 m)   Wt 82.8 kg   SpO2 99%   BMI 31.33 kg/m   Intake/Output Summary (Last 24 hours) at 02/04/2022 1123 Last data filed at 02/04/2022 1000 Gross per 24 hour  Intake 1611.33 ml  Output 4075 ml  Net -2463.67 ml   Weight change:   Physical Exam: Gen: Comfortably sitting up in recliner, sister at bedside CVS: Pulse regular rhythm, normal rate, S1 and S2 normal Resp: Clear to auscultation, no rales/rhonchi Abd: Soft, flat, nontender, bowel sounds normal Ext: Trace ankle edema  Imaging: DG C-Arm 1-60 Min-No Report  Result Date: 02/02/2022 Fluoroscopy was utilized by the requesting physician.  No radiographic interpretation.    Labs: BMET Recent Labs  Lab 01/30/22 1215 02/01/22 1451 02/01/22 2300 02/02/22 0024 02/03/22 0500 02/04/22 0451  NA 140 137 136 137 139 141  K 5.4* 6.0* 5.4* 5.2* 4.7 4.5  CL 113* 116* 117* 117* 121* 120*  CO2 19* 11* 12* 14* 15* 16*  GLUCOSE 147* 134* 123* 102* 117* 109*  BUN 20 49* 50* 48* 42* 32*  CREATININE 1.56* 5.33* 4.84* 4.54* 2.61* 1.66*  CALCIUM 10.9* 9.8 8.7* 8.9 8.4* 8.8*   CBC Recent Labs  Lab 02/01/22 1451 02/02/22 0024 02/03/22 0500 02/04/22 0451  WBC 13.1* 9.6 8.1 9.1  NEUTROABS 9.5* 5.2  --   --   HGB 12.8 11.8* 10.6* 11.7*  HCT 40.0 38.8 32.9* 36.1  MCV 93.0 96.8 92.2 91.9  PLT 367 255 216 224    Medications:     Chlorhexidine Gluconate Cloth  6 each Topical Daily   citalopram  20 mg  Oral Daily   fesoterodine  4 mg Oral Daily   heparin  5,000 Units Subcutaneous Q8H   levothyroxine  175 mcg Oral Daily   pregabalin  75 mg Oral BID   simvastatin  20 mg Oral Daily   traZODone  50 mg Oral QHS    Zetta Bills, MD 02/04/2022, 11:23 AM

## 2022-02-04 NOTE — Progress Notes (Signed)
NT ambulated pt in the hall with walker. Pt tolerated well and denies any SOB, or feeling weak. PT was able to walk from her room around the nurses station and back.

## 2022-02-04 NOTE — Progress Notes (Signed)
Foley removed Per MD request without complication. was emptied peri care was completed. PT tolerated well and denies any pain at this time

## 2022-02-05 DIAGNOSIS — A419 Sepsis, unspecified organism: Secondary | ICD-10-CM | POA: Diagnosis not present

## 2022-02-05 LAB — CBC
HCT: 32.2 % — ABNORMAL LOW (ref 36.0–46.0)
Hemoglobin: 10.9 g/dL — ABNORMAL LOW (ref 12.0–15.0)
MCH: 29.6 pg (ref 26.0–34.0)
MCHC: 33.9 g/dL (ref 30.0–36.0)
MCV: 87.5 fL (ref 80.0–100.0)
Platelets: 246 10*3/uL (ref 150–400)
RBC: 3.68 MIL/uL — ABNORMAL LOW (ref 3.87–5.11)
RDW: 14.9 % (ref 11.5–15.5)
WBC: 7.8 10*3/uL (ref 4.0–10.5)
nRBC: 0 % (ref 0.0–0.2)

## 2022-02-05 LAB — GLUCOSE, CAPILLARY: Glucose-Capillary: 97 mg/dL (ref 70–99)

## 2022-02-05 LAB — COMPREHENSIVE METABOLIC PANEL
ALT: 28 U/L (ref 0–44)
AST: 26 U/L (ref 15–41)
Albumin: 3.1 g/dL — ABNORMAL LOW (ref 3.5–5.0)
Alkaline Phosphatase: 47 U/L (ref 38–126)
Anion gap: 7 (ref 5–15)
BUN: 28 mg/dL — ABNORMAL HIGH (ref 8–23)
CO2: 18 mmol/L — ABNORMAL LOW (ref 22–32)
Calcium: 9 mg/dL (ref 8.9–10.3)
Chloride: 117 mmol/L — ABNORMAL HIGH (ref 98–111)
Creatinine, Ser: 1.47 mg/dL — ABNORMAL HIGH (ref 0.44–1.00)
GFR, Estimated: 38 mL/min — ABNORMAL LOW (ref >60)
Glucose, Bld: 117 mg/dL — ABNORMAL HIGH (ref 70–99)
Potassium: 4 mmol/L (ref 3.5–5.1)
Sodium: 142 mmol/L (ref 135–145)
Total Bilirubin: 0.5 mg/dL (ref 0.3–1.2)
Total Protein: 6.3 g/dL — ABNORMAL LOW (ref 6.5–8.1)

## 2022-02-05 LAB — URINE CULTURE: Culture: >=100000 — AB

## 2022-02-05 MED ORDER — LACTINEX PO CHEW: 1.0000 | CHEWABLE_TABLET | Freq: Three times a day (TID) | ORAL | 0 refills | Status: AC

## 2022-02-05 MED ORDER — SULFAMETHOXAZOLE-TRIMETHOPRIM 800-160 MG PO TABS
1.0000 | ORAL_TABLET | Freq: Two times a day (BID) | ORAL | 0 refills | Status: AC
Start: 2022-02-05 — End: 2022-02-10

## 2022-02-05 NOTE — Progress Notes (Signed)
Patient discharging and in need of walker. Spoke with daughter agreeable to walker being ordered via [employer]. Patient provided walker from [employer]. Referral made to [facility] with [employer].     Emoni Whitworth, Juleen China, LCSW

## 2022-02-05 NOTE — Discharge Summary (Signed)
Physician Discharge Summary   Patient: Patricia [name] MRN: [id number] DOB: 12/23/1950  Admit date:     02/01/2022  Discharge date: 02/05/22  Discharge Physician: Deatra James   PCP: Sharilyn Sites, MD   Recommendations at discharge:  Follow-up with urologist Dr. Alyson Ingles in 1 week Follow-up with a PCP in 2-3 weeks Follow culture, continue home health  Discharge Diagnoses: Principal Problem:   Sepsis ([place]) Active Problems:   Calculus of kidney   AKI (acute kidney injury) ([place])   Hyperkalemia   UTI (urinary tract infection)   Hypothyroidism   Essential hypertension  Resolved Problems:   * No resolved hospital problems. *  Hospital Course: KANITA [name] is a 71 y.o. female with medical history significant of diabetes mellitus type 2, hypertension, hypothyroidism, and more presents the ED with a chief complaint of hypotension.  Unfortunately patient is not able to provide much history.  She knows herself and where she is at, but when asked the year she is peds her full name.  She reports she had been "down there."  When you try to clarify exact location, she reports she is not really sure.  She is also unable to characterize the pain due to not being "really sure." Patient was hypotensive at presentation.  She was given 2 L bolus, and admission was requested for dehydration.  With her significant AKI with a creatinine up to 5.33, Kelly recommended CT abdomen pelvis.  It did show an obstructing stone.  UA also was indicative of UTI.  Zosyn was started at admission.  Urology was consulted and recommends admission to [facility] for stent placement   * Sepsis ([place]) - Resolved -Much improved sepsis physiology, afebrile, normotensive, improved leukocytosis, improved kidney function   Met sepsis criteria on admission-source of infection UTI, obstructive -  WBC 13.1, RR 20, BP 75/36, AKI, UTI -Cephalosporin allergy -Recently on Cipro outpatient -Continue IV antibiotics of  Zosyn -Urine culture >>> no growth to date -Blood culture >>> no growth to date -Procalcitonin 0.12  -S/p IV hydration, IV antibiotics, c -IV antibiotics Zosyn discontinued 02/05/2022 switch to p.o. Bactrim double strength for 5 more days  Essential hypertension - Continue diltiazem, discontinued Olmesartan due to AKI -BP remained stable  Hypothyroidism - Continue Synthroid  UTI (urinary tract infection) Was on on Cipro as outpatient on admission Cephalosporin allergy -IV antibiotics Zosyn discontinued 02/05/2022, started on p.o. Bactrim  Hyperkalemia - Secondary to AKI -Calcium gluconate, insulin, 2 L normal saline, Lokelma given in the ED -Potassium has improved from 6.0-5.3 >>> 5.2>>4.7 >> 4.0   --Continue to trend, monitoring electrolytes  AKI (acute kidney injury) ([place]) -Due to obstructive uropathy, status post bilateral stent placement 02/02/2022 -Improving BUN/creatinine -On admission creatinine up to 5.33 from 1.5 >>> 4.54 >> 2.61 >>>1.47 Lab Results  Component Value Date   CREATININE 1.47 (H) 02/05/2022   CREATININE 1.66 (H) 02/04/2022   CREATININE 2.61 (H) 02/03/2022    -Nephrology consulted, appreciate input  -Associated hyperkalemia and decreased bicarb down to 11 -CT abdomen shows obstructing calculus in the distal right ureter with right-sided hydronephrosis -Discussed the with Dr. Alyson Ingles   -Status post aggressive IV fluid resuscitation -Discontinued home medication of Benicar for now -Discontinued and avoid NSAIDs   Calculus of kidney - Was Located in the distal right ureter -Right-sided hydronephrosis -Discussed the case with urologist Dr. Alyson Ingles   -02/02/2022:  status post cystoscopy and bilateral double-J stent placement by Dr. Alyson Ingles   Patient tolerated procedure well -Foley catheter  in place>>> was discontinued 02/04/2022        Consultants: Urologist Dr. Alyson Ingles Procedures performed: Cystoscopy and bilateral JJ -ureteral stent  placement Disposition: Home health Diet recommendation:  Discharge Diet Orders (From admission, onward)     Start     Ordered   02/05/22 0000  Diet - low sodium heart healthy        02/05/22 0717   02/04/22 0000  Diet - low sodium heart healthy        02/04/22 1111           Regular diet DISCHARGE MEDICATION: Allergies as of 02/05/2022       Reactions   Amlodipine    Swelling   Keflex [cephalexin] Rash        Medication List     STOP taking these medications    ciprofloxacin 500 MG tablet Commonly known as: CIPRO   cyclobenzaprine 10 MG tablet Commonly known as: FLEXERIL   olmesartan 40 MG tablet Commonly known as: BENICAR       TAKE these medications    APPLE CIDER VINEGAR PO Take 1 tablet by mouth daily.   cetirizine 10 MG tablet Commonly known as: ZYRTEC Take 10 mg by mouth daily.   CINNAMON PO Take 1,000 mg by mouth daily.   Dilt-XR 180 MG 24 hr capsule Generic drug: diltiazem Take 180 mg by mouth daily.   escitalopram 5 MG tablet Commonly known as: LEXAPRO Take 5 mg by mouth daily.   Fish Oil 1000 MG Caps Take 1 capsule by mouth daily.   lactobacillus acidophilus & bulgar chewable tablet Chew 1 tablet by mouth 3 (three) times daily with meals for 7 days.   levothyroxine 175 MCG tablet Commonly known as: SYNTHROID Take 175 mcg by mouth daily. What changed: Another medication with the same name was removed. Continue taking this medication, and follow the directions you see here.   meloxicam 15 MG tablet Commonly known as: MOBIC Take 15 mg by mouth as needed for pain.   metFORMIN 500 MG tablet Commonly known as: GLUCOPHAGE Take 500 mg by mouth 2 (two) times daily.   ondansetron 4 MG tablet Commonly known as: ZOFRAN Take 1 tablet (4 mg total) by mouth every 6 (six) hours.   pregabalin 200 MG capsule Commonly known as: LYRICA Take 200 mg by mouth 2 (two) times daily.   simvastatin 20 MG tablet Commonly known as: ZOCOR Take  20 mg by mouth daily.   solifenacin 10 MG tablet Commonly known as: VESICARE Take 1 tablet (10 mg total) by mouth daily.   sulfamethoxazole-trimethoprim 800-160 MG tablet Commonly known as: BACTRIM DS Take 1 tablet by mouth 2 (two) times daily for 5 days.   Tiadylt ER 360 MG 24 hr capsule Generic drug: diltiazem Take 360 mg by mouth daily.               Discharge Care Instructions  (From admission, onward)           Start     Ordered   02/05/22 0000  Discharge wound care:       Comments: Wound care Rn instructions   02/05/22 0717   02/04/22 0000  Discharge wound care:        02/04/22 1111            Follow-up Information     Health, [place] Follow up.   Specialty: [place] Why: [facility] agency will call within 48 hours of discharge to set up  [place] appointments. Contact information: [address] [place] [address] [phone]                Discharge Exam: Danley Danker Weights   02/01/22 1426 02/02/22 1427 02/03/22 0500  Weight: 83.5 kg 82.6 kg 82.8 kg      Physical Exam:   General:  AAO x 3,  cooperative, no distress;   HEENT:  Normocephalic, PERRL, otherwise with in Normal limits   Neuro:  CNII-XII intact. , normal motor and sensation, reflexes intact   Lungs:   Clear to auscultation BL, Respirations unlabored,  No wheezes / crackles  Cardio:    S1/S2, RRR, No murmure, No Rubs or Gallops   Abdomen:  Soft, non-tender, bowel sounds active all four quadrants, no guarding or peritoneal signs.  Muscular  skeletal:  Limited exam -global generalized weaknesses - in bed, able to move all 4 extremities,   2+ pulses,  symmetric, No pitting edema  Skin:  Dry, warm to touch, negative for any Rashes,  Wounds: Please see nursing documentation          Condition at discharge: good  The results of significant diagnostics from this hospitalization (including imaging, microbiology, ancillary and laboratory) are listed below  for reference.   Imaging Studies: DG C-Arm 1-60 Min-No Report  Result Date: 02/02/2022 Fluoroscopy was utilized by the requesting physician.  No radiographic interpretation.   DG Abd 1 View  Result Date: 02/02/2022 CLINICAL DATA:  Nephrolithiasis. EXAM: ABDOMEN - 1 VIEW COMPARISON:  Radiograph of February 04, 2020. CT scan February 01, 2022. FINDINGS: The bowel gas pattern is normal. No definite nephrolithiasis is noted. Phleboliths are noted in the pelvis. Large distal right ureteral calculus is noted as described on prior CT scan. IMPRESSION: Large distal right ureteral calculus is noted as described on prior CT scan. Electronically Signed   By: Marijo Conception M.D.   On: 02/02/2022 09:53   US RENAL  Result Date: 02/02/2022 CLINICAL DATA:  RIGHT ureteral calculus with trace RIGHT hydronephrosis on CT, diabetes mellitus, hypertension EXAM: RENAL / URINARY TRACT ULTRASOUND COMPLETE COMPARISON:  CT abdomen and pelvis 02/01/2022 FINDINGS: Right Kidney: Renal measurements: 13.2 x 6.6 x 6.3 cm = volume: 287 mL. Normal cortical echogenicity. Diffuse cortical thinning. No mass or hydronephrosis. No shadowing calcification. Left Kidney: Renal measurements: 12.6 x 7.1 x 6.7 cm = volume: 317 mL. Normal cortical thickness and echogenicity. No mass, hydronephrosis or shadowing calcification. Bladder: Distended urinary bladder, volume 781 mL. Ureteral jets were not visualized during the interval of imaging. No gross bladder mass or wall thickening. Other: N/A IMPRESSION: Distended urinary bladder. No evidence of renal mass or hydronephrosis. Electronically Signed   By: Lavonia Dana M.D.   On: 02/02/2022 08:45   CT ABDOMEN PELVIS WO CONTRAST  Result Date: 02/01/2022 CLINICAL DATA:  Nausea and vomiting. EXAM: CT ABDOMEN AND PELVIS WITHOUT CONTRAST TECHNIQUE: Multidetector CT imaging of the abdomen and pelvis was performed following the standard protocol without IV contrast. RADIATION DOSE REDUCTION: This exam was  performed according to the departmental dose-optimization program which includes automated exposure control, adjustment of the mA and/or kV according to patient size and/or use of iterative reconstruction technique. COMPARISON:  CT abdomen and pelvis 06/07/2019 FINDINGS: Lower chest: There is atelectasis in the lung bases. Hepatobiliary: Gallbladder surgically absent. No focal liver lesions. No biliary ductal dilatation. Pancreas: Unremarkable. No pancreatic ductal dilatation or surrounding inflammatory changes. Spleen: Normal in size without focal abnormality. Adrenals/Urinary Tract: There is likely  a 5 mm distal right ureteral calculus seen on image 2/84. There is trace right-sided hydronephrosis. The bladder and adrenal glands are within normal limits. There is a 2.7 cm cyst in the left kidney. Stomach/Bowel: Small hiatal hernia is present, unchanged. Stomach is otherwise within normal limits. Appendix appears normal. No evidence of bowel wall thickening, distention, or inflammatory changes. There is sigmoid colon diverticulosis. Vascular/Lymphatic: No significant vascular findings are present. No enlarged abdominal or pelvic lymph nodes. Reproductive: Status post hysterectomy. No adnexal masses. Other: No abdominal wall hernia or abnormality. No abdominopelvic ascites. Musculoskeletal: Mild compression deformity of the superior endplate of L3 appears unchanged. No acute fractures are identified. IMPRESSION: 1. 5 mm calculus in the distal right ureter. There is minimal right-sided hydronephrosis. 2. Small hiatal hernia. 3. Sigmoid colon diverticulosis. 4. 2.7 cm left Bosniak 2 benign cyst. No follow-up imaging is recommended. JACR 2018 Feb; 264-273, Management of the Incidental RenalMass on CT, RadioGraphics 2021; 814-848, Bosniak Classification of Cystic Renal Masses, Version 2019. Electronically Signed   By: Ronney Asters M.D.   On: 02/01/2022 20:43   CT Head Wo Contrast  Result Date: 02/01/2022 CLINICAL  DATA:  Head trauma, minor (Age >= 65y) EXAM: CT HEAD WITHOUT CONTRAST TECHNIQUE: Contiguous axial images were obtained from the base of the skull through the vertex without intravenous contrast. RADIATION DOSE REDUCTION: This exam was performed according to the departmental dose-optimization program which includes automated exposure control, adjustment of the mA and/or kV according to patient size and/or use of iterative reconstruction technique. COMPARISON:  None Available. FINDINGS: Brain: There is no acute intracranial hemorrhage, mass effect, or edema. Gray-white differentiation is preserved. There is no extra-axial fluid collection. Ventricles and sulci are within normal limits in size and configuration. Vascular: There is atherosclerotic calcification at the skull base. Skull: Calvarium is unremarkable. Sinuses/Orbits: No acute finding. Other: None. IMPRESSION: No acute intracranial abnormality. Electronically Signed   By: Macy Mis M.D.   On: 02/01/2022 15:19    Microbiology: Results for orders placed or performed during the hospital encounter of 02/01/22  SARS Coronavirus 2 by RT PCR (hospital order, performed in [facility] hospital lab) *cepheid single result test* Anterior Nasal Swab     Status: None   Collection Time: 02/01/22  6:23 PM   Specimen: Anterior Nasal Swab  Result Value Ref Range Status   SARS Coronavirus 2 by RT PCR NEGATIVE NEGATIVE Final    Comment: (NOTE) SARS-CoV-2 target nucleic acids are NOT DETECTED.  The SARS-CoV-2 RNA is generally detectable in upper and lower respiratory specimens during the acute phase of infection. The lowest concentration of SARS-CoV-2 viral copies this assay can detect is 250 copies / mL. A negative result does not preclude SARS-CoV-2 infection and should not be used as the sole basis for treatment or other patient management decisions.  A negative result may occur with improper specimen collection / handling, submission of specimen  other than nasopharyngeal swab, presence of viral mutation(s) within the areas targeted by this assay, and inadequate number of viral copies (<250 copies / mL). A negative result must be combined with clinical observations, patient history, and epidemiological information.  Fact Sheet for Patients:   https://www.patel.info/  Fact Sheet for Healthcare Providers: https://hall.com/  This test is not yet approved or  cleared by the [place] FDA and has been authorized for detection and/or diagnosis of SARS-CoV-2 by FDA under an Emergency Use Authorization (EUA).  This EUA will remain in effect (meaning this test can be used)  for the duration of the COVID-19 declaration under Section 564(b)(1) of the Act, 21 U.S.C. section 360bbb-3(b)(1), unless the authorization is terminated or revoked sooner.  Performed at [facility], [address] [place], [place] [address]   Urine Culture     Status: None (Preliminary result)   Collection Time: 02/01/22 10:45 PM   Specimen: Urine, Clean Catch  Result Value Ref Range Status   Specimen Description   Final    URINE, CLEAN CATCH Performed at [facility], [address], [place], [place] [address]    Special Requests   Final    NONE Performed at [facility], [address], [place], [place] [address]    Culture   Final    CULTURE REINCUBATED FOR BETTER GROWTH Performed at [facility], [place] [address], [place], [place] [address]    Report Status PENDING  Incomplete  Culture, blood (Routine X 2) w Reflex to ID Panel     Status: None (Preliminary result)   Collection Time: 02/02/22 12:24 AM   Specimen: BLOOD  Result Value Ref Range Status   Specimen Description BLOOD RIGHT THUMB  Final   Special Requests   Final    BOTTLES DRAWN AEROBIC AND ANAEROBIC Blood Culture adequate volume   Culture   Final    NO GROWTH 3 DAYS Performed at [facility], [address], [place], [place] [address]     Report Status PENDING  Incomplete  Culture, blood (Routine X 2) w Reflex to ID Panel     Status: None (Preliminary result)   Collection Time: 02/02/22 12:24 AM   Specimen: BLOOD  Result Value Ref Range Status   Specimen Description BLOOD BLOOD LEFT FOREARM  Final   Special Requests   Final    BOTTLES DRAWN AEROBIC ONLY Blood Culture adequate volume   Culture   Final    NO GROWTH 3 DAYS Performed at [facility], [address], [place], [place] [address]    Report Status PENDING  Incomplete  MRSA Next Gen by PCR, Nasal     Status: None   Collection Time: 02/02/22  2:45 PM   Specimen: Nasal Mucosa; Nasal Swab  Result Value Ref Range Status   MRSA by PCR Next Gen NOT DETECTED NOT DETECTED Final    Comment: (NOTE) The GeneXpert MRSA Assay (FDA approved for NASAL specimens only), is one component of a comprehensive MRSA colonization surveillance program. It is not intended to diagnose MRSA infection nor to guide or monitor treatment for MRSA infections. Test performance is not FDA approved in patients less than 28 years old. Performed at [facility], [address], [place],  [address]     Labs: CBC: Recent Labs  Lab 02/01/22 1451 02/02/22 0024 02/03/22 0500 02/04/22 0451 02/05/22 0615  WBC 13.1* 9.6 8.1 9.1 7.8  NEUTROABS 9.5* 5.2  --   --   --   HGB 12.8 11.8* 10.6* 11.7* 10.9*  HCT 40.0 38.8 32.9* 36.1 32.2*  MCV 93.0 96.8 92.2 91.9 87.5  PLT 367 255 216 224 725   Basic Metabolic Panel: Recent Labs  Lab 02/01/22 2300 02/02/22 0024 02/03/22 0500 02/04/22 0451 02/05/22 0615  NA 136 137 139 141 142  K 5.4* 5.2* 4.7 4.5 4.0  CL 117* 117* 121* 120* 117*  CO2 12* 14* 15* 16* 18*  GLUCOSE 123* 102* 117* 109* 117*  BUN 50* 48* 42* 32* 28*  CREATININE 4.84* 4.54* 2.61* 1.66* 1.47*  CALCIUM 8.7* 8.9 8.4* 8.8* 9.0  MG  --  1.8  --   --   --    Liver Function Tests: Recent Labs  Lab 02/01/22 1451 02/02/22 0024 02/03/22 0500 02/04/22 0451 02/05/22 0615   AST 25 23 62* 40 26  ALT 28 23 34 32 28  ALKPHOS 64 54 46 47 47  BILITOT 0.3 0.3 0.4 0.8 0.5  PROT 7.3 6.3* 5.7* 6.0* 6.3*  ALBUMIN 4.0 3.4* 2.9* 3.0* 3.1*   CBG: Recent Labs  Lab 02/04/22 0003 02/04/22 0518 02/04/22 1143 02/04/22 1702 02/05/22 0137  GLUCAP 120* 97 95 87 97    Discharge time spent: greater than 30 minutes.  Signed: Deatra James, MD [facility] 02/05/2022

## 2022-02-07 ENCOUNTER — Encounter (HOSPITAL_COMMUNITY): Payer: Self-pay | Admitting: Urology

## 2022-02-07 LAB — CULTURE, BLOOD (ROUTINE X 2)
Culture: NO GROWTH
Culture: NO GROWTH
Special Requests: ADEQUATE
Special Requests: ADEQUATE

## 2022-02-08 DIAGNOSIS — Z48816 Encounter for surgical aftercare following surgery on the genitourinary system: Secondary | ICD-10-CM | POA: Diagnosis not present

## 2022-02-08 DIAGNOSIS — I509 Heart failure, unspecified: Secondary | ICD-10-CM | POA: Diagnosis not present

## 2022-02-08 DIAGNOSIS — M1990 Unspecified osteoarthritis, unspecified site: Secondary | ICD-10-CM | POA: Diagnosis not present

## 2022-02-08 DIAGNOSIS — Z7984 Long term (current) use of oral hypoglycemic drugs: Secondary | ICD-10-CM | POA: Diagnosis not present

## 2022-02-08 DIAGNOSIS — E039 Hypothyroidism, unspecified: Secondary | ICD-10-CM | POA: Diagnosis not present

## 2022-02-08 DIAGNOSIS — N179 Acute kidney failure, unspecified: Secondary | ICD-10-CM | POA: Diagnosis not present

## 2022-02-08 DIAGNOSIS — I110 Hypertensive heart disease with heart failure: Secondary | ICD-10-CM | POA: Diagnosis not present

## 2022-02-08 DIAGNOSIS — N136 Pyonephrosis: Secondary | ICD-10-CM | POA: Diagnosis not present

## 2022-02-08 DIAGNOSIS — N200 Calculus of kidney: Secondary | ICD-10-CM | POA: Diagnosis not present

## 2022-02-08 DIAGNOSIS — I959 Hypotension, unspecified: Secondary | ICD-10-CM | POA: Diagnosis not present

## 2022-02-08 DIAGNOSIS — E119 Type 2 diabetes mellitus without complications: Secondary | ICD-10-CM | POA: Diagnosis not present

## 2022-02-11 ENCOUNTER — Ambulatory Visit (INDEPENDENT_AMBULATORY_CARE_PROVIDER_SITE_OTHER): Payer: PPO | Admitting: Urology

## 2022-02-11 VITALS — BP 145/82 | HR 82

## 2022-02-11 DIAGNOSIS — N200 Calculus of kidney: Secondary | ICD-10-CM | POA: Diagnosis not present

## 2022-02-11 LAB — MICROSCOPIC EXAMINATION
RBC, Urine: >30 /hpf — AB (ref 0–2)
Renal Epithel, UA: NONE SEEN /hpf

## 2022-02-11 LAB — URINALYSIS, ROUTINE W REFLEX MICROSCOPIC
Bilirubin, UA: NEGATIVE
Glucose, UA: NEGATIVE
Nitrite, UA: NEGATIVE
Specific Gravity, UA: 1.015 (ref 1.005–1.030)
Urobilinogen, Ur: 0.2 mg/dL (ref 0.2–1.0)
pH, UA: 6 (ref 5.0–7.5)

## 2022-02-11 MED ORDER — HYDROCODONE-ACETAMINOPHEN 5-325 MG PO TABS: 1.0000 | ORAL_TABLET | Freq: Four times a day (QID) | ORAL | 0 refills | Status: DC | PRN

## 2022-02-11 NOTE — Progress Notes (Unsigned)
02/11/2022 10:09 AM   Patricia [name] October 15, 1950 [id number]  Referring provider: Assunta Found, MD [address] [place],  [place] [address]  nephrolithiasis   HPI: Patricia [name] is a 70yo here for followup after bilateral ureteral stent placement. She underwent stent placement 2 weeks ago for acute renal failure and sepsis from a urinary source. Creatinine normalized. She has mild bilateral flank pain with the stents in place. No hematuria. She has urinary urgency and frequency. She is currently on vesicare 10mg  daily   PMH: Past Medical History:  Diagnosis Date   Arthritis    Diabetes mellitus without complication (HCC)    Hypertension    Hypothyroidism    PONV (postoperative nausea and vomiting)     Surgical History: Past Surgical History:  Procedure Laterality Date   ABDOMINAL HYSTERECTOMY     CHOLECYSTECTOMY N/A 03/02/2015   Procedure: LAPAROSCOPIC CHOLECYSTECTOMY;  Surgeon: 05/02/2015, MD;  Location: [facility];  Service: General;  Laterality: N/A;   CYSTOSCOPY W/ URETERAL STENT PLACEMENT Bilateral 02/02/2022   Procedure: CYSTOSCOPY WITH RETROGRADE PYELOGRAM;  Surgeon: 02/04/2022, MD;  Location: [facility];  Service: Urology;  Laterality: Bilateral;   HOLMIUM LASER APPLICATION Right 05/02/2019   Procedure: HOLMIUM LASER APPLICATION;  Surgeon: 13/05/2019, MD;  Location: [facility];  Service: Urology;  Laterality: Right;   IR URETERAL STENT RIGHT NEW ACCESS W/O SEP NEPHROSTOMY CATH  05/02/2019   KNEE ARTHROSCOPY Right    LIVER BIOPSY  03/02/2015   Procedure: LAPAROSCOPIC LIVER BIOPSY;  Surgeon: 05/02/2015, MD;  Location: [facility];  Service: General;;   NEPHROLITHOTOMY Right 05/02/2019   Procedure: NEPHROLITHOTOMY PERCUTANEOUS;  Surgeon: 13/05/2019, MD;  Location: [facility];  Service: Urology;  Laterality: Right;  3 HRS   THYROIDECTOMY     TONSILLECTOMY  1970   TUBAL LIGATION      Home Medications:  Allergies as of 02/11/2022       Reactions    Amlodipine    Swelling   Keflex [cephalexin] Rash        Medication List        Accurate as of February 11, 2022 10:09 AM. If you have any questions, ask your nurse or doctor.          APPLE CIDER VINEGAR PO Take 1 tablet by mouth daily.   cetirizine 10 MG tablet Commonly known as: ZYRTEC Take 10 mg by mouth daily.   CINNAMON PO Take 1,000 mg by mouth daily.   Dilt-XR 180 MG 24 hr capsule Generic drug: diltiazem Take 180 mg by mouth daily.   escitalopram 5 MG tablet Commonly known as: LEXAPRO Take 5 mg by mouth daily.   Fish Oil 1000 MG Caps Take 1 capsule by mouth daily.   lactobacillus acidophilus & bulgar chewable tablet Chew 1 tablet by mouth 3 (three) times daily with meals for 7 days.   levothyroxine 175 MCG tablet Commonly known as: SYNTHROID Take 175 mcg by mouth daily.   meloxicam 15 MG tablet Commonly known as: MOBIC Take 15 mg by mouth as needed for pain.   metFORMIN 500 MG tablet Commonly known as: GLUCOPHAGE Take 500 mg by mouth 2 (two) times daily.   ondansetron 4 MG tablet Commonly known as: ZOFRAN Take 1 tablet (4 mg total) by mouth every 6 (six) hours.   pregabalin 200 MG capsule Commonly known as: LYRICA Take 200 mg by mouth 2 (two) times daily.   simvastatin 20 MG tablet Commonly known as: ZOCOR Take 20 mg  by mouth daily.   solifenacin 10 MG tablet Commonly known as: VESICARE Take 1 tablet (10 mg total) by mouth daily.   Tiadylt ER 360 MG 24 hr capsule Generic drug: diltiazem Take 360 mg by mouth daily.        Allergies:  Allergies  Allergen Reactions   Amlodipine     Swelling   Keflex [Cephalexin] Rash    Family History: No family history on file.  Social History:  reports that she has never smoked. She has never used smokeless tobacco. She reports that she does not drink alcohol and does not use drugs.  ROS: All other review of systems were reviewed and are negative except what is noted above in  HPI  Physical Exam: BP (!) 145/82   Pulse 82   Constitutional:  Alert and oriented, No acute distress. HEENT: Patricia [name] AT, moist mucus membranes.  Trachea midline, no masses. Cardiovascular: No clubbing, cyanosis, or edema. Respiratory: Normal respiratory effort, no increased work of breathing. GI: Abdomen is soft, nontender, nondistended, no abdominal masses GU: No CVA tenderness.  Lymph: No cervical or inguinal lymphadenopathy. Skin: No rashes, bruises or suspicious lesions. Neurologic: Grossly intact, no focal deficits, moving all 4 extremities. Psychiatric: Normal mood and affect.  Laboratory Data: Lab Results  Component Value Date   WBC 7.8 02/05/2022   HGB 10.9 (L) 02/05/2022   HCT 32.2 (L) 02/05/2022   MCV 87.5 02/05/2022   PLT 246 02/05/2022    Lab Results  Component Value Date   CREATININE 1.47 (H) 02/05/2022    No results [name] for: "PSA"  No results [name] for: "TESTOSTERONE"  Lab Results  Component Value Date   HGBA1C 6.9 (H) 04/26/2019    Urinalysis    Component Value Date/Time   COLORURINE YELLOW 02/01/2022 2140   APPEARANCEUR CLOUDY (A) 02/01/2022 2140   APPEARANCEUR Clear 02/02/2021 1005   LABSPEC 1.013 02/01/2022 2140   PHURINE 5.0 02/01/2022 2140   GLUCOSEU NEGATIVE 02/01/2022 2140   HGBUR NEGATIVE 02/01/2022 2140   BILIRUBINUR NEGATIVE 02/01/2022 2140   BILIRUBINUR Negative 02/02/2021 1005   KETONESUR NEGATIVE 02/01/2022 2140   PROTEINUR 100 (A) 02/01/2022 2140   UROBILINOGEN 0.2 08/06/2019 1043   UROBILINOGEN 0.2 08/12/2009 1550   NITRITE NEGATIVE 02/01/2022 2140   LEUKOCYTESUR MODERATE (A) 02/01/2022 2140    Lab Results  Component Value Date   LABMICR See below: 02/02/2021   WBCUA 0-5 02/02/2021   LABEPIT 0-10 02/02/2021   BACTERIA MANY (A) 02/01/2022    Pertinent Imaging:  Results for orders placed during the hospital encounter of 02/01/22  DG Abd 1 View  Narrative CLINICAL DATA:  Nephrolithiasis.  EXAM: ABDOMEN - 1  VIEW  COMPARISON:  Radiograph of February 04, 2020. CT scan February 01, 2022.  FINDINGS: The bowel gas pattern is normal. No definite nephrolithiasis is noted. Phleboliths are noted in the pelvis. Large distal right ureteral calculus is noted as described on prior CT scan.  IMPRESSION: Large distal right ureteral calculus is noted as described on prior CT scan.   Electronically Signed By: Lupita Raider M.D. On: 02/02/2022 09:53  No results [name] for this or any previous visit.  No results [name] for this or any previous visit.  No results [name] for this or any previous visit.  Results for orders placed during the hospital encounter of 02/01/22  US RENAL  Narrative CLINICAL DATA:  RIGHT ureteral calculus with trace RIGHT hydronephrosis on CT, diabetes mellitus, hypertension  EXAM: RENAL / URINARY TRACT ULTRASOUND  COMPLETE  COMPARISON:  CT abdomen and pelvis 02/01/2022  FINDINGS: Right Kidney:  Renal measurements: 13.2 x 6.6 x 6.3 cm = volume: 287 mL. Normal cortical echogenicity. Diffuse cortical thinning. No mass or hydronephrosis. No shadowing calcification.  Left Kidney:  Renal measurements: 12.6 x 7.1 x 6.7 cm = volume: 317 mL. Normal cortical thickness and echogenicity. No mass, hydronephrosis or shadowing calcification.  Bladder:  Distended urinary bladder, volume 781 mL. Ureteral jets were not visualized during the interval of imaging. No gross bladder mass or wall thickening.  Other:  N/A  IMPRESSION: Distended urinary bladder.  No evidence of renal mass or hydronephrosis.   Electronically Signed By: Ulyses Southward M.D. On: 02/02/2022 08:45  No results [name] for this or any previous visit.  No results [name] for this or any previous visit.  Results for orders placed during the hospital encounter of 06/07/19  CT RENAL STONE STUDY  Narrative CLINICAL DATA:  Recent right-sided stone, status post surgery on 06/01/2019.  EXAM: CT ABDOMEN  AND PELVIS WITHOUT CONTRAST  TECHNIQUE: Multidetector CT imaging of the abdomen and pelvis was performed following the standard protocol without IV contrast.  COMPARISON:  03/01/2019  FINDINGS: Lower chest: Unremarkable  Hepatobiliary: No focal abnormality in the liver on this study without intravenous contrast. Gallbladder is surgically absent. No intrahepatic or extrahepatic biliary dilation.  Pancreas: No focal mass lesion. No dilatation of the main duct. No intraparenchymal cyst. No peripancreatic edema.  Spleen: No splenomegaly. No focal mass lesion.  Adrenals/Urinary Tract: No adrenal nodule or mass.  Large stone seen previously in the right renal pelvis is no longer evident. No calcific stone debris in the right intrarenal collecting system or renal pelvis. No hydronephrosis. No right hydroureter although there is a small focus of subtle increased attenuation in the distal right ureter immediately adjacent to tiny phleboliths (see axial image 80/series 2 and coronal image 76/series 5). This is in the same approximate location as the distal ureteral stone seen on the previous study although that stone is no longer evident.  2 cm water density lesion upper pole left kidney is stable and remains compatible with a cyst. No left renal or ureteral stones.  No bladder stones  Stomach/Bowel: Small hiatal hernia. Stomach otherwise unremarkable. Duodenum is normally positioned as is the ligament of Treitz. No small bowel wall thickening. No small bowel dilatation. The terminal ileum is normal. The appendix is normal. No gross colonic mass. No colonic wall thickening. Diverticular changes are noted in the left colon without evidence of diverticulitis.  Vascular/Lymphatic: There is abdominal aortic atherosclerosis without aneurysm. There is no gastrohepatic or hepatoduodenal ligament lymphadenopathy. No retroperitoneal or mesenteric lymphadenopathy. No pelvic sidewall  lymphadenopathy.  Reproductive: The uterus is surgically absent. There is no adnexal mass.  Other: No intraperitoneal free fluid.  Musculoskeletal: No worrisome lytic or sclerotic osseous abnormality. Stable superior endplate compression deformity at L3.  IMPRESSION: 1. Large stone in the right renal pelvis seen previously is no longer evident. The distal right ureteral stone is no longer present. 2. Short segment of increased attenuation in the distal right ureter at the location of the stone seen previously. This could represent fine stone debris, clot, or urothelial lesion. Close follow-up recommended and follow-up hematuria protocol CT may prove helpful to further evaluate. There is no associated right hydroureteronephrosis on today's exam. 3. Left colonic diverticulosis without diverticulitis. 4.  Aortic Atherosclerois (ICD10-170.0)   Electronically Signed By: Kennith Center M.D. On: 06/07/2019 09:30   Assessment &  Plan:    1. Renal calculi -We discussed the management of kidney stones. These options include observation, ureteroscopy, shockwave lithotripsy (ESWL) and percutaneous nephrolithotomy (PCNL). We discussed which options are relevant to the patient's stone(s). We discussed the natural history of kidney stones as well as the complications of untreated stones and the impact on quality of life without treatment as well as with each of the above listed treatments. We also discussed the efficacy of each treatment in its ability to clear the stone burden. With any of these management options I discussed the signs and symptoms of infection and the need for emergent treatment should these be experienced. For each option we discussed the ability of each procedure to clear the patient of their stone burden.   For observation I described the risks which include but are not limited to silent renal damage, life-threatening infection, need for emergent surgery, failure to pass stone  and pain.   For ureteroscopy I described the risks which include bleeding, infection, damage to contiguous structures, positioning injury, ureteral stricture, ureteral avulsion, ureteral injury, need for prolonged ureteral stent, inability to perform ureteroscopy, need for an interval procedure, inability to clear stone burden, stent discomfort/pain, heart attack, stroke, pulmonary embolus and the inherent risks with general anesthesia.   For shockwave lithotripsy I described the risks which include arrhythmia, kidney contusion, kidney hemorrhage, need for transfusion, pain, inability to adequately break up stone, inability to pass stone fragments, Steinstrasse, infection associated with obstructing stones, need for alternate surgical procedure, need for repeat shockwave lithotripsy, MI, CVA, PE and the inherent risks with anesthesia/conscious sedation.   For PCNL I described the risks including positioning injury, pneumothorax, hydrothorax, need for chest tube, inability to clear stone burden, renal laceration, arterial venous fistula or malformation, need for embolization of kidney, loss of kidney or renal function, need for repeat procedure, need for prolonged nephrostomy tube, ureteral avulsion, MI, CVA, PE and the inherent risks of general anesthesia.   - The patient would like to proceed with bilateral ureteroscopic stone extraction - Urinalysis, Routine w reflex microscopic   No follow-ups on file.  Wilkie Aye, MD  [facility] [place]

## 2022-02-12 ENCOUNTER — Emergency Department (HOSPITAL_COMMUNITY): Payer: PPO

## 2022-02-12 ENCOUNTER — Other Ambulatory Visit: Payer: Self-pay

## 2022-02-12 ENCOUNTER — Encounter (HOSPITAL_COMMUNITY): Payer: Self-pay | Admitting: Emergency Medicine

## 2022-02-12 ENCOUNTER — Emergency Department (HOSPITAL_COMMUNITY)
Admission: EM | Admit: 2022-02-12 | Discharge: 2022-02-12 | Disposition: A | Discharge status: Home / Self Care | Payer: PPO | Attending: Emergency Medicine | Admitting: Emergency Medicine

## 2022-02-12 DIAGNOSIS — E119 Type 2 diabetes mellitus without complications: Secondary | ICD-10-CM | POA: Diagnosis not present

## 2022-02-12 DIAGNOSIS — R109 Unspecified abdominal pain: Secondary | ICD-10-CM | POA: Insufficient documentation

## 2022-02-12 DIAGNOSIS — D72829 Elevated white blood cell count, unspecified: Secondary | ICD-10-CM | POA: Diagnosis not present

## 2022-02-12 DIAGNOSIS — R339 Retention of urine, unspecified: Secondary | ICD-10-CM | POA: Diagnosis not present

## 2022-02-12 DIAGNOSIS — N281 Cyst of kidney, acquired: Secondary | ICD-10-CM | POA: Diagnosis not present

## 2022-02-12 DIAGNOSIS — R7989 Other specified abnormal findings of blood chemistry: Secondary | ICD-10-CM | POA: Insufficient documentation

## 2022-02-12 DIAGNOSIS — N132 Hydronephrosis with renal and ureteral calculous obstruction: Secondary | ICD-10-CM | POA: Diagnosis not present

## 2022-02-12 DIAGNOSIS — K5730 Diverticulosis of large intestine without perforation or abscess without bleeding: Secondary | ICD-10-CM | POA: Diagnosis not present

## 2022-02-12 DIAGNOSIS — I10 Essential (primary) hypertension: Secondary | ICD-10-CM | POA: Diagnosis not present

## 2022-02-12 DIAGNOSIS — D649 Anemia, unspecified: Secondary | ICD-10-CM | POA: Diagnosis not present

## 2022-02-12 DIAGNOSIS — Z7984 Long term (current) use of oral hypoglycemic drugs: Secondary | ICD-10-CM | POA: Diagnosis not present

## 2022-02-12 LAB — URINALYSIS, ROUTINE W REFLEX MICROSCOPIC
Bacteria, UA: NONE SEEN
Bilirubin Urine: NEGATIVE
Glucose, UA: NEGATIVE mg/dL
Ketones, ur: NEGATIVE mg/dL
Nitrite: NEGATIVE
Protein, ur: 100 mg/dL — AB
Specific Gravity, Urine: 1.005 (ref 1.005–1.030)
pH: 6 (ref 5.0–8.0)

## 2022-02-12 LAB — BASIC METABOLIC PANEL
Anion gap: 9 (ref 5–15)
BUN: 22 mg/dL (ref 8–23)
CO2: 23 mmol/L (ref 22–32)
Calcium: 9.1 mg/dL (ref 8.9–10.3)
Chloride: 104 mmol/L (ref 98–111)
Creatinine, Ser: 1.96 mg/dL — ABNORMAL HIGH (ref 0.44–1.00)
GFR, Estimated: 27 mL/min — ABNORMAL LOW (ref >60)
Glucose, Bld: 98 mg/dL (ref 70–99)
Potassium: 4.4 mmol/L (ref 3.5–5.1)
Sodium: 136 mmol/L (ref 135–145)

## 2022-02-12 LAB — CBC WITH DIFFERENTIAL/PLATELET
Abs Immature Granulocytes: 0.08 10*3/uL — ABNORMAL HIGH (ref 0.00–0.07)
Basophils Absolute: 0.1 10*3/uL (ref 0.0–0.1)
Basophils Relative: 1 %
Eosinophils Absolute: 0.6 10*3/uL — ABNORMAL HIGH (ref 0.0–0.5)
Eosinophils Relative: 5 %
HCT: 34.4 % — ABNORMAL LOW (ref 36.0–46.0)
Hemoglobin: 11.4 g/dL — ABNORMAL LOW (ref 12.0–15.0)
Immature Granulocytes: 1 %
Lymphocytes Relative: 30 %
Lymphs Abs: 4 10*3/uL (ref 0.7–4.0)
MCH: 29.7 pg (ref 26.0–34.0)
MCHC: 33.1 g/dL (ref 30.0–36.0)
MCV: 89.6 fL (ref 80.0–100.0)
Monocytes Absolute: 1.2 10*3/uL — ABNORMAL HIGH (ref 0.1–1.0)
Monocytes Relative: 9 %
Neutro Abs: 7.2 10*3/uL (ref 1.7–7.7)
Neutrophils Relative %: 54 %
Platelets: 361 10*3/uL (ref 150–400)
RBC: 3.84 MIL/uL — ABNORMAL LOW (ref 3.87–5.11)
RDW: 14.9 % (ref 11.5–15.5)
WBC: 13.3 10*3/uL — ABNORMAL HIGH (ref 4.0–10.5)
nRBC: 0 % (ref 0.0–0.2)

## 2022-02-12 MED ORDER — SULFAMETHOXAZOLE-TRIMETHOPRIM 800-160 MG PO TABS
1.0000 | ORAL_TABLET | Freq: Once | ORAL | Status: AC
  Administered 2022-02-12: 1 via ORAL
  Filled 2022-02-12: qty 1

## 2022-02-12 MED ORDER — SULFAMETHOXAZOLE-TRIMETHOPRIM 800-160 MG PO TABS: 1.0000 | ORAL_TABLET | Freq: Two times a day (BID) | ORAL | 0 refills | Status: AC

## 2022-02-12 NOTE — ED Triage Notes (Signed)
Pt tot he ED with complaints of urinary retention for the past 15 hours.  Pt has stents in her kidneys and has a surgery scheduled for the near future.

## 2022-02-12 NOTE — Discharge Instructions (Addendum)
You are seen in the emergency department for urinary retention.  No signs of emergent obstruction requiring immediate surgery on the scan.  The Foley catheter should remain in until evaluated by urology.  Take Bactrim twice daily for 7 days to cover in case of UTI.  If you develop fevers, vomiting, severe pain or inability to urinate despite the Foley you need to return back to the emergency department for reevaluation.  Otherwise please call Dr. Ronne Binning and schedule follow-up for Monday for reevaluation.

## 2022-02-12 NOTE — ED Provider Notes (Signed)
[facility] EMERGENCY DEPARTMENT Provider Note   CSN: [id number] Arrival date & time: 02/12/22  1626     History  Chief Complaint  Patient presents with   Urinary Retention    Patricia [name] is a 71 y.o. female.  HPI   Patient with medical history of diabetes, hypertension, renal calculi presents today due to urinary retention.  Started about 14 hours ago. She is being followed by urology due to renal stones, scheduled for cystoscopy in the next week.  Denies any fevers or vomiting, she has nauseated.  She has right flank pain which has been going on for the last 2 weeks.  No numbness, saddle anesthesia, back injury.   Home Medications Prior to Admission medications   Medication Sig Start Date End Date Taking? Authorizing Provider  sulfamethoxazole-trimethoprim (BACTRIM DS) 800-160 MG tablet Take 1 tablet by mouth 2 (two) times daily for 7 days. 02/12/22 02/19/22 Yes Theron Arista, PA-C  APPLE CIDER VINEGAR PO Take 1 tablet by mouth daily.    [provider]  cetirizine (ZYRTEC) 10 MG tablet Take 10 mg by mouth daily.    [provider]  CINNAMON PO Take 1,000 mg by mouth daily.    [provider]  DILT-XR 180 MG 24 hr capsule Take 180 mg by mouth daily. Patient not taking: Reported on 02/11/2022 08/18/21   [provider]  escitalopram (LEXAPRO) 5 MG tablet Take 5 mg by mouth daily. 07/17/19   [provider]  HYDROcodone-acetaminophen (NORCO) 5-325 MG tablet Take 1 tablet by mouth every 6 (six) hours as needed for moderate pain. 02/11/22   McKenzie, Mardene Celeste, MD  lactobacillus acidophilus & bulgar (LACTINEX) chewable tablet Chew 1 tablet by mouth 3 (three) times daily with meals for 7 days. 02/05/22 02/12/22  Kendell Bane, MD  levothyroxine (SYNTHROID) 175 MCG tablet Take 175 mcg by mouth daily. 11/10/21   [provider]  meloxicam (MOBIC) 15 MG tablet Take 15 mg by mouth as needed for pain.    [provider]   metFORMIN (GLUCOPHAGE) 500 MG tablet Take 500 mg by mouth 2 (two) times daily. 11/08/21   [provider]  Omega-3 Fatty Acids (FISH OIL) 1000 MG CAPS Take 1 capsule by mouth daily.    [provider]  ondansetron (ZOFRAN) 4 MG tablet Take 1 tablet (4 mg total) by mouth every 6 (six) hours. Patient not taking: Reported on 02/11/2022 01/30/22   Lonell Grandchild, MD  pregabalin (LYRICA) 200 MG capsule Take 200 mg by mouth 2 (two) times daily. 01/18/22   [provider]  simvastatin (ZOCOR) 20 MG tablet Take 20 mg by mouth daily.    [provider]  solifenacin (VESICARE) 10 MG tablet Take 1 tablet (10 mg total) by mouth daily. 02/01/22   Marcine Matar, MD  TIADYLT ER 360 MG 24 hr capsule Take 360 mg by mouth daily. 11/28/21   [provider]      Allergies    Amlodipine and Keflex [cephalexin]    Review of Systems   Review of Systems  Physical Exam Updated Vital Signs BP 120/64   Pulse 63   Temp 98.4 F (36.9 C) (Oral)   Resp 18   Ht 5\' 4"  (1.626 m)   Wt 82.8 kg   SpO2 98%   BMI 31.33 kg/m  Physical Exam Vitals and nursing note reviewed. Exam conducted with a chaperone present.  Constitutional:      Appearance: Normal appearance.  HENT:  Head: Normocephalic and atraumatic.     Mouth/Throat:     Mouth: Mucous membranes are dry.  Eyes:     General: No scleral icterus.       Right eye: No discharge.        Left eye: No discharge.     Extraocular Movements: Extraocular movements intact.     Pupils: Pupils are equal, round, and reactive to light.  Cardiovascular:     Rate and Rhythm: Normal rate and regular rhythm.     Pulses: Normal pulses.     Heart sounds: Normal heart sounds. No murmur heard.    No friction rub. No gallop.  Pulmonary:     Effort: Pulmonary effort is normal. No respiratory distress.     Breath sounds: Normal breath sounds.  Abdominal:     General: Abdomen is flat. Bowel sounds are normal. There is no  distension.     Palpations: Abdomen is soft.     Tenderness: There is no abdominal tenderness. There is right CVA tenderness.     Comments: Mild right CVA tenderness, no abdominal tenderness or guarding  Skin:    General: Skin is warm and dry.     Coloration: Skin is not jaundiced.  Neurological:     Mental Status: She is alert. Mental status is at baseline.     Coordination: Coordination normal.     ED Results / Procedures / Treatments   Labs (all labs ordered are listed, but only abnormal results are displayed) Labs Reviewed  URINALYSIS, ROUTINE W REFLEX MICROSCOPIC - Abnormal; Notable for the following components:      Result Value   APPearance HAZY (*)    Hgb urine dipstick MODERATE (*)    Protein, ur 100 (*)    Leukocytes,Ua SMALL (*)    All other components within normal limits  BASIC METABOLIC PANEL - Abnormal; Notable for the following components:   Creatinine, Ser 1.96 (*)    GFR, Estimated 27 (*)    All other components within normal limits  CBC WITH DIFFERENTIAL/PLATELET - Abnormal; Notable for the following components:   WBC 13.3 (*)    RBC 3.84 (*)    Hemoglobin 11.4 (*)    HCT 34.4 (*)    Monocytes Absolute 1.2 (*)    Eosinophils Absolute 0.6 (*)    Abs Immature Granulocytes 0.08 (*)    All other components within normal limits  URINE CULTURE    EKG None  Radiology CT Renal Stone Study  Result Date: 02/12/2022 CLINICAL DATA:  71 year old female with urinary retention and abdominal pain. Patient has bilateral urinary stents. EXAM: CT ABDOMEN AND PELVIS WITHOUT CONTRAST TECHNIQUE: Multidetector CT imaging of the abdomen and pelvis was performed following the standard protocol without IV contrast. RADIATION DOSE REDUCTION: This exam was performed according to the departmental dose-optimization program which includes automated exposure control, adjustment of the mA and/or kV according to patient size and/or use of iterative reconstruction technique. COMPARISON:   02/01/2022 CT and prior studies FINDINGS: Please note that parenchymal and vascular abnormalities may be missed as intravenous contrast was not administered. Lower chest: No acute abnormality. Hepatobiliary: No hepatic abnormalities are identified. The patient is status post cholecystectomy. There is no evidence of intrahepatic or extrahepatic biliary dilatation. Pancreas: Unremarkable Spleen: Unremarkable Adrenals/Urinary Tract: Bilateral ureteral stents are now identified. The RIGHT ureteral stent tips lie in the RIGHT renal pelvis and bladder. The LEFT ureteral stent tips lie in the renal pelvis/upper collecting system and within the bladder. There  is mild RIGHT hydronephrosis, slightly increased since 02/01/2022. An equivocal faint 4 mm calcification/calculus along the RIGHT ureteral stent in the distal ureter is noted (image 80: Series 2). A LEFT renal cyst is again noted and no imaging follow-up is recommended. The adrenal glands are unremarkable. Stomach/Bowel: Stomach is within normal limits. Appendix appears normal. No evidence of bowel wall thickening, distention, or inflammatory changes. Colonic diverticulosis identified without evidence of acute diverticulitis. Vascular/Lymphatic: Aortic atherosclerosis. No enlarged abdominal or pelvic lymph nodes. Reproductive: Status post hysterectomy. No adnexal masses. Other: No ascites, focal collection or pneumoperitoneum. Musculoskeletal: No acute or suspicious bony abnormalities are noted. Mild degenerative changes in the lumbar spine again noted. IMPRESSION: 1. Bilateral ureteral stents with mild RIGHT hydronephrosis, slightly increased since 02/01/2022. Equivocal faint 4 mm calcification/calculus along the RIGHT ureteral stent in the distal RIGHT ureter. 2. Aortic Atherosclerosis (ICD10-I70.0). Electronically Signed   By: Harmon Pier M.D.   On: 02/12/2022 19:05    Procedures Procedures    Medications Ordered in ED Medications   sulfamethoxazole-trimethoprim (BACTRIM DS) 800-160 MG per tablet 1 tablet (1 tablet Oral Given 02/12/22 2016)    ED Course/ Medical Decision Making/ A&P Clinical Course as of 02/12/22 2333  Sat Feb 12, 2022  1920 Spoke with Dr. Annabell Howells with urology.  Discussed laboratory work-up, presentation, CT renal study.  He advises Foley catheter and outpatient office follow-up.  States it is reasonable to cover with antibiotic given white count otherwise likely reactionary from the retention.  Advises urine culture send out given UA would not be entirely reliable.  Patient is not septic they can follow-up, if septic and family not comfortable sending home it would not be unreasonable to admit for observation.  I will engaged in shared decision-making with the patient. [HS]  1936 Engaged in shared decision-making with the patient, son and daughter.  They are comfortable trying the Foley catheter and close outpatient follow-up with urology.  Will cover with Bactrim given patient has an allergy to Keflex. [HS]    Clinical Course User Index [HS] Theron Arista, PA-C                           Medical Decision Making Amount and/or Complexity of Data Reviewed Labs: ordered. Radiology: ordered.  Risk Prescription drug management.   Patient presents due to urinary retention.  Differential includes not limited to obstructive uropathy, AKI, sepsis, septic stone.  Considered cauda equina but no focal deficits, back pain, risk factors for it.  Patient's son is independent historian.  Additionally reviewed external records -Admitted 8/15 -8/19 for UTI and obstructing stone.  Discharge 8/18. -Bilateral retrograde pyelography with bilateral stent placement 02/02/2022 by Dr. Ronne Binning with urology.  On exam patient does not appear septic.  Stable blood pressure, not febrile and not tachycardic or hypoxic.  Mild CVA tenderness to the right, abdomen is soft and nondistended.  I ordered and reviewed laboratory  work-up. -CBC with leukocytosis of 13.3. Mild anemia with hemoglobin 11.4. -BMP without gross electrolyte derangement.  Creatinine is elevated at 1.96.  BUN  Date Value Ref Range Status  02/12/2022 22 8 - 23 mg/dL Final  39/76/7341 28 (H) 8 - 23 mg/dL Final  93/79/0240 32 (H) 8 - 23 mg/dL Final  97/35/3299 42 (H) 8 - 23 mg/dL Final   Creatinine, Ser  Date Value Ref Range Status  02/12/2022 1.96 (H) 0.44 - 1.00 mg/dL Final  24/26/8341 9.62 (H) 0.44 - 1.00 mg/dL Final  22/97/9892  1.66 (H) 0.44 - 1.00 mg/dL Final  54/65/6812 7.51 (H) 0.44 - 1.00 mg/dL Final    Comment:    DELTA CHECK NOTED   Bladder scan 155 mL.  UA collected, urine culture sent out.  CT renal study with patent bilateral stents and mild right hydronephrosis.  I consulted as documented in ED course.    Patient will follow-up with allergy Monday, Foley catheter in place.  We will prophylactically treat with Bactrim.  Strict return precautions discussed with patient, discharge stable condition.        Final Clinical Impression(s) / ED Diagnoses Final diagnoses:  Urinary retention    Rx / DC Orders ED Discharge Orders          Ordered    sulfamethoxazole-trimethoprim (BACTRIM DS) 800-160 MG tablet  2 times daily        02/12/22 2006              Theron Arista, [place] 02/12/22 2333    Terald Sleeper, MD 02/13/22 1104

## 2022-02-14 ENCOUNTER — Telehealth: Payer: Self-pay

## 2022-02-14 LAB — URINE CULTURE: Culture: NO GROWTH

## 2022-02-14 NOTE — Telephone Encounter (Signed)
Daughter, Aggie Cosier - [phone] left a voice message 02-14-2022.  Needing a call back regarding upcoming surgery.  Has been hospitalized and wants to know if she will still need surgery.  Please call Crystal back.  Thanks, Rosey Bath

## 2022-02-14 NOTE — Telephone Encounter (Signed)
Returned daughters call. Patient scheduled to see Dr. Ronne Binning Wednesday and will discuss next steps in office concerning catheter. Surgery will continue as scheduled for 03/03/2022  Voiced understanding.

## 2022-02-16 ENCOUNTER — Ambulatory Visit: Payer: PPO | Admitting: Urology

## 2022-02-16 ENCOUNTER — Encounter: Payer: Self-pay | Admitting: Urology

## 2022-02-16 VITALS — BP 123/67 | HR 89

## 2022-02-16 DIAGNOSIS — N200 Calculus of kidney: Secondary | ICD-10-CM | POA: Diagnosis not present

## 2022-02-16 DIAGNOSIS — R339 Retention of urine, unspecified: Secondary | ICD-10-CM | POA: Diagnosis not present

## 2022-02-16 NOTE — Progress Notes (Signed)
02/16/2022 9:19 AM   Patricia [name] [birth year] IP:[id number]  Referring provider: Sharilyn Sites, MD [address] [place],  [place] [address]  Followup urinary retention   HPI: Patricia [name] 70yo here for for followup for nephrolithiasis and urinary retention. She developed urinary retention Saturday and had a foley placed. She has been on vesicare since the 8/15. Urine is clear. No pain with the catheter in place. No other complaints today.    PMH: Past Medical History:  Diagnosis Date   Arthritis    Diabetes mellitus without complication (HCC)    Hypertension    Hypothyroidism    PONV (postoperative nausea and vomiting)     Surgical History: Past Surgical History:  Procedure Laterality Date   ABDOMINAL HYSTERECTOMY     CHOLECYSTECTOMY N/A 03/02/2015   Procedure: LAPAROSCOPIC CHOLECYSTECTOMY;  Surgeon: Aviva Signs, MD;  Location: [facility];  Service: General;  Laterality: N/A;   CYSTOSCOPY W/ URETERAL STENT PLACEMENT Bilateral 02/02/2022   Procedure: CYSTOSCOPY WITH RETROGRADE PYELOGRAM;  Surgeon: Cleon Gustin, MD;  Location: [facility];  Service: Urology;  Laterality: Bilateral;   HOLMIUM LASER APPLICATION Right [id number]   Procedure: HOLMIUM LASER APPLICATION;  Surgeon: Franchot Gallo, MD;  Location: [facility];  Service: Urology;  Laterality: Right;   IR URETERAL STENT RIGHT NEW ACCESS W/O SEP NEPHROSTOMY CATH  05/02/2019   KNEE ARTHROSCOPY Right    LIVER BIOPSY  03/02/2015   Procedure: LAPAROSCOPIC LIVER BIOPSY;  Surgeon: Aviva Signs, MD;  Location: [facility];  Service: General;;   NEPHROLITHOTOMY Right 05/02/2019   Procedure: NEPHROLITHOTOMY PERCUTANEOUS;  Surgeon: Franchot Gallo, MD;  Location: [facility];  Service: Urology;  Laterality: Right;  3 HRS   THYROIDECTOMY     TONSILLECTOMY  1970   TUBAL LIGATION      Home Medications:  Allergies as of 02/16/2022       Reactions   Amlodipine    Swelling   Keflex [cephalexin] Rash        Medication List         Accurate as of February 16, 2022  9:19 AM. If you have any questions, ask your nurse or doctor.          APPLE CIDER VINEGAR PO Take 1 tablet by mouth daily.   cetirizine 10 MG tablet Commonly known as: ZYRTEC Take 10 mg by mouth daily.   CINNAMON PO Take 1,000 mg by mouth daily.   Dilt-XR 180 MG 24 hr capsule Generic drug: diltiazem Take 180 mg by mouth daily.   escitalopram 5 MG tablet Commonly known as: LEXAPRO Take 5 mg by mouth daily.   Fish Oil 1000 MG Caps Take 1 capsule by mouth daily.   HYDROcodone-acetaminophen 5-325 MG tablet Commonly known as: Norco Take 1 tablet by mouth every 6 (six) hours as needed for moderate pain.   levothyroxine 175 MCG tablet Commonly known as: SYNTHROID Take 175 mcg by mouth daily.   meloxicam 15 MG tablet Commonly known as: MOBIC Take 15 mg by mouth as needed for pain.   metFORMIN 500 MG tablet Commonly known as: GLUCOPHAGE Take 500 mg by mouth 2 (two) times daily.   ondansetron 4 MG tablet Commonly known as: ZOFRAN Take 1 tablet (4 mg total) by mouth every 6 (six) hours.   pregabalin 200 MG capsule Commonly known as: LYRICA Take 200 mg by mouth 2 (two) times daily.   simvastatin 20 MG tablet Commonly known as: ZOCOR Take 20 mg by mouth daily.   solifenacin 10 MG  tablet Commonly known as: VESICARE Take 1 tablet (10 mg total) by mouth daily.   sulfamethoxazole-trimethoprim 800-160 MG tablet Commonly known as: BACTRIM DS Take 1 tablet by mouth 2 (two) times daily for 7 days.   Tiadylt ER 360 MG 24 hr capsule Generic drug: diltiazem Take 360 mg by mouth daily.        Allergies:  Allergies  Allergen Reactions   Amlodipine     Swelling   Keflex [Cephalexin] Rash    Family History: No family history on file.  Social History:  reports that she has never smoked. She has never used smokeless tobacco. She reports that she does not drink alcohol and does not use drugs.  ROS: All other review of  systems were reviewed and are negative except what is noted above in HPI  Physical Exam: BP 123/67   Pulse 89   Constitutional:  Alert and oriented, No acute distress. HEENT: [place] AT, moist mucus membranes.  Trachea midline, no masses. Cardiovascular: No clubbing, cyanosis, or edema. Respiratory: Normal respiratory effort, no increased work of breathing. GI: Abdomen is soft, nontender, nondistended, no abdominal masses GU: No CVA tenderness.  Lymph: No cervical or inguinal lymphadenopathy. Skin: No rashes, bruises or suspicious lesions. Neurologic: Grossly intact, no focal deficits, moving all 4 extremities. Psychiatric: Normal mood and affect.  Laboratory Data: Lab Results  Component Value Date   WBC 13.3 (H) 02/12/2022   HGB 11.4 (L) 02/12/2022   HCT 34.4 (L) 02/12/2022   MCV 89.6 02/12/2022   PLT 361 02/12/2022    Lab Results  Component Value Date   CREATININE 1.96 (H) 02/12/2022    No results found for: "PSA"  No results found for: "TESTOSTERONE"  Lab Results  Component Value Date   HGBA1C 6.9 (H) 04/26/2019    Urinalysis    Component Value Date/Time   COLORURINE YELLOW 02/12/2022 1954   APPEARANCEUR HAZY (A) 02/12/2022 1954   APPEARANCEUR Clear 02/11/2022 1018   LABSPEC 1.005 02/12/2022 1954   PHURINE 6.0 02/12/2022 1954   GLUCOSEU NEGATIVE 02/12/2022 1954   HGBUR MODERATE (A) 02/12/2022 1954   BILIRUBINUR NEGATIVE 02/12/2022 1954   BILIRUBINUR Negative 02/11/2022 1018   KETONESUR NEGATIVE 02/12/2022 1954   PROTEINUR 100 (A) 02/12/2022 1954   UROBILINOGEN 0.2 08/06/2019 1043   UROBILINOGEN 0.2 08/12/2009 1550   NITRITE NEGATIVE 02/12/2022 1954   LEUKOCYTESUR SMALL (A) 02/12/2022 1954    Lab Results  Component Value Date   LABMICR See below: 02/11/2022   WBCUA 6-10 (A) 02/11/2022   LABEPIT 0-10 02/11/2022   MUCUS Present 02/11/2022   BACTERIA NONE SEEN 02/12/2022    Pertinent Imaging: CT 02/12/2022: Images reviewed and discussed with the patient   Results for orders placed during the hospital encounter of 02/01/22  DG Abd 1 View  Narrative CLINICAL DATA:  Nephrolithiasis.  EXAM: ABDOMEN - 1 VIEW  COMPARISON:  Radiograph of February 04, 2020. CT scan February 01, 2022.  FINDINGS: The bowel gas pattern is normal. No definite nephrolithiasis is noted. Phleboliths are noted in the pelvis. Large distal right ureteral calculus is noted as described on prior CT scan.  IMPRESSION: Large distal right ureteral calculus is noted as described on prior CT scan.   Electronically Signed By: Lupita Raider M.D. On: 02/02/2022 09:53  No results found for this or any previous visit.  No results found for this or any previous visit.  No results found for this or any previous visit.  Results for orders placed during the  hospital encounter of 02/01/22  US RENAL  Narrative CLINICAL DATA:  RIGHT ureteral calculus with trace RIGHT hydronephrosis on CT, diabetes mellitus, hypertension  EXAM: RENAL / URINARY TRACT ULTRASOUND COMPLETE  COMPARISON:  CT abdomen and pelvis 02/01/2022  FINDINGS: Right Kidney:  Renal measurements: 13.2 x 6.6 x 6.3 cm = volume: 287 mL. Normal cortical echogenicity. Diffuse cortical thinning. No mass or hydronephrosis. No shadowing calcification.  Left Kidney:  Renal measurements: 12.6 x 7.1 x 6.7 cm = volume: 317 mL. Normal cortical thickness and echogenicity. No mass, hydronephrosis or shadowing calcification.  Bladder:  Distended urinary bladder, volume 781 mL. Ureteral jets were not visualized during the interval of imaging. No gross bladder mass or wall thickening.  Other:  N/A  IMPRESSION: Distended urinary bladder.  No evidence of renal mass or hydronephrosis.   Electronically Signed By: Ulyses Southward M.D. On: 02/02/2022 08:45  No results found for this or any previous visit.  No results found for this or any previous visit.  Results for orders placed during the hospital  encounter of 02/12/22  CT Renal Stone Study  Narrative CLINICAL DATA:  71 year old female with urinary retention and abdominal pain. Patient has bilateral urinary stents.  EXAM: CT ABDOMEN AND PELVIS WITHOUT CONTRAST  TECHNIQUE: Multidetector CT imaging of the abdomen and pelvis was performed following the standard protocol without IV contrast.  RADIATION DOSE REDUCTION: This exam was performed according to the departmental dose-optimization program which includes automated exposure control, adjustment of the mA and/or kV according to patient size and/or use of iterative reconstruction technique.  COMPARISON:  02/01/2022 CT and prior studies  FINDINGS: Please note that parenchymal and vascular abnormalities may be missed as intravenous contrast was not administered.  Lower chest: No acute abnormality.  Hepatobiliary: No hepatic abnormalities are identified. The patient is status post cholecystectomy. There is no evidence of intrahepatic or extrahepatic biliary dilatation.  Pancreas: Unremarkable  Spleen: Unremarkable  Adrenals/Urinary Tract: Bilateral ureteral stents are now identified. The RIGHT ureteral stent tips lie in the RIGHT renal pelvis and bladder. The LEFT ureteral stent tips lie in the renal pelvis/upper collecting system and within the bladder. There is mild RIGHT hydronephrosis, slightly increased since 02/01/2022.  An equivocal faint 4 mm calcification/calculus along the RIGHT ureteral stent in the distal ureter is noted (image 80: Series 2).  A LEFT renal cyst is again noted and no imaging follow-up is recommended.  The adrenal glands are unremarkable.  Stomach/Bowel: Stomach is within normal limits. Appendix appears normal. No evidence of bowel wall thickening, distention, or inflammatory changes. Colonic diverticulosis identified without evidence of acute diverticulitis.  Vascular/Lymphatic: Aortic atherosclerosis. No enlarged abdominal  or pelvic lymph nodes.  Reproductive: Status post hysterectomy. No adnexal masses.  Other: No ascites, focal collection or pneumoperitoneum.  Musculoskeletal: No acute or suspicious bony abnormalities are noted. Mild degenerative changes in the lumbar spine again noted.  IMPRESSION: 1. Bilateral ureteral stents with mild RIGHT hydronephrosis, slightly increased since 02/01/2022. Equivocal faint 4 mm calcification/calculus along the RIGHT ureteral stent in the distal RIGHT ureter. 2. Aortic Atherosclerosis (ICD10-I70.0).   Electronically Signed By: Harmon Pier M.D. On: 02/12/2022 19:05   Assessment & Plan:    1. Renal calculi -Patient scheduled for bilateral ureteroscopic stone extraction  2. Urinary retention -Patient to stop vesicare. She will followup Tuesday for a voiding trial   No follow-ups on file.  Wilkie Aye, MD  [facility] [place]

## 2022-02-16 NOTE — Patient Instructions (Signed)
Acute Urinary Retention, Female  Acute urinary retention is when a person cannot pee (urinate) at all, or can only pee a little. This can come on all of a sudden. If it is not treated, it can lead to kidney problems or other serious problems. What are the causes? A problem with the tube that drains the bladder (urethra). Problems with the nerves in the bladder. The organs in the area between your hip bones (pelvis) slipping out of place (prolapse). Tumors. The birth of a baby through the vagina. An infection. Having trouble pooping (constipation). Certain medicines. What increases the risk? Women over age 50 are more at risk. Other conditions also can increase risk. These include: Diseases, such as multiple sclerosis. Injury to the spinal cord. Diabetes. A condition that affects the way the brain works, such as dementia. Holding back urine due to trauma or because you do not want to use the bathroom. History of not being able to pee or peeing too little. Having had surgery in the area between your hip bones. What are the signs or symptoms? Trouble peeing. Pain in the lower belly. How is this treated? Treatment for this condition may include: Medicines. Placing a thin, germ-free tube (catheter) into the bladder to drain pee out of the body. Therapy to treat mental health conditions. Treatment for conditions that may cause this. If needed, you may be treated in the hospital for kidney problems or to manage other problems. Follow these instructions at home: Medicines Take over-the-counter and prescription medicines only as told by your doctor. Ask your doctor what medicines you should stay away from. If you were given an antibiotic medicine, take it as told by your doctor. Do not stop taking it even if you start to feel better. General instructions Do not smoke or use any products that contain nicotine or tobacco. If you need help quitting, ask your doctor. Drink enough fluid to  keep your pee pale yellow. If you were sent home with a tube that drains the bladder, take care of it as told by your doctor. Watch for changes in your symptoms. Tell your doctor about them. If told, keep track of changes in your blood pressure at home. Tell your doctor about them. Keep all follow-up visits. Contact a doctor if: You have spasms in your bladder that you cannot stop. You leak pee when you have spasms. Get help right away if: You have chills or a fever. You have blood in your pee. You have a tube that drains pee from the bladder and these things happen: The tube stops draining pee. The tube falls out. Summary Acute urinary retention is when you cannot pee at all or you pee too little. If this is not treated, it can cause kidney problems or other serious problems. If you were sent home with a tube (catheter) that drains pee from the bladder, take care of it as told by your doctor. Watch for changes in your symptoms. Tell your doctor about them. This information is not intended to replace advice given to you by your health care provider. Make sure you discuss any questions you have with your health care provider. Document Revised: 02/26/2020 Document Reviewed: 02/26/2020 Elsevier Patient Education  2023 [employer] Inc[employer]  

## 2022-02-16 NOTE — H&P (View-Only) (Signed)
 02/16/2022 9:19 AM   Patricia [name] 06/23/1950 [id number]  Referring provider: Golding, John, MD [address] [place],  [place] [address]  Followup urinary retention   HPI: Patricia [name] 70yo here for for followup for nephrolithiasis and urinary retention. She developed urinary retention Saturday and had a foley placed. She has been on vesicare since the 8/15. Urine is clear. No pain with the catheter in place. No other complaints today.    PMH: Past Medical History:  Diagnosis Date   Arthritis    Diabetes mellitus without complication (HCC)    Hypertension    Hypothyroidism    PONV (postoperative nausea and vomiting)     Surgical History: Past Surgical History:  Procedure Laterality Date   ABDOMINAL HYSTERECTOMY     CHOLECYSTECTOMY N/A 03/02/2015   Procedure: LAPAROSCOPIC CHOLECYSTECTOMY;  Surgeon: Mark Jenkins, MD;  Location: [facility];  Service: General;  Laterality: N/A;   CYSTOSCOPY W/ URETERAL STENT PLACEMENT Bilateral 02/02/2022   Procedure: CYSTOSCOPY WITH RETROGRADE PYELOGRAM;  Surgeon: Jynesis Nakamura L, MD;  Location: [facility];  Service: Urology;  Laterality: Bilateral;   HOLMIUM LASER APPLICATION Right 05/02/2019   Procedure: HOLMIUM LASER APPLICATION;  Surgeon: Dahlstedt, Stephen, MD;  Location: [facility];  Service: Urology;  Laterality: Right;   IR URETERAL STENT RIGHT NEW ACCESS W/O SEP NEPHROSTOMY CATH  05/02/2019   KNEE ARTHROSCOPY Right    LIVER BIOPSY  03/02/2015   Procedure: LAPAROSCOPIC LIVER BIOPSY;  Surgeon: Mark Jenkins, MD;  Location: [facility];  Service: General;;   NEPHROLITHOTOMY Right 05/02/2019   Procedure: NEPHROLITHOTOMY PERCUTANEOUS;  Surgeon: Dahlstedt, Stephen, MD;  Location: [facility];  Service: Urology;  Laterality: Right;  3 HRS   THYROIDECTOMY     TONSILLECTOMY  1970   TUBAL LIGATION      Home Medications:  Allergies as of 02/16/2022       Reactions   Amlodipine    Swelling   Keflex [cephalexin] Rash        Medication List         Accurate as of February 16, 2022  9:19 AM. If you have any questions, ask your nurse or doctor.          APPLE CIDER VINEGAR PO Take 1 tablet by mouth daily.   cetirizine 10 MG tablet Commonly known as: ZYRTEC Take 10 mg by mouth daily.   CINNAMON PO Take 1,000 mg by mouth daily.   Dilt-XR 180 MG 24 hr capsule Generic drug: diltiazem Take 180 mg by mouth daily.   escitalopram 5 MG tablet Commonly known as: LEXAPRO Take 5 mg by mouth daily.   Fish Oil 1000 MG Caps Take 1 capsule by mouth daily.   HYDROcodone-acetaminophen 5-325 MG tablet Commonly known as: Norco Take 1 tablet by mouth every 6 (six) hours as needed for moderate pain.   levothyroxine 175 MCG tablet Commonly known as: SYNTHROID Take 175 mcg by mouth daily.   meloxicam 15 MG tablet Commonly known as: MOBIC Take 15 mg by mouth as needed for pain.   metFORMIN 500 MG tablet Commonly known as: GLUCOPHAGE Take 500 mg by mouth 2 (two) times daily.   ondansetron 4 MG tablet Commonly known as: ZOFRAN Take 1 tablet (4 mg total) by mouth every 6 (six) hours.   pregabalin 200 MG capsule Commonly known as: LYRICA Take 200 mg by mouth 2 (two) times daily.   simvastatin 20 MG tablet Commonly known as: ZOCOR Take 20 mg by mouth daily.   solifenacin 10 MG   tablet Commonly known as: VESICARE Take 1 tablet (10 mg total) by mouth daily.   sulfamethoxazole-trimethoprim 800-160 MG tablet Commonly known as: BACTRIM DS Take 1 tablet by mouth 2 (two) times daily for 7 days.   Tiadylt ER 360 MG 24 hr capsule Generic drug: diltiazem Take 360 mg by mouth daily.        Allergies:  Allergies  Allergen Reactions   Amlodipine     Swelling   Keflex [Cephalexin] Rash    Family History: No family history on file.  Social History:  reports that she has never smoked. She has never used smokeless tobacco. She reports that she does not drink alcohol and does not use drugs.  ROS: All other review of  systems were reviewed and are negative except what is noted above in HPI  Physical Exam: BP 123/67   Pulse 89   Constitutional:  Alert and oriented, No acute distress. HEENT: [place] AT, moist mucus membranes.  Trachea midline, no masses. Cardiovascular: No clubbing, cyanosis, or edema. Respiratory: Normal respiratory effort, no increased work of breathing. GI: Abdomen is soft, nontender, nondistended, no abdominal masses GU: No CVA tenderness.  Lymph: No cervical or inguinal lymphadenopathy. Skin: No rashes, bruises or suspicious lesions. Neurologic: Grossly intact, no focal deficits, moving all 4 extremities. Psychiatric: Normal mood and affect.  Laboratory Data: Lab Results  Component Value Date   WBC 13.3 (H) 02/12/2022   HGB 11.4 (L) 02/12/2022   HCT 34.4 (L) 02/12/2022   MCV 89.6 02/12/2022   PLT 361 02/12/2022    Lab Results  Component Value Date   CREATININE 1.96 (H) 02/12/2022    No results found for: "PSA"  No results found for: "TESTOSTERONE"  Lab Results  Component Value Date   HGBA1C 6.9 (H) 04/26/2019    Urinalysis    Component Value Date/Time   COLORURINE YELLOW 02/12/2022 1954   APPEARANCEUR HAZY (A) 02/12/2022 1954   APPEARANCEUR Clear 02/11/2022 1018   LABSPEC 1.005 02/12/2022 1954   PHURINE 6.0 02/12/2022 1954   GLUCOSEU NEGATIVE 02/12/2022 1954   HGBUR MODERATE (A) 02/12/2022 1954   BILIRUBINUR NEGATIVE 02/12/2022 1954   BILIRUBINUR Negative 02/11/2022 1018   KETONESUR NEGATIVE 02/12/2022 1954   PROTEINUR 100 (A) 02/12/2022 1954   UROBILINOGEN 0.2 08/06/2019 1043   UROBILINOGEN 0.2 08/12/2009 1550   NITRITE NEGATIVE 02/12/2022 1954   LEUKOCYTESUR SMALL (A) 02/12/2022 1954    Lab Results  Component Value Date   LABMICR See below: 02/11/2022   WBCUA 6-10 (A) 02/11/2022   LABEPIT 0-10 02/11/2022   MUCUS Present 02/11/2022   BACTERIA NONE SEEN 02/12/2022    Pertinent Imaging: CT 02/12/2022: Images reviewed and discussed with the patient   Results for orders placed during the hospital encounter of 02/01/22  DG Abd 1 View  Narrative CLINICAL DATA:  Nephrolithiasis.  EXAM: ABDOMEN - 1 VIEW  COMPARISON:  Radiograph of February 04, 2020. CT scan February 01, 2022.  FINDINGS: The bowel gas pattern is normal. No definite nephrolithiasis is noted. Phleboliths are noted in the pelvis. Large distal right ureteral calculus is noted as described on prior CT scan.  IMPRESSION: Large distal right ureteral calculus is noted as described on prior CT scan.   Electronically Signed By: Lupita Raider M.D. On: 02/02/2022 09:53  No results found for this or any previous visit.  No results found for this or any previous visit.  No results found for this or any previous visit.  Results for orders placed during the  hospital encounter of 02/01/22  US RENAL  Narrative CLINICAL DATA:  RIGHT ureteral calculus with trace RIGHT hydronephrosis on CT, diabetes mellitus, hypertension  EXAM: RENAL / URINARY TRACT ULTRASOUND COMPLETE  COMPARISON:  CT abdomen and pelvis 02/01/2022  FINDINGS: Right Kidney:  Renal measurements: 13.2 x 6.6 x 6.3 cm = volume: 287 mL. Normal cortical echogenicity. Diffuse cortical thinning. No mass or hydronephrosis. No shadowing calcification.  Left Kidney:  Renal measurements: 12.6 x 7.1 x 6.7 cm = volume: 317 mL. Normal cortical thickness and echogenicity. No mass, hydronephrosis or shadowing calcification.  Bladder:  Distended urinary bladder, volume 781 mL. Ureteral jets were not visualized during the interval of imaging. No gross bladder mass or wall thickening.  Other:  N/A  IMPRESSION: Distended urinary bladder.  No evidence of renal mass or hydronephrosis.   Electronically Signed By: Ulyses Southward M.D. On: 02/02/2022 08:45  No results found for this or any previous visit.  No results found for this or any previous visit.  Results for orders placed during the hospital  encounter of 02/12/22  CT Renal Stone Study  Narrative CLINICAL DATA:  71 year old female with urinary retention and abdominal pain. Patient has bilateral urinary stents.  EXAM: CT ABDOMEN AND PELVIS WITHOUT CONTRAST  TECHNIQUE: Multidetector CT imaging of the abdomen and pelvis was performed following the standard protocol without IV contrast.  RADIATION DOSE REDUCTION: This exam was performed according to the departmental dose-optimization program which includes automated exposure control, adjustment of the mA and/or kV according to patient size and/or use of iterative reconstruction technique.  COMPARISON:  02/01/2022 CT and prior studies  FINDINGS: Please note that parenchymal and vascular abnormalities may be missed as intravenous contrast was not administered.  Lower chest: No acute abnormality.  Hepatobiliary: No hepatic abnormalities are identified. The patient is status post cholecystectomy. There is no evidence of intrahepatic or extrahepatic biliary dilatation.  Pancreas: Unremarkable  Spleen: Unremarkable  Adrenals/Urinary Tract: Bilateral ureteral stents are now identified. The RIGHT ureteral stent tips lie in the RIGHT renal pelvis and bladder. The LEFT ureteral stent tips lie in the renal pelvis/upper collecting system and within the bladder. There is mild RIGHT hydronephrosis, slightly increased since 02/01/2022.  An equivocal faint 4 mm calcification/calculus along the RIGHT ureteral stent in the distal ureter is noted (image 80: Series 2).  A LEFT renal cyst is again noted and no imaging follow-up is recommended.  The adrenal glands are unremarkable.  Stomach/Bowel: Stomach is within normal limits. Appendix appears normal. No evidence of bowel wall thickening, distention, or inflammatory changes. Colonic diverticulosis identified without evidence of acute diverticulitis.  Vascular/Lymphatic: Aortic atherosclerosis. No enlarged abdominal  or pelvic lymph nodes.  Reproductive: Status post hysterectomy. No adnexal masses.  Other: No ascites, focal collection or pneumoperitoneum.  Musculoskeletal: No acute or suspicious bony abnormalities are noted. Mild degenerative changes in the lumbar spine again noted.  IMPRESSION: 1. Bilateral ureteral stents with mild RIGHT hydronephrosis, slightly increased since 02/01/2022. Equivocal faint 4 mm calcification/calculus along the RIGHT ureteral stent in the distal RIGHT ureter. 2. Aortic Atherosclerosis (ICD10-I70.0).   Electronically Signed By: Harmon Pier M.D. On: 02/12/2022 19:05   Assessment & Plan:    1. Renal calculi -Patient scheduled for bilateral ureteroscopic stone extraction  2. Urinary retention -Patient to stop vesicare. She will followup Tuesday for a voiding trial   No follow-ups on file.  Wilkie Aye, MD  [facility] [place]

## 2022-02-17 ENCOUNTER — Encounter: Payer: Self-pay | Admitting: Urology

## 2022-02-17 NOTE — Patient Instructions (Signed)

## 2022-02-18 DIAGNOSIS — R251 Tremor, unspecified: Secondary | ICD-10-CM | POA: Diagnosis not present

## 2022-02-18 DIAGNOSIS — N200 Calculus of kidney: Secondary | ICD-10-CM | POA: Diagnosis not present

## 2022-02-18 DIAGNOSIS — A419 Sepsis, unspecified organism: Secondary | ICD-10-CM | POA: Diagnosis not present

## 2022-02-18 DIAGNOSIS — N179 Acute kidney failure, unspecified: Secondary | ICD-10-CM | POA: Diagnosis not present

## 2022-02-18 DIAGNOSIS — E663 Overweight: Secondary | ICD-10-CM | POA: Diagnosis not present

## 2022-02-18 DIAGNOSIS — Z6828 Body mass index (BMI) 28.0-28.9, adult: Secondary | ICD-10-CM | POA: Diagnosis not present

## 2022-02-22 ENCOUNTER — Ambulatory Visit: Payer: PPO | Admitting: Physician Assistant

## 2022-02-22 VITALS — BP 129/77 | HR 83

## 2022-02-22 DIAGNOSIS — N200 Calculus of kidney: Secondary | ICD-10-CM | POA: Diagnosis not present

## 2022-02-22 DIAGNOSIS — R339 Retention of urine, unspecified: Secondary | ICD-10-CM | POA: Diagnosis not present

## 2022-02-22 DIAGNOSIS — Z23 Encounter for immunization: Secondary | ICD-10-CM | POA: Diagnosis not present

## 2022-02-22 NOTE — Progress Notes (Signed)
Assessment: 1. Urinary retention  2. Renal calculi    Plan: Voiding trial successful. Pt will FU this afternoon if any concerns with retention. Advised to take meds for pain as needed and continue to increase fluid intake FU post op. Strict FU and ED precautions given.   Chief Complaint: No chief complaint on file.   HPI: Patricia [name] is a 71 y.o. female with history of bilateral ureteral stent placement in early August for acute renal failure and sepsis who presents for continued evaluation of acute urinary retention onset 02/12/2022.  She was seen last week and advised to discontinue Vesicare.  Voiding trial scheduled for today. Pt scheduled for B stone extractions on Sept 14. Pt has had left sided flank pain which persists since stent placement. Has hydrocodone, but taking nothing for it. No hematuria, fever, chills.   02/16/22 Patricia [name] here for for followup for nephrolithiasis and urinary retention. She developed urinary retention Saturday and had a foley placed. She has been on vesicare since the 8/15. Urine is clear. No pain with the catheter in place. No other complaints today.    02/11/22 Patricia [name] is a 71yo here for followup after bilateral ureteral stent placement. She underwent stent placement 2 weeks ago for acute renal failure and sepsis from a urinary source. Creatinine normalized. She has mild bilateral flank pain with the stents in place. No hematuria. She has urinary urgency and frequency. She is currently on vesicare 10mg  daily  Portions of the above documentation were copied from a prior visit for review purposes only.  Allergies: Allergies  Allergen Reactions   Amlodipine     Swelling   Keflex [Cephalexin] Rash    PMH: Past Medical History:  Diagnosis Date   Arthritis    Diabetes mellitus without complication (HCC)    Hypertension    Hypothyroidism    PONV (postoperative nausea and vomiting)     PSH: Past Surgical History:  Procedure  Laterality Date   ABDOMINAL HYSTERECTOMY     CHOLECYSTECTOMY N/A 03/02/2015   Procedure: LAPAROSCOPIC CHOLECYSTECTOMY;  Surgeon: Aviva Signs, MD;  Location: [facility];  Service: General;  Laterality: N/A;   CYSTOSCOPY W/ URETERAL STENT PLACEMENT Bilateral 02/02/2022   Procedure: CYSTOSCOPY WITH RETROGRADE PYELOGRAM;  Surgeon: Cleon Gustin, MD;  Location: [facility];  Service: Urology;  Laterality: Bilateral;   HOLMIUM LASER APPLICATION Right [id number]   Procedure: HOLMIUM LASER APPLICATION;  Surgeon: Franchot Gallo, MD;  Location: [facility];  Service: Urology;  Laterality: Right;   IR URETERAL STENT RIGHT NEW ACCESS W/O SEP NEPHROSTOMY CATH  05/02/2019   KNEE ARTHROSCOPY Right    LIVER BIOPSY  03/02/2015   Procedure: LAPAROSCOPIC LIVER BIOPSY;  Surgeon: Aviva Signs, MD;  Location: [facility];  Service: General;;   NEPHROLITHOTOMY Right 05/02/2019   Procedure: NEPHROLITHOTOMY PERCUTANEOUS;  Surgeon: Franchot Gallo, MD;  Location: [facility];  Service: Urology;  Laterality: Right;  3 HRS   THYROIDECTOMY     TONSILLECTOMY  1970   TUBAL LIGATION      SH: Social History   Tobacco Use   Smoking status: Never   Smokeless tobacco: Never  Vaping Use   Vaping Use: Never used  Substance Use Topics   Alcohol use: No   Drug use: No    ROS: All other review of systems were reviewed and are negative except what is noted above in HPI  PE: BP 129/77   Pulse 83  GENERAL APPEARANCE:  Well appearing, well developed, well nourished,  NAD HEENT:  Atraumatic, normocephalic NECK:  Supple. Trachea midline ABDOMEN:  Soft, non-tender, no masses EXTREMITIES:  Moves all extremities well, without clubbing, cyanosis, or edema NEUROLOGIC:  Alert and oriented x 3, normal gait MENTAL STATUS:  appropriate BACK:  Non-tender to palpation, Left sided CVAT SKIN:  Warm, dry, and intact   Results: Laboratory Data: Lab Results  Component Value Date   WBC 13.3 (H) 02/12/2022   HGB 11.4 (L) 02/12/2022   HCT  34.4 (L) 02/12/2022   MCV 89.6 02/12/2022   PLT 361 02/12/2022    Lab Results  Component Value Date   CREATININE 1.96 (H) 02/12/2022    Lab Results  Component Value Date   HGBA1C 6.9 (H) 04/26/2019    Urinalysis    Component Value Date/Time   COLORURINE YELLOW 02/12/2022 1954   APPEARANCEUR HAZY (A) 02/12/2022 1954   APPEARANCEUR Clear 02/11/2022 1018   LABSPEC 1.005 02/12/2022 1954   PHURINE 6.0 02/12/2022 1954   GLUCOSEU NEGATIVE 02/12/2022 1954   HGBUR MODERATE (A) 02/12/2022 1954   BILIRUBINUR NEGATIVE 02/12/2022 1954   BILIRUBINUR Negative 02/11/2022 1018   KETONESUR NEGATIVE 02/12/2022 1954   PROTEINUR 100 (A) 02/12/2022 1954   UROBILINOGEN 0.2 08/06/2019 1043   UROBILINOGEN 0.2 08/12/2009 1550   NITRITE NEGATIVE 02/12/2022 1954   LEUKOCYTESUR SMALL (A) 02/12/2022 1954    Lab Results  Component Value Date   LABMICR See below: 02/11/2022   WBCUA 6-10 (A) 02/11/2022   LABEPIT 0-10 02/11/2022   MUCUS Present 02/11/2022   BACTERIA NONE SEEN 02/12/2022    Pertinent Imaging: Results for orders placed during the hospital encounter of 02/01/22  DG Abd 1 View  Narrative CLINICAL DATA:  Nephrolithiasis.  EXAM: ABDOMEN - 1 VIEW  COMPARISON:  Radiograph of February 04, 2020. CT scan February 01, 2022.  FINDINGS: The bowel gas pattern is normal. No definite nephrolithiasis is noted. Phleboliths are noted in the pelvis. Large distal right ureteral calculus is noted as described on prior CT scan.  IMPRESSION: Large distal right ureteral calculus is noted as described on prior CT scan.   Electronically Signed By: Lupita Raider M.D. On: 02/02/2022 09:53  No results found for this or any previous visit.  No results found for this or any previous visit.  No results found for this or any previous visit.  Results for orders placed during the hospital encounter of 02/01/22  US RENAL  Narrative CLINICAL DATA:  RIGHT ureteral calculus with trace  RIGHT hydronephrosis on CT, diabetes mellitus, hypertension  EXAM: RENAL / URINARY TRACT ULTRASOUND COMPLETE  COMPARISON:  CT abdomen and pelvis 02/01/2022  FINDINGS: Right Kidney:  Renal measurements: 13.2 x 6.6 x 6.3 cm = volume: 287 mL. Normal cortical echogenicity. Diffuse cortical thinning. No mass or hydronephrosis. No shadowing calcification.  Left Kidney:  Renal measurements: 12.6 x 7.1 x 6.7 cm = volume: 317 mL. Normal cortical thickness and echogenicity. No mass, hydronephrosis or shadowing calcification.  Bladder:  Distended urinary bladder, volume 781 mL. Ureteral jets were not visualized during the interval of imaging. No gross bladder mass or wall thickening.  Other:  N/A  IMPRESSION: Distended urinary bladder.  No evidence of renal mass or hydronephrosis.   Electronically Signed By: Ulyses Southward M.D. On: 02/02/2022 08:45  No results found for this or any previous visit.  No results found for this or any previous visit.  Results for orders placed during the hospital encounter of 02/12/22  CT Renal Stone Study  Narrative CLINICAL DATA:  71 year old female with urinary retention and abdominal pain. Patient has bilateral urinary stents.  EXAM: CT ABDOMEN AND PELVIS WITHOUT CONTRAST  TECHNIQUE: Multidetector CT imaging of the abdomen and pelvis was performed following the standard protocol without IV contrast.  RADIATION DOSE REDUCTION: This exam was performed according to the departmental dose-optimization program which includes automated exposure control, adjustment of the mA and/or kV according to patient size and/or use of iterative reconstruction technique.  COMPARISON:  02/01/2022 CT and prior studies  FINDINGS: Please note that parenchymal and vascular abnormalities may be missed as intravenous contrast was not administered.  Lower chest: No acute abnormality.  Hepatobiliary: No hepatic abnormalities are identified. The  patient is status post cholecystectomy. There is no evidence of intrahepatic or extrahepatic biliary dilatation.  Pancreas: Unremarkable  Spleen: Unremarkable  Adrenals/Urinary Tract: Bilateral ureteral stents are now identified. The RIGHT ureteral stent tips lie in the RIGHT renal pelvis and bladder. The LEFT ureteral stent tips lie in the renal pelvis/upper collecting system and within the bladder. There is mild RIGHT hydronephrosis, slightly increased since 02/01/2022.  An equivocal faint 4 mm calcification/calculus along the RIGHT ureteral stent in the distal ureter is noted (image 80: Series 2).  A LEFT renal cyst is again noted and no imaging follow-up is recommended.  The adrenal glands are unremarkable.  Stomach/Bowel: Stomach is within normal limits. Appendix appears normal. No evidence of bowel wall thickening, distention, or inflammatory changes. Colonic diverticulosis identified without evidence of acute diverticulitis.  Vascular/Lymphatic: Aortic atherosclerosis. No enlarged abdominal or pelvic lymph nodes.  Reproductive: Status post hysterectomy. No adnexal masses.  Other: No ascites, focal collection or pneumoperitoneum.  Musculoskeletal: No acute or suspicious bony abnormalities are noted. Mild degenerative changes in the lumbar spine again noted.  IMPRESSION: 1. Bilateral ureteral stents with mild RIGHT hydronephrosis, slightly increased since 02/01/2022. Equivocal faint 4 mm calcification/calculus along the RIGHT ureteral stent in the distal RIGHT ureter. 2. Aortic Atherosclerosis (ICD10-I70.0).   Electronically Signed By: Harmon Pier M.D. On: 02/12/2022 19:05  No results found for this or any previous visit (from the past 24 hour(s)).

## 2022-02-22 NOTE — Progress Notes (Signed)
Fill and Pull Catheter Removal  Patient is present today for a catheter removal.  Patient was cleaned and prepped in a sterile fashion 350 ml of sterile water/ saline was instilled into the bladder when the patient felt the urge to urinate. 10 ml of water was then drained from the balloon.  A 16FR foley cath was removed from the bladder no complications were noted .  Patient as then given some time to void on their own.  Patient can void  300 ml on their own after some time.  Patient tolerated well.  Performed by: Carollee Leitz, CMA  Follow up/ Additional notes: Follow up as schedule

## 2022-02-23 DIAGNOSIS — E875 Hyperkalemia: Secondary | ICD-10-CM | POA: Diagnosis not present

## 2022-03-01 ENCOUNTER — Other Ambulatory Visit: Payer: Self-pay

## 2022-03-01 ENCOUNTER — Encounter (HOSPITAL_COMMUNITY): Payer: Self-pay

## 2022-03-01 ENCOUNTER — Encounter (HOSPITAL_COMMUNITY)
Admission: RE | Admit: 2022-03-01 | Discharge: 2022-03-01 | Disposition: A | Discharge status: Home / Self Care | Payer: PPO | Source: Ambulatory Visit | Attending: Urology | Admitting: Urology

## 2022-03-03 ENCOUNTER — Ambulatory Visit (HOSPITAL_BASED_OUTPATIENT_CLINIC_OR_DEPARTMENT_OTHER): Payer: PPO | Admitting: Anesthesiology

## 2022-03-03 ENCOUNTER — Ambulatory Visit (HOSPITAL_COMMUNITY)
Admission: RE | Admit: 2022-03-03 | Discharge: 2022-03-03 | Disposition: A | Discharge status: Home / Self Care | Payer: PPO | Attending: Urology | Admitting: Urology

## 2022-03-03 ENCOUNTER — Encounter (HOSPITAL_COMMUNITY): Payer: Self-pay | Admitting: Urology

## 2022-03-03 ENCOUNTER — Ambulatory Visit (HOSPITAL_COMMUNITY): Payer: PPO

## 2022-03-03 ENCOUNTER — Ambulatory Visit (HOSPITAL_COMMUNITY): Payer: PPO | Admitting: Anesthesiology

## 2022-03-03 ENCOUNTER — Encounter (HOSPITAL_COMMUNITY)
Admission: RE | Disposition: A | Discharge status: Home / Self Care | Payer: Self-pay | Source: Home / Self Care | Attending: Urology

## 2022-03-03 DIAGNOSIS — M1990 Unspecified osteoarthritis, unspecified site: Secondary | ICD-10-CM | POA: Insufficient documentation

## 2022-03-03 DIAGNOSIS — Z79899 Other long term (current) drug therapy: Secondary | ICD-10-CM | POA: Insufficient documentation

## 2022-03-03 DIAGNOSIS — N200 Calculus of kidney: Secondary | ICD-10-CM

## 2022-03-03 DIAGNOSIS — Z7984 Long term (current) use of oral hypoglycemic drugs: Secondary | ICD-10-CM

## 2022-03-03 DIAGNOSIS — E119 Type 2 diabetes mellitus without complications: Secondary | ICD-10-CM | POA: Diagnosis not present

## 2022-03-03 DIAGNOSIS — I10 Essential (primary) hypertension: Secondary | ICD-10-CM | POA: Insufficient documentation

## 2022-03-03 DIAGNOSIS — N201 Calculus of ureter: Secondary | ICD-10-CM | POA: Diagnosis not present

## 2022-03-03 DIAGNOSIS — E039 Hypothyroidism, unspecified: Secondary | ICD-10-CM | POA: Insufficient documentation

## 2022-03-03 HISTORY — PX: CYSTOSCOPY WITH RETROGRADE PYELOGRAM, URETEROSCOPY AND STENT PLACEMENT: SHX5789

## 2022-03-03 LAB — GLUCOSE, CAPILLARY: Glucose-Capillary: 121 mg/dL — ABNORMAL HIGH (ref 70–99)

## 2022-03-03 SURGERY — CYSTOURETEROSCOPY, WITH RETROGRADE PYELOGRAM AND STENT INSERTION
Anesthesia: General | Site: Ureter | Laterality: Bilateral

## 2022-03-03 MED ORDER — EPHEDRINE SULFATE-NACL 50-0.9 MG/10ML-% IV SOSY
PREFILLED_SYRINGE | INTRAVENOUS | Status: DC | PRN
  Administered 2022-03-03: 5 mg via INTRAVENOUS

## 2022-03-03 MED ORDER — SODIUM CHLORIDE 0.9 % IV SOLN
INTRAVENOUS | Status: AC
  Filled 2022-03-03: qty 10

## 2022-03-03 MED ORDER — DIATRIZOATE MEGLUMINE 30 % UR SOLN
URETHRAL | Status: AC
  Filled 2022-03-03: qty 100

## 2022-03-03 MED ORDER — ONDANSETRON HCL 4 MG/2ML IJ SOLN
INTRAMUSCULAR | Status: DC | PRN
  Administered 2022-03-03: 4 mg via INTRAVENOUS

## 2022-03-03 MED ORDER — IPRATROPIUM-ALBUTEROL 0.5-2.5 (3) MG/3ML IN SOLN: 3.0000 mL | RESPIRATORY_TRACT | Status: DC

## 2022-03-03 MED ORDER — IPRATROPIUM-ALBUTEROL 0.5-2.5 (3) MG/3ML IN SOLN: 3.0000 mL | Freq: Once | RESPIRATORY_TRACT | Status: DC

## 2022-03-03 MED ORDER — HYDROCODONE-ACETAMINOPHEN 5-325 MG PO TABS: 1.0000 | ORAL_TABLET | Freq: Four times a day (QID) | ORAL | 0 refills | Status: DC | PRN

## 2022-03-03 MED ORDER — SODIUM CHLORIDE 0.9 % IV SOLN
INTRAVENOUS | Status: AC
  Filled 2022-03-03: qty 20

## 2022-03-03 MED ORDER — LIDOCAINE HCL (CARDIAC) PF 100 MG/5ML IV SOSY
PREFILLED_SYRINGE | INTRAVENOUS | Status: DC | PRN
  Administered 2022-03-03: 60 mg via INTRAVENOUS

## 2022-03-03 MED ORDER — ORAL CARE MOUTH RINSE: 15.0000 mL | Freq: Once | OROMUCOSAL | Status: AC

## 2022-03-03 MED ORDER — SODIUM CHLORIDE 0.9 % IV SOLN: INTRAVENOUS | Status: DC

## 2022-03-03 MED ORDER — PROPOFOL 10 MG/ML IV BOLUS
INTRAVENOUS | Status: DC | PRN
  Administered 2022-03-03 (×2): 50 mg via INTRAVENOUS
  Administered 2022-03-03: 150 mg via INTRAVENOUS
  Administered 2022-03-03: 50 mg via INTRAVENOUS

## 2022-03-03 MED ORDER — SODIUM CHLORIDE 0.9 % IR SOLN
Status: DC | PRN
  Administered 2022-03-03 (×2): 3000 mL

## 2022-03-03 MED ORDER — DIATRIZOATE MEGLUMINE 30 % UR SOLN
URETHRAL | Status: DC | PRN
  Administered 2022-03-03: 18 mL via URETHRAL

## 2022-03-03 MED ORDER — WATER FOR IRRIGATION, STERILE IR SOLN
Status: DC | PRN
  Administered 2022-03-03: 500 mL

## 2022-03-03 MED ORDER — LACTATED RINGERS IV SOLN
INTRAVENOUS | Status: DC
Start: 2022-03-03 — End: 2022-03-03

## 2022-03-03 MED ORDER — PROPOFOL 10 MG/ML IV BOLUS
INTRAVENOUS | Status: AC
  Filled 2022-03-03: qty 20

## 2022-03-03 MED ORDER — CHLORHEXIDINE GLUCONATE 0.12 % MT SOLN
15.0000 mL | Freq: Once | OROMUCOSAL | Status: AC
  Administered 2022-03-03: 15 mL via OROMUCOSAL

## 2022-03-03 MED ORDER — FENTANYL CITRATE (PF) 100 MCG/2ML IJ SOLN
INTRAMUSCULAR | Status: DC | PRN
  Administered 2022-03-03 (×2): 50 ug via INTRAVENOUS

## 2022-03-03 MED ORDER — IPRATROPIUM-ALBUTEROL 0.5-2.5 (3) MG/3ML IN SOLN
RESPIRATORY_TRACT | Status: AC
  Filled 2022-03-03: qty 3

## 2022-03-03 MED ORDER — SODIUM CHLORIDE 0.9 % IV SOLN
1.0000 g | INTRAVENOUS | Status: AC
  Administered 2022-03-03: 1 g via INTRAVENOUS

## 2022-03-03 MED ORDER — ONDANSETRON HCL 4 MG/2ML IJ SOLN: 4.0000 mg | Freq: Once | INTRAMUSCULAR | Status: DC | PRN

## 2022-03-03 MED ORDER — EPHEDRINE 5 MG/ML INJ
INTRAVENOUS | Status: AC
  Filled 2022-03-03: qty 5

## 2022-03-03 MED ORDER — HYDROMORPHONE HCL 1 MG/ML IJ SOLN: 0.2500 mg | INTRAMUSCULAR | Status: DC | PRN

## 2022-03-03 MED ORDER — DEXAMETHASONE SODIUM PHOSPHATE 4 MG/ML IJ SOLN
INTRAMUSCULAR | Status: DC | PRN
  Administered 2022-03-03: 4 mg via INTRAVENOUS

## 2022-03-03 MED ORDER — FENTANYL CITRATE (PF) 100 MCG/2ML IJ SOLN
INTRAMUSCULAR | Status: AC
  Filled 2022-03-03: qty 2

## 2022-03-03 SURGICAL SUPPLY — 23 items
BAG DRAIN URO TABLE W/ADPT NS (BAG) ×2 IMPLANT
BAG DRN 8 ADPR NS SKTRN CSTL (BAG) ×2
BAG HAMPER (MISCELLANEOUS) ×2 IMPLANT
CATH INTERMIT  6FR 70CM (CATHETERS) ×2 IMPLANT
CLOTH BEACON ORANGE TIMEOUT ST (SAFETY) ×2 IMPLANT
DECANTER SPIKE VIAL GLASS SM (MISCELLANEOUS) ×2 IMPLANT
EXTRACTOR STONE NITINOL NGAGE (UROLOGICAL SUPPLIES) ×1 IMPLANT
GLOVE BIO SURGEON STRL SZ8 (GLOVE) ×2 IMPLANT
GLOVE BIOGEL PI IND STRL 7.0 (GLOVE) ×4 IMPLANT
GOWN STRL REUS W/TWL LRG LVL3 (GOWN DISPOSABLE) ×2 IMPLANT
GOWN STRL REUS W/TWL XL LVL3 (GOWN DISPOSABLE) ×2 IMPLANT
GUIDEWIRE STR DUAL SENSOR (WIRE) ×2 IMPLANT
GUIDEWIRE STR ZIPWIRE 035X150 (MISCELLANEOUS) ×2 IMPLANT
IV NS IRRIG 3000ML ARTHROMATIC (IV SOLUTION) ×4 IMPLANT
KIT TURNOVER CYSTO (KITS) ×2 IMPLANT
MANIFOLD NEPTUNE II (INSTRUMENTS) ×2 IMPLANT
PACK CYSTO (CUSTOM PROCEDURE TRAY) ×2 IMPLANT
PAD ARMBOARD 7.5X6 YLW CONV (MISCELLANEOUS) ×2 IMPLANT
STENT URET 6FRX26 CONTOUR (STENTS) ×1 IMPLANT
SYR 10ML LL (SYRINGE) ×2 IMPLANT
SYR CONTROL 10ML LL (SYRINGE) ×2 IMPLANT
TOWEL OR 17X26 4PK STRL BLUE (TOWEL DISPOSABLE) ×2 IMPLANT
WATER STERILE IRR 500ML POUR (IV SOLUTION) ×2 IMPLANT

## 2022-03-03 NOTE — Anesthesia Procedure Notes (Signed)
Procedure Name: LMA Insertion Date/Time: 03/03/2022 11:44 AM  Performed by: Caren Macadam, CRNAPre-anesthesia Checklist: Patient identified, Emergency Drugs available, Suction available and Patient being monitored Patient Re-evaluated:Patient Re-evaluated prior to induction Oxygen Delivery Method: Circle system utilized Preoxygenation: Pre-oxygenation with 100% oxygen Induction Type: IV induction Ventilation: Mask ventilation without difficulty LMA: LMA inserted LMA Size: 4.0 Number of attempts: 1 Airway Equipment and Method: Bite block Placement Confirmation: positive ETCO2 and breath sounds checked- equal and bilateral Tube secured with: Tape Dental Injury: Teeth and Oropharynx as per pre-operative assessment

## 2022-03-03 NOTE — Anesthesia Preprocedure Evaluation (Signed)
Anesthesia Evaluation  Patient identified by MRN, date of birth, ID band Patient awake    Reviewed: Allergy & Precautions, NPO status , Patient's Chart, lab work & pertinent test results  History of Anesthesia Complications (+) PONV and history of anesthetic complications  Airway Mallampati: II  TM Distance: >3 FB Neck ROM: Full    Dental  (+) Dental Advisory Given, Teeth Intact,    Pulmonary neg pulmonary ROS,    Pulmonary exam normal breath sounds clear to auscultation       Cardiovascular hypertension, Pt. on medications Normal cardiovascular exam Rhythm:Regular Rate:Normal  01-Feb-2022 14:26:17 [facility] ROUTINE RECORD February 02, 1951 (70 yr) Female Caucasian Vent. rate 58 BPM PR interval 161 ms QRS duration 98 ms QT/QTcB 395/392 ms P-R-T axes -18 -12 -48 Sinus rhythm Inferior infarct, old   Neuro/Psych negative neurological ROS  negative psych ROS   GI/Hepatic Neg liver ROS, GERD  Medicated,  Endo/Other  diabetes, Well Controlled, Type 2, Oral Hypoglycemic AgentsHypothyroidism   Renal/GU Renal disease  negative genitourinary   Musculoskeletal  (+) Arthritis , Osteoarthritis,    Abdominal   Peds negative pediatric ROS (+)  Hematology negative hematology ROS (+)   Anesthesia Other Findings   Reproductive/Obstetrics negative OB ROS                           Anesthesia Physical  Anesthesia Plan  ASA: 3  Anesthesia Plan: General   Post-op Pain Management: Dilaudid IV   Induction: Intravenous  PONV Risk Score and Plan: 4 or greater and Ondansetron, Metaclopromide and Dexamethasone  Airway Management Planned: LMA and Oral ETT  Additional Equipment:   Intra-op Plan:   Post-operative Plan: Extubation in OR  Informed Consent: I have reviewed the patients History and Physical, chart, labs and discussed the procedure including the risks, benefits and  alternatives for the proposed anesthesia with the patient or authorized representative who has indicated his/her understanding and acceptance.     Dental advisory given  Plan Discussed with: CRNA and Surgeon  Anesthesia Plan Comments: (Possible GA with airway was discussed.)      Anesthesia Quick Evaluation

## 2022-03-03 NOTE — Anesthesia Postprocedure Evaluation (Signed)
Anesthesia Post Note  Patient: Patricia [name]  Procedure(s) Performed: CYSTOSCOPY WITH RETROGRADE PYELOGRAM, URETEROSCOPY AND STENT PLACEMENT (Bilateral: Ureter)  Patient location during evaluation: PACU Anesthesia Type: General Level of consciousness: awake and alert and oriented Pain management: pain level controlled Vital Signs Assessment: post-procedure vital signs reviewed and stable Respiratory status: spontaneous breathing, nonlabored ventilation and respiratory function stable Cardiovascular status: blood pressure returned to baseline and stable Postop Assessment: no apparent nausea or vomiting Anesthetic complications: yes (possible aspiration.) Comments: As per patient her GERD was well controlled and only related to type food she eats, didn't have symptoms of GERD preoperatively. Patient was initially intubated with LMA 4, difficult to ventilate with LMA 4, anesthesia changed  LMA 4 to 3 and was able to ventilate. After the end of the procedure, patient was extubated and suctioned. Anesthesia noticed small amount of greenish fluid on back of the LMA. No free fluid in the pharynx, saturations in upper 80 s and low 90 s on room air in PACU, slight wheezing on right lower chest, duoneb nebulizer treatment was given, saturations improved 97 to 99% on room air. Wheezing improved. Explained to the patient that she might have aspirated small amout of gastric fluid. Advised patient to come to the ER if she has any respiratory symptoms like SOB, or chest pain, please go to ER immediately.    No notable events documented.   Last Vitals:  Vitals:   03/03/22 1245 03/03/22 1300  BP: (!) 121/59 (!) 121/57  Pulse: 67 70  Resp: 20 20  Temp:    SpO2: 93% 94%    Last Pain:  Vitals:   03/03/22 1232  TempSrc:   PainSc: 0-No pain                 Verdie Wilms C Chanette Demo

## 2022-03-03 NOTE — Transfer of Care (Signed)
Immediate Anesthesia Transfer of Care Note  Patient: Patricia [name]  Procedure(s) Performed: CYSTOSCOPY WITH RETROGRADE PYELOGRAM, URETEROSCOPY AND STENT PLACEMENT (Bilateral: Ureter)  Patient Location: PACU  Anesthesia Type:General  Level of Consciousness: awake and alert   Airway & Oxygen Therapy: Patient Spontanous Breathing and Patient connected to face mask oxygen  Post-op Assessment: Report given to RN and Post -op Vital signs reviewed and stable  Post vital signs: Reviewed and stable  Last Vitals:  Vitals Value Taken Time  BP    Temp    Pulse 83 03/03/22 1228  Resp 20 03/03/22 1228  SpO2 97 % 03/03/22 1228  Vitals shown include unvalidated device data.  Last Pain:  Vitals:   03/03/22 1019  TempSrc: Oral  PainSc: 0-No pain      Patients Stated Pain Goal: 6 (03/03/22 1004)  Complications: No notable events documented.

## 2022-03-03 NOTE — Op Note (Signed)
Preoperative diagnosis: bilateral ureteral stone  Postoperative diagnosis: Same  Procedure: 1 cystoscopy 2.  Bilateral retrograde pyelography 3.  Intraoperative fluoroscopy, under one hour, with interpretation 4.  Left diagnostic ureteroscopy 5.  Right ureteroscopic stone manipulation with basket extraction 5.  Right 6 x 26 JJ stent placement  Attending: Wilkie Aye  Anesthesia: General  Estimated blood loss: None  Drains: right 6 x 26 JJ ureteral stent with tether  Specimens: stone for analysis  Antibiotics: rocephin  Findings: No ureteral calculi visualized on left ureteroscopy. 69mm right distal ureteral calculus. No hydronephrosis in either collecting system. No masses/lesions in the bladder. Ureteral orifices in normal anatomic location.  Indications: Patient is a 71 year old female with a history of bilateral distal ureteral stone who was previously stented for sepsis from a urinary source. After discussing treatment options, they decided proceed with bilateral ureteroscopic stone manipulation.  Procedure in detail: The patient was brought to the operating room and a brief timeout was done to ensure correct patient, correct procedure, correct site.  General anesthesia was administered patient was placed in dorsal lithotomy position.  Her genitalia was then prepped and draped in usual sterile fashion.  A rigid 22 French cystoscope was passed in the urethra and the bladder.  Bladder was inspected free masses or lesions.  the ureteral orifices were in the normal orthotopic locations. Using a grasper the left ureteral stent was brought to the urethral meatus. Through the stent a zipwire was advanced up to the renal pelvis. The stent was then removed. a 6 french ureteral catheter was then instilled into the left ureteral orifice.  a gentle retrograde was obtained and findings noted above.  we then removed the cystoscope and cannulated the left ureteral orifice with a semirigid  ureteroscope.  We located no stone in the ureter. We then removed the scope and wire. We elected not to place a stent since this was an uncomplicated ureteroscopy.  We then turned out attention to the right side. Using a grasper the ureteral stent was brought to the urethral meatus. Through the stent a zipwire was advanced up to the renal pelvis. The stent was then removed. a 6 french ureteral catheter was then instilled into the right ureteral orifice.  a gentle retrograde was obtained and findings noted above.  we then removed the cystoscope and cannulated the right ureteral orifice with a semirigid ureteroscope.  We located the stone in the distal ureter. Using an NGage basket the stone was removed.  we then placed a 6 x 26 double-j ureteral stent over the original zip wire.  We then removed the wire and good coil was noted in the the renal pelvis under fluoroscopy and the bladder under direct vision. the stone fragments were then removed from the bladder and sent for analysis.   the bladder was then drained and this concluded the procedure which was well tolerated by patient.  Complications: None  Condition: Stable, extubated, transferred to PACU  Plan: Patient is to be discharged home as to follow-up in 1 week. She is to remove her stent in 72 hours by pulling the tether.

## 2022-03-03 NOTE — Interval H&P Note (Signed)
History and Physical Interval Note:  03/03/2022 11:18 AM  Patricia [name]  has presented today for surgery, with the diagnosis of bilateral ureteral calculi.  The various methods of treatment have been discussed with the patient and family. After consideration of risks, benefits and other options for treatment, the patient has consented to  Procedure(s): CYSTOSCOPY WITH RETROGRADE PYELOGRAM, URETEROSCOPY AND STENT PLACEMENT (Bilateral) HOLMIUM LASER APPLICATION (Bilateral) as a surgical intervention.  The patient's history has been reviewed, patient examined, no change in status, stable for surgery.  I have reviewed the patient's chart and labs.  Questions were answered to the patient's satisfaction.     Wilkie Aye

## 2022-03-04 ENCOUNTER — Encounter (HOSPITAL_COMMUNITY): Payer: Self-pay | Admitting: *Deleted

## 2022-03-04 ENCOUNTER — Other Ambulatory Visit: Payer: Self-pay

## 2022-03-04 ENCOUNTER — Emergency Department (HOSPITAL_COMMUNITY)
Admission: EM | Admit: 2022-03-04 | Discharge: 2022-03-04 | Disposition: A | Discharge status: Home / Self Care | Payer: PPO | Attending: Emergency Medicine | Admitting: Emergency Medicine

## 2022-03-04 DIAGNOSIS — D72829 Elevated white blood cell count, unspecified: Secondary | ICD-10-CM | POA: Diagnosis not present

## 2022-03-04 DIAGNOSIS — E1165 Type 2 diabetes mellitus with hyperglycemia: Secondary | ICD-10-CM | POA: Diagnosis not present

## 2022-03-04 DIAGNOSIS — I10 Essential (primary) hypertension: Secondary | ICD-10-CM | POA: Insufficient documentation

## 2022-03-04 DIAGNOSIS — Z7984 Long term (current) use of oral hypoglycemic drugs: Secondary | ICD-10-CM | POA: Insufficient documentation

## 2022-03-04 DIAGNOSIS — R32 Unspecified urinary incontinence: Secondary | ICD-10-CM | POA: Insufficient documentation

## 2022-03-04 DIAGNOSIS — Z87442 Personal history of urinary calculi: Secondary | ICD-10-CM | POA: Insufficient documentation

## 2022-03-04 LAB — CBC WITH DIFFERENTIAL/PLATELET
Abs Immature Granulocytes: 0.14 10*3/uL — ABNORMAL HIGH (ref 0.00–0.07)
Basophils Absolute: 0 10*3/uL (ref 0.0–0.1)
Basophils Relative: 0 %
Eosinophils Absolute: 0 10*3/uL (ref 0.0–0.5)
Eosinophils Relative: 0 %
HCT: 35.2 % — ABNORMAL LOW (ref 36.0–46.0)
Hemoglobin: 11.5 g/dL — ABNORMAL LOW (ref 12.0–15.0)
Immature Granulocytes: 1 %
Lymphocytes Relative: 14 %
Lymphs Abs: 2.4 10*3/uL (ref 0.7–4.0)
MCH: 29.6 pg (ref 26.0–34.0)
MCHC: 32.7 g/dL (ref 30.0–36.0)
MCV: 90.7 fL (ref 80.0–100.0)
Monocytes Absolute: 1.3 10*3/uL — ABNORMAL HIGH (ref 0.1–1.0)
Monocytes Relative: 8 %
Neutro Abs: 13.6 10*3/uL — ABNORMAL HIGH (ref 1.7–7.7)
Neutrophils Relative %: 77 %
Platelets: 312 10*3/uL (ref 150–400)
RBC: 3.88 MIL/uL (ref 3.87–5.11)
RDW: 15.1 % (ref 11.5–15.5)
WBC: 17.5 10*3/uL — ABNORMAL HIGH (ref 4.0–10.5)
nRBC: 0 % (ref 0.0–0.2)

## 2022-03-04 LAB — BASIC METABOLIC PANEL
Anion gap: 12 (ref 5–15)
BUN: 24 mg/dL — ABNORMAL HIGH (ref 8–23)
CO2: 23 mmol/L (ref 22–32)
Calcium: 9.3 mg/dL (ref 8.9–10.3)
Chloride: 106 mmol/L (ref 98–111)
Creatinine, Ser: 1.03 mg/dL — ABNORMAL HIGH (ref 0.44–1.00)
GFR, Estimated: 58 mL/min — ABNORMAL LOW (ref >60)
Glucose, Bld: 132 mg/dL — ABNORMAL HIGH (ref 70–99)
Potassium: 4.4 mmol/L (ref 3.5–5.1)
Sodium: 141 mmol/L (ref 135–145)

## 2022-03-04 LAB — URINALYSIS, ROUTINE W REFLEX MICROSCOPIC
Bacteria, UA: NONE SEEN
Bilirubin Urine: NEGATIVE
Glucose, UA: NEGATIVE mg/dL
Ketones, ur: NEGATIVE mg/dL
Nitrite: NEGATIVE
Protein, ur: 100 mg/dL — AB
Specific Gravity, Urine: 1.013 (ref 1.005–1.030)
pH: 5 (ref 5.0–8.0)

## 2022-03-04 NOTE — ED Triage Notes (Addendum)
Here for episode of urinary incontinence around 1130 s/p ureteral stents removed yesterday. Pt of [place]. Denies pain. C/o urgency but cannot void. Denies NVD, fever.

## 2022-03-04 NOTE — ED Provider Notes (Signed)
[facility] EMERGENCY DEPARTMENT Provider Note   CSN: [id number] Arrival date & time: 03/04/22  1350     History  Chief Complaint  Patient presents with   Urinary Frequency    Patricia [name] is a 71 y.o. female.  HPI   Patient with medical history including hypertension, diabetes, status post urinary stents presents with complaints of urinary incontinency.  Patient states starting today she was unable to control her bladder, states she was in the store and noticed that there was urine coming down her leg, she is not endorsing any urinary urgency, no dysuria or hematuria or denies lower pelvic pain or flank tenderness, no stomach pains no nausea or vomiting she is not endorsing any lower back pain no weakness paresthesias in her legs or saddle paresthesias, she is not having any complaints.  I reviewed patient's chart patient is under the care of Dr. Ronne Binning of urology, she had bilateral kidney stones, and was septic, she had stents placed in, she recent underwent cystoscopy yesterday with a note that there is no stone evident in the left ureter and stent was removed, right ureter revealed a small 4 mm distal stone which was removed, temporary stent was placed.  She was discharged home    Home Medications Prior to Admission medications   Medication Sig Start Date End Date Taking? Authorizing Provider  APPLE CIDER VINEGAR PO Take 1 tablet by mouth daily.    [provider]  cetirizine (ZYRTEC) 10 MG tablet Take 10 mg by mouth daily.    [provider]  CINNAMON PO Take 1,000 mg by mouth daily.    [provider]  escitalopram (LEXAPRO) 5 MG tablet Take 5 mg by mouth daily. 07/17/19   [provider]  HYDROcodone-acetaminophen (NORCO) 5-325 MG tablet Take 1 tablet by mouth every 6 (six) hours as needed for moderate pain. 03/03/22   McKenzie, Mardene Celeste, MD  levothyroxine (SYNTHROID) 175 MCG tablet Take 175 mcg by mouth daily. 11/10/21   [provider]  Melatonin 10 MG CAPS Take 10 mg by mouth at bedtime as needed (sleep).    [provider]  meloxicam (MOBIC) 15 MG tablet Take 15 mg by mouth as needed for pain.    [provider]  metFORMIN (GLUCOPHAGE) 500 MG tablet Take 500 mg by mouth 2 (two) times daily. 11/08/21   [provider]  Omega-3 Fatty Acids (FISH OIL) 1000 MG CAPS Take 1 capsule by mouth daily.    [provider]  ondansetron (ZOFRAN) 4 MG tablet Take 1 tablet (4 mg total) by mouth every 6 (six) hours. Patient not taking: Reported on 02/24/2022 01/30/22   Lonell Grandchild, MD  pregabalin (LYRICA) 200 MG capsule Take 200 mg by mouth 2 (two) times daily. 01/18/22   [provider]  simvastatin (ZOCOR) 20 MG tablet Take 20 mg by mouth daily.    [provider]  TIADYLT ER 360 MG 24 hr capsule Take 360 mg by mouth daily. 11/28/21   [provider]      Allergies    Amlodipine and Keflex [cephalexin]    Review of Systems   Review of Systems  Constitutional:  Negative for chills and fever.  Respiratory:  Negative for shortness of breath.   Cardiovascular:  Negative for chest pain.  Gastrointestinal:  Negative for abdominal pain.  Genitourinary:  Negative for difficulty urinating.  Neurological:  Negative for headaches.    Physical Exam Updated Vital Signs BP (!) 148/77 (  BP Location: Right Arm)   Pulse 78   Temp 97.7 F (36.5 C) (Oral)   Resp 18   Wt 80.7 kg   SpO2 100%   BMI 30.55 kg/m  Physical Exam Vitals and nursing note reviewed.  Constitutional:      General: She is not in acute distress.    Appearance: She is not ill-appearing.  HENT:     Head: Normocephalic and atraumatic.     Nose: No congestion.  Eyes:     Conjunctiva/sclera: Conjunctivae normal.  Cardiovascular:     Rate and Rhythm: Normal rate and regular rhythm.     Pulses: Normal pulses.     Heart sounds: No murmur heard.    No friction rub. No gallop.  Pulmonary:      Effort: No respiratory distress.     Breath sounds: No wheezing, rhonchi or rales.  Abdominal:     General: There is no distension.     Palpations: Abdomen is soft.     Tenderness: There is no right CVA tenderness or left CVA tenderness.     Comments: Stomach was soft nontender nondistended, no flank tenderness  Musculoskeletal:     Comments: Spine was palpated nontender to palpation no overlying skin changes, 5-5 strength in lower extremities, neurovascularly intact, ambulated out difficulty.  Skin:    General: Skin is warm and dry.  Neurological:     Mental Status: She is alert.  Psychiatric:        Mood and Affect: Mood normal.     ED Results / Procedures / Treatments   Labs (all labs ordered are listed, but only abnormal results are displayed) Labs Reviewed  URINALYSIS, ROUTINE W REFLEX MICROSCOPIC - Abnormal; Notable for the following components:      Result Value   Color, Urine STRAW (*)    Hgb urine dipstick SMALL (*)    Protein, ur 100 (*)    Leukocytes,Ua SMALL (*)    All other components within normal limits  BASIC METABOLIC PANEL - Abnormal; Notable for the following components:   Glucose, Bld 132 (*)    BUN 24 (*)    Creatinine, Ser 1.03 (*)    GFR, Estimated 58 (*)    All other components within normal limits  CBC WITH DIFFERENTIAL/PLATELET - Abnormal; Notable for the following components:   WBC 17.5 (*)    Hemoglobin 11.5 (*)    HCT 35.2 (*)    Neutro Abs 13.6 (*)    Monocytes Absolute 1.3 (*)    Abs Immature Granulocytes 0.14 (*)    All other components within normal limits    EKG None  Radiology DG C-Arm 1-60 Min-No Report  Result Date: 03/03/2022 Fluoroscopy was utilized by the requesting physician.  No radiographic interpretation.    Procedures Procedures    Medications Ordered in ED Medications - No data to display  ED Course/ Medical Decision Making/ A&P                           Medical Decision Making Amount and/or Complexity of  Data Reviewed Labs: ordered.   This patient presents to the ED for concern of urinary incontinency, this involves an extensive number of treatment options, and is a complaint that carries with it a high risk of complications and morbidity.  The differential diagnosis includes spina equina, UTI, bladder paralysis    Additional history obtained:  Additional history obtained from family at bedside External records from  outside source obtained and reviewed including neurology notes   Co morbidities that complicate the patient evaluation  Kidney stones  Social Determinants of Health:  N/A    Lab Tests:  I Ordered, and personally interpreted labs.  The pertinent results include: CBC shows leukocytosis of 17.5, BMP shows slight hyperglycemia 132, BUN 24, creatinine 1.0, GFR 53 UA shows leukocytes, red blood cells white blood cells no bacteria   Imaging Studies ordered:  I ordered imaging studies including bladder scan was 0 I independently visualized and interpreted imaging which showed N/A I agree with the radiologist interpretation   Cardiac Monitoring:  The patient was maintained on a cardiac monitor.  I personally viewed and interpreted the cardiac monitored which showed an underlying rhythm of: N/A   Medicines ordered and prescription drug management:  I ordered medication including N/A I have reviewed the patients home medicines and have made adjustments as needed  Critical Interventions:  N/A   Reevaluation:  With complaints of urinary frequency, she had benign exam, will obtain lab work bladder scan reassess  Lab work is reassuring, there is no evidence of infection seen on UA, bladder scan is negative for retention, suspect this is likely bladder spasms, will consult with urology for further recommendations  Updated patient recommendation from urology, patient went and removed stat catheter without difficulty, urine incontinency has stopped, she has no other  complaints at this time, and she is agreement plan discharge.  Consultations Obtained:  I requested consultation with the Dr. Abner Greenspan of urology,  and discussed lab and imaging findings as well as pertinent plan - they recommend: He notes that patient has a temporary stent with a string on it, these are typically removed 72 hours after surgery, he states that likely the stent had migrated causing her to be incontinent, he recommends she removes and if incontinency stops can be discharged home   Test Considered:  N/A    Rule out I have low suspicion for UTI, pyelo-, kidney stone she has no flank tenderness, UA is unremarkable for signs of hematuria or infection.  I have low suspicion for spinal equina no red flag symptoms no back tenderness no saddle paresthesias.  She is endorsing urinary incontinency likely secondary to bladder spasms.  It is known that patient has leukocytosis of 17.5, I doubt this is infectious, likely more stress related as she recently had surgery yesterday, she is afebrile nontachycardic, no obvious source infection on my exam.     Dispostion and problem list  After consideration of the diagnostic results and the patients response to treatment, I feel that the patent would benefit from discharge.  Urinary incontinency-since resolved, likely from migrated stent, stent was removed without difficulty, will have her follow-up with urology for further evaluation and strict return precautions.            Final Clinical Impression(s) / ED Diagnoses Final diagnoses:  Urinary incontinence, unspecified type    Rx / DC Orders ED Discharge Orders     None         Aron Baba 03/04/22 Skip Estimable, MD 03/05/22 [phone]

## 2022-03-04 NOTE — ED Notes (Signed)
Bladder scan x 3 result 49mL each time. Provider notified.

## 2022-03-04 NOTE — Discharge Instructions (Signed)
Your urine looked unremarkable, please continue with all home medications.  Please follow-up with Dr. Ronne Binning for further evaluation  Come back to the emergency department if you develop chest pain, shortness of breath, severe abdominal pain, uncontrolled nausea, vomiting, diarrhea.

## 2022-03-08 ENCOUNTER — Encounter (HOSPITAL_COMMUNITY): Payer: Self-pay | Admitting: Urology

## 2022-03-09 ENCOUNTER — Ambulatory Visit: Payer: PPO | Admitting: Physician Assistant

## 2022-03-10 ENCOUNTER — Ambulatory Visit (INDEPENDENT_AMBULATORY_CARE_PROVIDER_SITE_OTHER): Payer: PPO | Admitting: Physician Assistant

## 2022-03-10 VITALS — BP 119/72 | HR 97

## 2022-03-10 DIAGNOSIS — N200 Calculus of kidney: Secondary | ICD-10-CM | POA: Diagnosis not present

## 2022-03-10 DIAGNOSIS — Z87442 Personal history of urinary calculi: Secondary | ICD-10-CM

## 2022-03-10 NOTE — Progress Notes (Signed)
Assessment: 1. Renal calculi - Urinalysis, Routine w reflex microscopic    Plan: Stone prevention again discussed. Maintain adequate hydration. FU in 6 weeks after Renal US. Call or return if pt has concerns prior to next OV.  Chief Complaint: No chief complaint on file.   HPI: Patricia [name] is a 71 y.o. female with history of nephrolithiasis and urosepsis who presents for post op evaluation of stone manipulation with basket extraction and placement of right-sided double-J stent with removal of left-sided stent performed on 03/03/2022.  She presented to the emergency department on 03/04/2022 with complaint of sudden onset urinary incontinence without urgency, dysuria, hematuria, pain, weakness or neurologic symptoms of sensation change.  Call urologist, Dr. Raliegh Ip, recommended removal of remaining stent via tether which was performed.  Urinalysis indicated microscopic hematuria with 6-10 WBCs, but no bacteria.  Patient states symptoms resolved quickly thereafter and she has had no issues since.   UA=clear  Portions of the above documentation were copied from a prior visit for review purposes only.  Allergies: Allergies  Allergen Reactions   Amlodipine Swelling   Keflex [Cephalexin] Rash    PMH: Past Medical History:  Diagnosis Date   Arthritis    Diabetes mellitus without complication (HCC)    Hypertension    Hypothyroidism    PONV (postoperative nausea and vomiting)     PSH: Past Surgical History:  Procedure Laterality Date   ABDOMINAL HYSTERECTOMY     CHOLECYSTECTOMY N/A 03/02/2015   Procedure: LAPAROSCOPIC CHOLECYSTECTOMY;  Surgeon: Aviva Signs, MD;  Location: [facility];  Service: General;  Laterality: N/A;   CYSTOSCOPY W/ URETERAL STENT PLACEMENT Bilateral 02/02/2022   Procedure: CYSTOSCOPY WITH RETROGRADE PYELOGRAM;  Surgeon: Cleon Gustin, MD;  Location: [facility];  Service: Urology;  Laterality: Bilateral;   CYSTOSCOPY WITH RETROGRADE PYELOGRAM, URETEROSCOPY AND  STENT PLACEMENT Bilateral 03/03/2022   Procedure: CYSTOSCOPY WITH RETROGRADE PYELOGRAM, URETEROSCOPY AND STENT PLACEMENT;  Surgeon: Cleon Gustin, MD;  Location: [facility];  Service: Urology;  Laterality: Bilateral;   HOLMIUM LASER APPLICATION Right 10/93/2355   Procedure: HOLMIUM LASER APPLICATION;  Surgeon: Franchot Gallo, MD;  Location: [facility];  Service: Urology;  Laterality: Right;   IR URETERAL STENT RIGHT NEW ACCESS W/O SEP NEPHROSTOMY CATH  05/02/2019   KNEE ARTHROSCOPY Right    LIVER BIOPSY  03/02/2015   Procedure: LAPAROSCOPIC LIVER BIOPSY;  Surgeon: Aviva Signs, MD;  Location: [facility];  Service: General;;   NEPHROLITHOTOMY Right 05/02/2019   Procedure: NEPHROLITHOTOMY PERCUTANEOUS;  Surgeon: Franchot Gallo, MD;  Location: [facility];  Service: Urology;  Laterality: Right;  3 HRS   THYROIDECTOMY     TONSILLECTOMY  1970   TUBAL LIGATION      SH: Social History   Tobacco Use   Smoking status: Never   Smokeless tobacco: Never  Vaping Use   Vaping Use: Never used  Substance Use Topics   Alcohol use: No   Drug use: No    ROS: All other review of systems were reviewed and are negative except what is noted above in HPI  PE: BP 119/72   Pulse 97  GENERAL APPEARANCE:  Well appearing, well developed, NAD HEENT:  Atraumatic, normocephalic NECK:  Supple. Trachea midline ABDOMEN:  Soft, non-tender, no masses, no CVAT EXTREMITIES:  Moves all extremities well NEUROLOGIC:  Alert and oriented x 3 MENTAL STATUS:  appropriate SKIN:  Warm, dry, and intact   Results: Laboratory Data: Lab Results  Component Value Date   WBC 17.5 (H) 03/04/2022  HGB 11.5 (L) 03/04/2022   HCT 35.2 (L) 03/04/2022   MCV 90.7 03/04/2022   PLT 312 03/04/2022    Lab Results  Component Value Date   CREATININE 1.03 (H) 03/04/2022     Lab Results  Component Value Date   HGBA1C 6.9 (H) 04/26/2019    Urinalysis    Component Value Date/Time   COLORURINE STRAW (A) 03/04/2022 1441    APPEARANCEUR CLEAR 03/04/2022 1441   APPEARANCEUR Clear 02/11/2022 1018   LABSPEC 1.013 03/04/2022 1441   PHURINE 5.0 03/04/2022 1441   GLUCOSEU NEGATIVE 03/04/2022 1441   HGBUR SMALL (A) 03/04/2022 1441   BILIRUBINUR NEGATIVE 03/04/2022 1441   BILIRUBINUR Negative 02/11/2022 1018   KETONESUR NEGATIVE 03/04/2022 1441   PROTEINUR 100 (A) 03/04/2022 1441   UROBILINOGEN 0.2 08/06/2019 1043   UROBILINOGEN 0.2 08/12/2009 1550   NITRITE NEGATIVE 03/04/2022 1441   LEUKOCYTESUR SMALL (A) 03/04/2022 1441    Lab Results  Component Value Date   LABMICR See below: 02/11/2022   WBCUA 6-10 (A) 02/11/2022   LABEPIT 0-10 02/11/2022   MUCUS Present 02/11/2022   BACTERIA NONE SEEN 03/04/2022    Pertinent Imaging: Results for orders placed during the hospital encounter of 02/01/22  DG Abd 1 View  Narrative CLINICAL DATA:  Nephrolithiasis.  EXAM: ABDOMEN - 1 VIEW  COMPARISON:  Radiograph of February 04, 2020. CT scan February 01, 2022.  FINDINGS: The bowel gas pattern is normal. No definite nephrolithiasis is noted. Phleboliths are noted in the pelvis. Large distal right ureteral calculus is noted as described on prior CT scan.  IMPRESSION: Large distal right ureteral calculus is noted as described on prior CT scan.   Electronically Signed By: Lupita Raider M.D. On: 02/02/2022 09:53  No results found for this or any previous visit.  No results found for this or any previous visit.  No results found for this or any previous visit.  Results for orders placed during the hospital encounter of 02/01/22  US RENAL  Narrative CLINICAL DATA:  RIGHT ureteral calculus with trace RIGHT hydronephrosis on CT, diabetes mellitus, hypertension  EXAM: RENAL / URINARY TRACT ULTRASOUND COMPLETE  COMPARISON:  CT abdomen and pelvis 02/01/2022  FINDINGS: Right Kidney:  Renal measurements: 13.2 x 6.6 x 6.3 cm = volume: 287 mL. Normal cortical echogenicity. Diffuse cortical thinning. No  mass or hydronephrosis. No shadowing calcification.  Left Kidney:  Renal measurements: 12.6 x 7.1 x 6.7 cm = volume: 317 mL. Normal cortical thickness and echogenicity. No mass, hydronephrosis or shadowing calcification.  Bladder:  Distended urinary bladder, volume 781 mL. Ureteral jets were not visualized during the interval of imaging. No gross bladder mass or wall thickening.  Other:  N/A  IMPRESSION: Distended urinary bladder.  No evidence of renal mass or hydronephrosis.   Electronically Signed By: Ulyses Southward M.D. On: 02/02/2022 08:45  No results found for this or any previous visit.  No results found for this or any previous visit.  Results for orders placed during the hospital encounter of 02/12/22  CT Renal Stone Study  Narrative CLINICAL DATA:  71 year old female with urinary retention and abdominal pain. Patient has bilateral urinary stents.  EXAM: CT ABDOMEN AND PELVIS WITHOUT CONTRAST  TECHNIQUE: Multidetector CT imaging of the abdomen and pelvis was performed following the standard protocol without IV contrast.  RADIATION DOSE REDUCTION: This exam was performed according to the departmental dose-optimization program which includes automated exposure control, adjustment of the mA and/or kV according to patient size and/or use  of iterative reconstruction technique.  COMPARISON:  02/01/2022 CT and prior studies  FINDINGS: Please note that parenchymal and vascular abnormalities may be missed as intravenous contrast was not administered.  Lower chest: No acute abnormality.  Hepatobiliary: No hepatic abnormalities are identified. The patient is status post cholecystectomy. There is no evidence of intrahepatic or extrahepatic biliary dilatation.  Pancreas: Unremarkable  Spleen: Unremarkable  Adrenals/Urinary Tract: Bilateral ureteral stents are now identified. The RIGHT ureteral stent tips lie in the RIGHT renal pelvis and bladder. The  LEFT ureteral stent tips lie in the renal pelvis/upper collecting system and within the bladder. There is mild RIGHT hydronephrosis, slightly increased since 02/01/2022.  An equivocal faint 4 mm calcification/calculus along the RIGHT ureteral stent in the distal ureter is noted (image 80: Series 2).  A LEFT renal cyst is again noted and no imaging follow-up is recommended.  The adrenal glands are unremarkable.  Stomach/Bowel: Stomach is within normal limits. Appendix appears normal. No evidence of bowel wall thickening, distention, or inflammatory changes. Colonic diverticulosis identified without evidence of acute diverticulitis.  Vascular/Lymphatic: Aortic atherosclerosis. No enlarged abdominal or pelvic lymph nodes.  Reproductive: Status post hysterectomy. No adnexal masses.  Other: No ascites, focal collection or pneumoperitoneum.  Musculoskeletal: No acute or suspicious bony abnormalities are noted. Mild degenerative changes in the lumbar spine again noted.  IMPRESSION: 1. Bilateral ureteral stents with mild RIGHT hydronephrosis, slightly increased since 02/01/2022. Equivocal faint 4 mm calcification/calculus along the RIGHT ureteral stent in the distal RIGHT ureter. 2. Aortic Atherosclerosis (ICD10-I70.0).   Electronically Signed By: Harmon Pier M.D. On: 02/12/2022 19:05  No results found for this or any previous visit (from the past 24 hour(s)).

## 2022-03-11 LAB — MICROSCOPIC EXAMINATION
Bacteria, UA: NONE SEEN
Epithelial Cells (non renal): NONE SEEN /hpf (ref 0–10)

## 2022-03-11 LAB — URINALYSIS, ROUTINE W REFLEX MICROSCOPIC
Bilirubin, UA: NEGATIVE
Glucose, UA: NEGATIVE
Ketones, UA: NEGATIVE
Leukocytes,UA: NEGATIVE
Nitrite, UA: NEGATIVE
RBC, UA: NEGATIVE
Specific Gravity, UA: >1.03 — ABNORMAL HIGH (ref 1.005–1.030)
Urobilinogen, Ur: 0.2 mg/dL (ref 0.2–1.0)
pH, UA: 5 (ref 5.0–7.5)

## 2022-03-11 LAB — CALCULI, WITH PHOTOGRAPH (CLINICAL LAB)
Calcium Oxalate Dihydrate: 10 %
Calcium Oxalate Monohydrate: 90 %
Weight Calculi: 73 mg

## 2022-03-25 DIAGNOSIS — G250 Essential tremor: Secondary | ICD-10-CM | POA: Diagnosis not present

## 2022-03-25 DIAGNOSIS — R413 Other amnesia: Secondary | ICD-10-CM | POA: Diagnosis not present

## 2022-04-08 DIAGNOSIS — G250 Essential tremor: Secondary | ICD-10-CM | POA: Diagnosis not present

## 2022-04-13 DIAGNOSIS — R413 Other amnesia: Secondary | ICD-10-CM | POA: Diagnosis not present

## 2022-04-13 DIAGNOSIS — G250 Essential tremor: Secondary | ICD-10-CM | POA: Diagnosis not present

## 2022-04-14 ENCOUNTER — Ambulatory Visit (HOSPITAL_COMMUNITY)
Admission: RE | Admit: 2022-04-14 | Discharge: 2022-04-14 | Disposition: A | Discharge status: Home / Self Care | Payer: PPO | Source: Ambulatory Visit | Attending: Physician Assistant | Admitting: Physician Assistant

## 2022-04-14 DIAGNOSIS — Z87442 Personal history of urinary calculi: Secondary | ICD-10-CM | POA: Diagnosis not present

## 2022-04-14 DIAGNOSIS — N200 Calculus of kidney: Secondary | ICD-10-CM | POA: Diagnosis not present

## 2022-04-14 DIAGNOSIS — N281 Cyst of kidney, acquired: Secondary | ICD-10-CM | POA: Diagnosis not present

## 2022-04-20 ENCOUNTER — Encounter: Payer: Self-pay | Admitting: Urology

## 2022-04-20 ENCOUNTER — Ambulatory Visit (INDEPENDENT_AMBULATORY_CARE_PROVIDER_SITE_OTHER): Payer: PPO | Admitting: Urology

## 2022-04-20 VITALS — BP 169/74 | HR 93

## 2022-04-20 DIAGNOSIS — Z87442 Personal history of urinary calculi: Secondary | ICD-10-CM

## 2022-04-20 DIAGNOSIS — N3281 Overactive bladder: Secondary | ICD-10-CM

## 2022-04-20 DIAGNOSIS — N200 Calculus of kidney: Secondary | ICD-10-CM

## 2022-04-20 DIAGNOSIS — R351 Nocturia: Secondary | ICD-10-CM | POA: Diagnosis not present

## 2022-04-20 LAB — URINALYSIS, ROUTINE W REFLEX MICROSCOPIC
Bilirubin, UA: NEGATIVE
Glucose, UA: NEGATIVE
Ketones, UA: NEGATIVE
Leukocytes,UA: NEGATIVE
Nitrite, UA: NEGATIVE
Protein,UA: NEGATIVE
RBC, UA: NEGATIVE
Specific Gravity, UA: 1.02 (ref 1.005–1.030)
Urobilinogen, Ur: 0.2 mg/dL (ref 0.2–1.0)
pH, UA: 7 (ref 5.0–7.5)

## 2022-04-20 MED ORDER — MIRABEGRON ER 25 MG PO TB24: 25.0000 mg | ORAL_TABLET | Freq: Every day | ORAL | 0 refills | Status: DC

## 2022-04-20 NOTE — Progress Notes (Signed)
04/20/2022 10:07 AM   Patricia [name] [birth year] [id number]  Referring provider: Assunta Found, MD [address] [place],  [place] [address]  Followup nephrolithiasis   HPI: Patricia [name] is a 71yo here for followup for nephrolithiasis. Renal US 10/26 showed no calculi and no hydronephrosis. She has urinary urgency and urge incontinence which is bothersome to her. She has urinary frequency every 2-3 hours. Nocturia 3-4x.    PMH: Past Medical History:  Diagnosis Date   Arthritis    Diabetes mellitus without complication (HCC)    Hypertension    Hypothyroidism    PONV (postoperative nausea and vomiting)     Surgical History: Past Surgical History:  Procedure Laterality Date   ABDOMINAL HYSTERECTOMY     CHOLECYSTECTOMY N/A 03/02/2015   Procedure: LAPAROSCOPIC CHOLECYSTECTOMY;  Surgeon: Franky Macho, MD;  Location: [facility];  Service: General;  Laterality: N/A;   CYSTOSCOPY W/ URETERAL STENT PLACEMENT Bilateral 02/02/2022   Procedure: CYSTOSCOPY WITH RETROGRADE PYELOGRAM;  Surgeon: Malen Gauze, MD;  Location: [facility];  Service: Urology;  Laterality: Bilateral;   CYSTOSCOPY WITH RETROGRADE PYELOGRAM, URETEROSCOPY AND STENT PLACEMENT Bilateral 03/03/2022   Procedure: CYSTOSCOPY WITH RETROGRADE PYELOGRAM, URETEROSCOPY AND STENT PLACEMENT;  Surgeon: Malen Gauze, MD;  Location: [facility];  Service: Urology;  Laterality: Bilateral;   HOLMIUM LASER APPLICATION Right 05/02/2019   Procedure: HOLMIUM LASER APPLICATION;  Surgeon: Marcine Matar, MD;  Location: [facility];  Service: Urology;  Laterality: Right;   IR URETERAL STENT RIGHT NEW ACCESS W/O SEP NEPHROSTOMY CATH  05/02/2019   KNEE ARTHROSCOPY Right    LIVER BIOPSY  03/02/2015   Procedure: LAPAROSCOPIC LIVER BIOPSY;  Surgeon: Franky Macho, MD;  Location: [facility];  Service: General;;   NEPHROLITHOTOMY Right 05/02/2019   Procedure: NEPHROLITHOTOMY PERCUTANEOUS;  Surgeon: Marcine Matar, MD;  Location: [facility];  Service:  Urology;  Laterality: Right;  3 HRS   THYROIDECTOMY     TONSILLECTOMY  1970   TUBAL LIGATION      Home Medications:  Allergies as of 04/20/2022       Reactions   Amlodipine Swelling   Keflex [cephalexin] Rash        Medication List        Accurate as of April 20, 2022 10:07 AM. If you have any questions, ask your nurse or doctor.          APPLE CIDER VINEGAR PO Take 1 tablet by mouth daily.   cetirizine 10 MG tablet Commonly known as: ZYRTEC Take 10 mg by mouth daily.   CINNAMON PO Take 1,000 mg by mouth daily.   escitalopram 5 MG tablet Commonly known as: LEXAPRO Take 5 mg by mouth daily.   Fish Oil 1000 MG Caps Take 1 capsule by mouth daily.   HYDROcodone-acetaminophen 5-325 MG tablet Commonly known as: Norco Take 1 tablet by mouth every 6 (six) hours as needed for moderate pain.   levothyroxine 175 MCG tablet Commonly known as: SYNTHROID Take 175 mcg by mouth daily.   Melatonin 10 MG Caps Take 10 mg by mouth at bedtime as needed (sleep).   meloxicam 15 MG tablet Commonly known as: MOBIC Take 15 mg by mouth as needed for pain.   metFORMIN 500 MG tablet Commonly known as: GLUCOPHAGE Take 500 mg by mouth 2 (two) times daily.   ondansetron 4 MG tablet Commonly known as: ZOFRAN Take 1 tablet (4 mg total) by mouth every 6 (six) hours.   pregabalin 200 MG capsule Commonly known as: LYRICA Take  200 mg by mouth 2 (two) times daily.   simvastatin 20 MG tablet Commonly known as: ZOCOR Take 20 mg by mouth daily.   Tiadylt ER 360 MG 24 hr capsule Generic drug: diltiazem Take 360 mg by mouth daily.        Allergies:  Allergies  Allergen Reactions   Amlodipine Swelling   Keflex [Cephalexin] Rash    Family History: No family history on file.  Social History:  reports that she has never smoked. She has never used smokeless tobacco. She reports that she does not drink alcohol and does not use drugs.  ROS: All other review of systems  were reviewed and are negative except what is noted above in HPI  Physical Exam: BP (!) 169/74   Pulse 93   Constitutional:  Alert and oriented, No acute distress. HEENT: [place] AT, moist mucus membranes.  Trachea midline, no masses. Cardiovascular: No clubbing, cyanosis, or edema. Respiratory: Normal respiratory effort, no increased work of breathing. GI: Abdomen is soft, nontender, nondistended, no abdominal masses GU: No CVA tenderness.  Lymph: No cervical or inguinal lymphadenopathy. Skin: No rashes, bruises or suspicious lesions. Neurologic: Grossly intact, no focal deficits, moving all 4 extremities. Psychiatric: Normal mood and affect.  Laboratory Data: Lab Results  Component Value Date   WBC 17.5 (H) 03/04/2022   HGB 11.5 (L) 03/04/2022   HCT 35.2 (L) 03/04/2022   MCV 90.7 03/04/2022   PLT 312 03/04/2022    Lab Results  Component Value Date   CREATININE 1.03 (H) 03/04/2022    No results found for: "PSA"  No results found for: "TESTOSTERONE"  Lab Results  Component Value Date   HGBA1C 6.9 (H) 04/26/2019    Urinalysis    Component Value Date/Time   COLORURINE STRAW (A) 03/04/2022 1441   APPEARANCEUR Cloudy (A) 03/10/2022 1540   LABSPEC 1.013 03/04/2022 1441   PHURINE 5.0 03/04/2022 1441   GLUCOSEU Negative 03/10/2022 1540   HGBUR SMALL (A) 03/04/2022 1441   BILIRUBINUR Negative 03/10/2022 1540   KETONESUR NEGATIVE 03/04/2022 1441   PROTEINUR 2+ (A) 03/10/2022 1540   PROTEINUR 100 (A) 03/04/2022 1441   UROBILINOGEN 0.2 08/06/2019 1043   UROBILINOGEN 0.2 08/12/2009 1550   NITRITE Negative 03/10/2022 1540   NITRITE NEGATIVE 03/04/2022 1441   LEUKOCYTESUR Negative 03/10/2022 1540   LEUKOCYTESUR SMALL (A) 03/04/2022 1441    Lab Results  Component Value Date   LABMICR See below: 03/10/2022   WBCUA 0-5 03/10/2022   LABEPIT None seen 03/10/2022   MUCUS Present 02/11/2022   BACTERIA None seen 03/10/2022    Pertinent Imaging: Renal US 04/14/2022: Images  reviewed and discussed with the patient  Results for orders placed during the hospital encounter of 02/01/22  DG Abd 1 View  Narrative CLINICAL DATA:  Nephrolithiasis.  EXAM: ABDOMEN - 1 VIEW  COMPARISON:  Radiograph of February 04, 2020. CT scan February 01, 2022.  FINDINGS: The bowel gas pattern is normal. No definite nephrolithiasis is noted. Phleboliths are noted in the pelvis. Large distal right ureteral calculus is noted as described on prior CT scan.  IMPRESSION: Large distal right ureteral calculus is noted as described on prior CT scan.   Electronically Signed By: Marijo Conception M.D. On: 02/02/2022 09:53  No results found for this or any previous visit.  No results found for this or any previous visit.  No results found for this or any previous visit.  Results for orders placed during the hospital encounter of 04/14/22  US RENAL  Narrative CLINICAL DATA:  Nephrolithiasis, prior stenting, follow-up.  EXAM: RENAL / URINARY TRACT ULTRASOUND COMPLETE  COMPARISON:  CT abdomen and pelvis 02/12/2022  FINDINGS: Right Kidney:  Renal measurements: 11.5 x 5.2 x 5.2 cm = volume: 164 mL. Insert being  Left Kidney:  Renal measurements: 10.6 x 7.1 x 5.5 cm = volume: 214 ML. Lobulated renal contour. Normal cortical thickness and echogenicity. No hydronephrosis or shadowing calcification. No definite renal mass visualized; medial LEFT renal cyst seen on prior CT exam is obscured by shadowing and body habitus.  Bladder:  Appears normal for degree of bladder distention. Calculated prevoid volume 419 mL. No postvoid residual volume.  Other:  N/A  IMPRESSION: No renal sonographic abnormalities identified.   Electronically Signed By: Ulyses Southward M.D. On: 04/14/2022 19:17  No valid procedures specified. No results found for this or any previous visit.  Results for orders placed during the hospital encounter of 02/12/22  CT Renal Stone  Study  Narrative CLINICAL DATA:  71 year old female with urinary retention and abdominal pain. Patient has bilateral urinary stents.  EXAM: CT ABDOMEN AND PELVIS WITHOUT CONTRAST  TECHNIQUE: Multidetector CT imaging of the abdomen and pelvis was performed following the standard protocol without IV contrast.  RADIATION DOSE REDUCTION: This exam was performed according to the departmental dose-optimization program which includes automated exposure control, adjustment of the mA and/or kV according to patient size and/or use of iterative reconstruction technique.  COMPARISON:  02/01/2022 CT and prior studies  FINDINGS: Please note that parenchymal and vascular abnormalities may be missed as intravenous contrast was not administered.  Lower chest: No acute abnormality.  Hepatobiliary: No hepatic abnormalities are identified. The patient is status post cholecystectomy. There is no evidence of intrahepatic or extrahepatic biliary dilatation.  Pancreas: Unremarkable  Spleen: Unremarkable  Adrenals/Urinary Tract: Bilateral ureteral stents are now identified. The RIGHT ureteral stent tips lie in the RIGHT renal pelvis and bladder. The LEFT ureteral stent tips lie in the renal pelvis/upper collecting system and within the bladder. There is mild RIGHT hydronephrosis, slightly increased since 02/01/2022.  An equivocal faint 4 mm calcification/calculus along the RIGHT ureteral stent in the distal ureter is noted (image 80: Series 2).  A LEFT renal cyst is again noted and no imaging follow-up is recommended.  The adrenal glands are unremarkable.  Stomach/Bowel: Stomach is within normal limits. Appendix appears normal. No evidence of bowel wall thickening, distention, or inflammatory changes. Colonic diverticulosis identified without evidence of acute diverticulitis.  Vascular/Lymphatic: Aortic atherosclerosis. No enlarged abdominal or pelvic lymph nodes.  Reproductive: Status  post hysterectomy. No adnexal masses.  Other: No ascites, focal collection or pneumoperitoneum.  Musculoskeletal: No acute or suspicious bony abnormalities are noted. Mild degenerative changes in the lumbar spine again noted.  IMPRESSION: 1. Bilateral ureteral stents with mild RIGHT hydronephrosis, slightly increased since 02/01/2022. Equivocal faint 4 mm calcification/calculus along the RIGHT ureteral stent in the distal RIGHT ureter. 2. Aortic Atherosclerosis (ICD10-I70.0).   Electronically Signed By: Harmon Pier M.D. On: 02/12/2022 19:05   Assessment & Plan:    1. Renal calculi -RTC 6 months with renal US - Urinalysis, Routine w reflex microscopic  2. Overactive bladder -We will trial mirabegron 25mg  daily   No follow-ups on file.  , MD  [facility] [place]

## 2022-04-20 NOTE — Patient Instructions (Signed)

## 2022-04-29 DIAGNOSIS — M5136 Other intervertebral disc degeneration, lumbar region: Secondary | ICD-10-CM | POA: Diagnosis not present

## 2022-04-29 DIAGNOSIS — E6609 Other obesity due to excess calories: Secondary | ICD-10-CM | POA: Diagnosis not present

## 2022-04-29 DIAGNOSIS — E1140 Type 2 diabetes mellitus with diabetic neuropathy, unspecified: Secondary | ICD-10-CM | POA: Diagnosis not present

## 2022-04-29 DIAGNOSIS — R251 Tremor, unspecified: Secondary | ICD-10-CM | POA: Diagnosis not present

## 2022-04-29 DIAGNOSIS — Z6830 Body mass index (BMI) 30.0-30.9, adult: Secondary | ICD-10-CM | POA: Diagnosis not present

## 2022-04-29 DIAGNOSIS — S99921D Unspecified injury of right foot, subsequent encounter: Secondary | ICD-10-CM | POA: Diagnosis not present

## 2022-04-29 DIAGNOSIS — G2581 Restless legs syndrome: Secondary | ICD-10-CM | POA: Diagnosis not present

## 2022-05-09 ENCOUNTER — Encounter (INDEPENDENT_AMBULATORY_CARE_PROVIDER_SITE_OTHER): Payer: Self-pay | Admitting: Gastroenterology

## 2022-05-09 DIAGNOSIS — E7849 Other hyperlipidemia: Secondary | ICD-10-CM | POA: Diagnosis not present

## 2022-05-09 DIAGNOSIS — E1140 Type 2 diabetes mellitus with diabetic neuropathy, unspecified: Secondary | ICD-10-CM | POA: Diagnosis not present

## 2022-05-09 DIAGNOSIS — E875 Hyperkalemia: Secondary | ICD-10-CM | POA: Diagnosis not present

## 2022-05-09 DIAGNOSIS — E6609 Other obesity due to excess calories: Secondary | ICD-10-CM | POA: Diagnosis not present

## 2022-05-09 DIAGNOSIS — E782 Mixed hyperlipidemia: Secondary | ICD-10-CM | POA: Diagnosis not present

## 2022-05-09 DIAGNOSIS — E119 Type 2 diabetes mellitus without complications: Secondary | ICD-10-CM | POA: Diagnosis not present

## 2022-05-09 DIAGNOSIS — E890 Postprocedural hypothyroidism: Secondary | ICD-10-CM | POA: Diagnosis not present

## 2022-05-09 DIAGNOSIS — Z6830 Body mass index (BMI) 30.0-30.9, adult: Secondary | ICD-10-CM | POA: Diagnosis not present

## 2022-05-09 DIAGNOSIS — I10 Essential (primary) hypertension: Secondary | ICD-10-CM | POA: Diagnosis not present

## 2022-05-30 DIAGNOSIS — H9209 Otalgia, unspecified ear: Secondary | ICD-10-CM | POA: Diagnosis not present

## 2022-05-30 DIAGNOSIS — E6609 Other obesity due to excess calories: Secondary | ICD-10-CM | POA: Diagnosis not present

## 2022-05-30 DIAGNOSIS — Z6831 Body mass index (BMI) 31.0-31.9, adult: Secondary | ICD-10-CM | POA: Diagnosis not present

## 2022-05-30 DIAGNOSIS — J069 Acute upper respiratory infection, unspecified: Secondary | ICD-10-CM | POA: Diagnosis not present

## 2022-06-08 DIAGNOSIS — Z1212 Encounter for screening for malignant neoplasm of rectum: Secondary | ICD-10-CM | POA: Diagnosis not present

## 2022-07-22 DIAGNOSIS — T461X5A Adverse effect of calcium-channel blockers, initial encounter: Secondary | ICD-10-CM | POA: Diagnosis not present

## 2022-07-22 DIAGNOSIS — E6609 Other obesity due to excess calories: Secondary | ICD-10-CM | POA: Diagnosis not present

## 2022-07-22 DIAGNOSIS — R0681 Apnea, not elsewhere classified: Secondary | ICD-10-CM | POA: Diagnosis not present

## 2022-07-22 DIAGNOSIS — R0683 Snoring: Secondary | ICD-10-CM | POA: Diagnosis not present

## 2022-07-22 DIAGNOSIS — R5383 Other fatigue: Secondary | ICD-10-CM | POA: Diagnosis not present

## 2022-07-22 DIAGNOSIS — Z6832 Body mass index (BMI) 32.0-32.9, adult: Secondary | ICD-10-CM | POA: Diagnosis not present

## 2022-07-22 DIAGNOSIS — R251 Tremor, unspecified: Secondary | ICD-10-CM | POA: Diagnosis not present

## 2022-08-22 ENCOUNTER — Ambulatory Visit (HOSPITAL_COMMUNITY)
Admission: RE | Admit: 2022-08-22 | Discharge: 2022-08-22 | Disposition: A | Discharge status: Home / Self Care | Payer: PPO | Source: Ambulatory Visit | Attending: Family Medicine | Admitting: Family Medicine

## 2022-08-22 ENCOUNTER — Other Ambulatory Visit (HOSPITAL_COMMUNITY): Payer: Self-pay | Admitting: Family Medicine

## 2022-08-22 DIAGNOSIS — J302 Other seasonal allergic rhinitis: Secondary | ICD-10-CM | POA: Diagnosis not present

## 2022-08-22 DIAGNOSIS — J45909 Unspecified asthma, uncomplicated: Secondary | ICD-10-CM | POA: Insufficient documentation

## 2022-08-22 DIAGNOSIS — J9801 Acute bronchospasm: Secondary | ICD-10-CM | POA: Insufficient documentation

## 2022-08-22 DIAGNOSIS — E6609 Other obesity due to excess calories: Secondary | ICD-10-CM | POA: Diagnosis not present

## 2022-08-22 DIAGNOSIS — J479 Bronchiectasis, uncomplicated: Secondary | ICD-10-CM | POA: Diagnosis not present

## 2022-08-22 DIAGNOSIS — Z6832 Body mass index (BMI) 32.0-32.9, adult: Secondary | ICD-10-CM | POA: Diagnosis not present

## 2022-09-14 ENCOUNTER — Ambulatory Visit (INDEPENDENT_AMBULATORY_CARE_PROVIDER_SITE_OTHER): Payer: PPO | Admitting: Urology

## 2022-09-14 VITALS — BP 172/74 | HR 106

## 2022-09-14 DIAGNOSIS — N3281 Overactive bladder: Secondary | ICD-10-CM

## 2022-09-14 DIAGNOSIS — N200 Calculus of kidney: Secondary | ICD-10-CM

## 2022-09-14 LAB — URINALYSIS, ROUTINE W REFLEX MICROSCOPIC
Bilirubin, UA: NEGATIVE
Leukocytes,UA: NEGATIVE
Nitrite, UA: NEGATIVE
Protein,UA: NEGATIVE
RBC, UA: NEGATIVE
Specific Gravity, UA: 1.02 (ref 1.005–1.030)
Urobilinogen, Ur: 0.2 mg/dL (ref 0.2–1.0)
pH, UA: 5.5 (ref 5.0–7.5)

## 2022-09-14 MED ORDER — CYCLOBENZAPRINE HCL 5 MG PO TABS: 5.0000 mg | ORAL_TABLET | Freq: Three times a day (TID) | ORAL | 0 refills | Status: AC | PRN

## 2022-09-14 NOTE — Progress Notes (Unsigned)
09/14/2022 1:38 PM   Patricia [name] 72-Apr-1952 [id number]  Referring provider: Sharilyn Sites, [address] [place] [place],  [place] [id number]  No chief complaint on file.   HPI:    PMH: Past Medical History:  Diagnosis Date   Arthritis    Diabetes mellitus without complication (HCC)    Hypertension    Hypothyroidism    PONV (postoperative nausea and vomiting)     Surgical History: Past Surgical History:  Procedure Laterality Date   ABDOMINAL HYSTERECTOMY     CHOLECYSTECTOMY N/A 03/02/2015   Procedure: LAPAROSCOPIC CHOLECYSTECTOMY;  Surgeon: Aviva Signs, MD;  Location: [facility];  Service: General;  Laterality: N/A;   CYSTOSCOPY W/ URETERAL STENT PLACEMENT Bilateral 02/02/2022   Procedure: CYSTOSCOPY WITH RETROGRADE PYELOGRAM;  Surgeon: Cleon Gustin, MD;  Location: [facility];  Service: Urology;  Laterality: Bilateral;   CYSTOSCOPY WITH RETROGRADE PYELOGRAM, URETEROSCOPY AND STENT PLACEMENT Bilateral 03/03/2022   Procedure: CYSTOSCOPY WITH RETROGRADE PYELOGRAM, URETEROSCOPY AND STENT PLACEMENT;  Surgeon: Cleon Gustin, MD;  Location: [facility];  Service: Urology;  Laterality: Bilateral;   HOLMIUM LASER APPLICATION Right [id number]   Procedure: HOLMIUM LASER APPLICATION;  Surgeon: Franchot Gallo, MD;  Location: [facility];  Service: Urology;  Laterality: Right;   IR URETERAL STENT RIGHT NEW ACCESS W/O SEP NEPHROSTOMY CATH  05/02/2019   KNEE ARTHROSCOPY Right    LIVER BIOPSY  03/02/2015   Procedure: LAPAROSCOPIC LIVER BIOPSY;  Surgeon: Aviva Signs, MD;  Location: [facility];  Service: General;;   NEPHROLITHOTOMY Right 05/02/2019   Procedure: NEPHROLITHOTOMY PERCUTANEOUS;  Surgeon: Franchot Gallo, MD;  Location: [facility];  Service: Urology;  Laterality: Right;  3 HRS   THYROIDECTOMY     TONSILLECTOMY  1970   TUBAL LIGATION      Home Medications:  Allergies as of 09/14/2022       Reactions   Amlodipine Swelling   Keflex [cephalexin] Rash        Medication List         Accurate as of September 14, 2022  1:38 PM. If you have any questions, ask your nurse or doctor.          APPLE CIDER VINEGAR PO Take 1 tablet by mouth daily.   cetirizine 10 MG tablet Commonly known as: ZYRTEC Take 10 mg by mouth daily.   CINNAMON PO Take 1,000 mg by mouth daily.   cyclobenzaprine 5 MG tablet Commonly known as: FLEXERIL Take 1 tablet (5 mg total) by mouth 3 (three) times daily as needed for muscle spasms.   escitalopram 5 MG tablet Commonly known as: LEXAPRO Take 5 mg by mouth daily.   Fish Oil 1000 MG Caps Take 1 capsule by mouth daily.   HYDROcodone-acetaminophen 5-325 MG tablet Commonly known as: Norco Take 1 tablet by mouth every 6 (six) hours as needed for moderate pain.   levothyroxine 175 MCG tablet Commonly known as: SYNTHROID Take 175 mcg by mouth daily.   Melatonin 10 MG Caps Take 10 mg by mouth at bedtime as needed (sleep).   meloxicam 15 MG tablet Commonly known as: MOBIC Take 15 mg by mouth as needed for pain.   metFORMIN 500 MG tablet Commonly known as: GLUCOPHAGE Take 500 mg by mouth 2 (two) times daily.   mirabegron ER 25 MG Tb24 tablet Commonly known as: MYRBETRIQ Take 1 tablet (25 mg total) by mouth daily.   ondansetron 4 MG tablet Commonly known as: ZOFRAN Take 1 tablet (4 mg total) by mouth every 6 (six)  hours.   pregabalin 200 MG capsule Commonly known as: LYRICA Take 200 mg by mouth 2 (two) times daily.   simvastatin 20 MG tablet Commonly known as: ZOCOR Take 20 mg by mouth daily.   Tiadylt ER 360 MG 24 hr capsule Generic drug: diltiazem Take 360 mg by mouth daily.        Allergies:  Allergies  Allergen Reactions   Amlodipine Swelling   Keflex [Cephalexin] Rash    Family History: No family history on file.  Social History:  reports that she has never smoked. She has never used smokeless tobacco. She reports that she does not drink alcohol and does not use drugs.  ROS: All other review of  systems were reviewed and are negative except what is noted above in HPI  Physical Exam: BP (!) 172/74   Pulse (!) 106   Constitutional:  Alert and oriented, No acute distress. HEENT: [place] AT, moist mucus membranes.  Trachea midline, no masses. Cardiovascular: No clubbing, cyanosis, or edema. Respiratory: Normal respiratory effort, no increased work of breathing. GI: Abdomen is soft, nontender, nondistended, no abdominal masses GU: No CVA tenderness.  Lymph: No cervical or inguinal lymphadenopathy. Skin: No rashes, bruises or suspicious lesions. Neurologic: Grossly intact, no focal deficits, moving all 4 extremities. Psychiatric: Normal mood and affect.  Laboratory Data: Lab Results  Component Value Date   WBC 17.5 (H) 03/04/2022   HGB 11.5 (L) 03/04/2022   HCT 35.2 (L) 03/04/2022   MCV 90.7 03/04/2022   PLT 312 03/04/2022    Lab Results  Component Value Date   CREATININE 1.03 (H) 03/04/2022    No results found for: "PSA"  No results found for: "TESTOSTERONE"  Lab Results  Component Value Date   HGBA1C 6.9 (H) 04/26/2019    Urinalysis    Component Value Date/Time   COLORURINE STRAW (A) 03/04/2022 1441   APPEARANCEUR Clear 04/20/2022 0957   LABSPEC 1.013 03/04/2022 1441   PHURINE 5.0 03/04/2022 1441   GLUCOSEU Negative 04/20/2022 0957   HGBUR SMALL (A) 03/04/2022 1441   BILIRUBINUR Negative 04/20/2022 0957   KETONESUR NEGATIVE 03/04/2022 1441   PROTEINUR Negative 04/20/2022 0957   PROTEINUR 100 (A) 03/04/2022 1441   UROBILINOGEN 0.2 08/06/2019 1043   UROBILINOGEN 0.2 08/12/2009 1550   NITRITE Negative 04/20/2022 0957   NITRITE NEGATIVE 03/04/2022 1441   LEUKOCYTESUR Negative 04/20/2022 0957   LEUKOCYTESUR SMALL (A) 03/04/2022 1441    Lab Results  Component Value Date   LABMICR Comment 04/20/2022   WBCUA 0-5 03/10/2022   LABEPIT None seen 03/10/2022   MUCUS Present 02/11/2022   BACTERIA None seen 03/10/2022    Pertinent Imaging: *** Results for  orders placed during the hospital encounter of 02/01/22  DG Abd 1 View  Narrative CLINICAL DATA:  Nephrolithiasis.  EXAM: ABDOMEN - 1 VIEW  COMPARISON:  Radiograph of February 04, 2020. CT scan February 01, 2022.  FINDINGS: The bowel gas pattern is normal. No definite nephrolithiasis is noted. Phleboliths are noted in the pelvis. Large distal right ureteral calculus is noted as described on prior CT scan.  IMPRESSION: Large distal right ureteral calculus is noted as described on prior CT scan.   Electronically Signed By: Marijo Conception M.D. On: 02/02/2022 09:53  No results found for this or any previous visit.  No results found for this or any previous visit.  No results found for this or any previous visit.  Results for orders placed during the hospital encounter of 04/14/22  US RENAL  Narrative CLINICAL DATA:  Nephrolithiasis, prior stenting, follow-up.  EXAM: RENAL / URINARY TRACT ULTRASOUND COMPLETE  COMPARISON:  CT abdomen and pelvis 02/12/2022  FINDINGS: Right Kidney:  Renal measurements: 11.5 x 5.2 x 5.2 cm = volume: 164 mL. Insert being  Left Kidney:  Renal measurements: 10.6 x 7.1 x 5.5 cm = volume: 214 ML. Lobulated renal contour. Normal cortical thickness and echogenicity. No hydronephrosis or shadowing calcification. No definite renal mass visualized; medial LEFT renal cyst seen on prior CT exam is obscured by shadowing and body habitus.  Bladder:  Appears normal for degree of bladder distention. Calculated prevoid volume 419 mL. No postvoid residual volume.  Other:  N/A  IMPRESSION: No renal sonographic abnormalities identified.   Electronically Signed By: Lavonia Dana M.D. On: 04/14/2022 19:17  No valid procedures specified. No results found for this or any previous visit.  Results for orders placed during the hospital encounter of 02/12/22  CT Renal Stone Study  Narrative CLINICAL DATA:  72 year old female with urinary  retention and abdominal pain. Patient has bilateral urinary stents.  EXAM: CT ABDOMEN AND PELVIS WITHOUT CONTRAST  TECHNIQUE: Multidetector CT imaging of the abdomen and pelvis was performed following the standard protocol without IV contrast.  RADIATION DOSE REDUCTION: This exam was performed according to the departmental dose-optimization program which includes automated exposure control, adjustment of the mA and/or kV according to patient size and/or use of iterative reconstruction technique.  COMPARISON:  02/01/2022 CT and prior studies  FINDINGS: Please note that parenchymal and vascular abnormalities may be missed as intravenous contrast was not administered.  Lower chest: No acute abnormality.  Hepatobiliary: No hepatic abnormalities are identified. The patient is status post cholecystectomy. There is no evidence of intrahepatic or extrahepatic biliary dilatation.  Pancreas: Unremarkable  Spleen: Unremarkable  Adrenals/Urinary Tract: Bilateral ureteral stents are now identified. The RIGHT ureteral stent tips lie in the RIGHT renal pelvis and bladder. The LEFT ureteral stent tips lie in the renal pelvis/upper collecting system and within the bladder. There is mild RIGHT hydronephrosis, slightly increased since 02/01/2022.  An equivocal faint 4 mm calcification/calculus along the RIGHT ureteral stent in the distal ureter is noted (image 80: Series 2).  A LEFT renal cyst is again noted and no imaging follow-up is recommended.  The adrenal glands are unremarkable.  Stomach/Bowel: Stomach is within normal limits. Appendix appears normal. No evidence of bowel wall thickening, distention, or inflammatory changes. Colonic diverticulosis identified without evidence of acute diverticulitis.  Vascular/Lymphatic: Aortic atherosclerosis. No enlarged abdominal or pelvic lymph nodes.  Reproductive: Status post hysterectomy. No adnexal masses.  Other: No ascites, focal  collection or pneumoperitoneum.  Musculoskeletal: No acute or suspicious bony abnormalities are noted. Mild degenerative changes in the lumbar spine again noted.  IMPRESSION: 1. Bilateral ureteral stents with mild RIGHT hydronephrosis, slightly increased since 02/01/2022. Equivocal faint 4 mm calcification/calculus along the RIGHT ureteral stent in the distal RIGHT ureter. 2. Aortic Atherosclerosis (ICD10-I70.0).   Electronically Signed By: Margarette Canada M.D. On: 02/12/2022 19:05   Assessment & Plan:    1. Renal calculi *** - Ultrasound renal complete  2. OAB: -increase mirabegron to 50mg    No follow-ups on file.  Nicolette Bang, MD  [facility] [place]

## 2022-09-15 ENCOUNTER — Encounter: Payer: Self-pay | Admitting: Urology

## 2022-09-15 NOTE — Patient Instructions (Signed)

## 2022-09-27 DIAGNOSIS — J9801 Acute bronchospasm: Secondary | ICD-10-CM | POA: Diagnosis not present

## 2022-09-27 DIAGNOSIS — E6609 Other obesity due to excess calories: Secondary | ICD-10-CM | POA: Diagnosis not present

## 2022-09-27 DIAGNOSIS — J45909 Unspecified asthma, uncomplicated: Secondary | ICD-10-CM | POA: Diagnosis not present

## 2022-09-27 DIAGNOSIS — J302 Other seasonal allergic rhinitis: Secondary | ICD-10-CM | POA: Diagnosis not present

## 2022-09-27 DIAGNOSIS — Z6832 Body mass index (BMI) 32.0-32.9, adult: Secondary | ICD-10-CM | POA: Diagnosis not present

## 2022-10-12 NOTE — Progress Notes (Signed)
Patricia [name], female    DOB: 1950/07/14   MRN: [id number]   Brief patient profile:  7  yowf  referred to pulmonary clinic 10/14/2022 by Dr Phillips Odor  for doe ? Etiology aburpt onset early March 2024 during her usual walk from [place] to car in usual parking space - has not recurred but does happen coming back from mailbox    Has h/o uti's and kidney stones on macrodantin multiple times but not on maint rx.   History of Present Illness  10/14/2022  Pulmonary/ 1st office eval/Alicja Everitt  Chief Complaint  Patient presents with   Consult    Pt consult for DOE, she states that reset makes her symptoms better. Never smoked - thyroid removed in 1994. Not using oxygen   Dyspnea:  mb and back to house and stops half way  Cough: noct cough x years on prn codeine and wheeze is new since the event [place] after big meal but hasn't happened since walking same route ( s the big meal)  Sleep: bed is flat R side down one pillow  SABA use: seems to help if uses albuterol but only did it once, maybe twice since she got it - codeine helps the most.   No obvious other patterns in day to day or daytime symptoms or assoc excess/ purulent sputum or mucus plugs or hemoptysis or cp or chest tightness,  or overt sinus or hb symptoms.    Also denies any obvious fluctuation of symptoms with weather or environmental changes or other aggravating or alleviating factors except as outlined above   No unusual exposure hx or h/o childhood pna/ asthma or knowledge of premature birth.  Current Allergies, Complete Past Medical History, Past Surgical History, Family History, and Social History were reviewed in [employer] record.  ROS  The following are not active complaints unless bolded Hoarseness, sore throat, dysphagia, dental problems, itching, sneezing,  nasal congestion or discharge of excess mucus or purulent secretions, ear ache,   fever, chills, sweats, unintended wt loss or wt  gain, classically pleuritic or exertional cp,  orthopnea pnd or arm/hand swelling  or leg swelling, presyncope, palpitations, abdominal pain, anorexia, nausea, vomiting, diarrhea  or change in bowel habits or change in bladder habits, change in stools or change in urine, dysuria, hematuria,  rash, arthralgias, visual complaints, headache, numbness, weakness or ataxia or problems with walking or coordination,  change in mood or  memory. fatigue            Past Medical History:  Diagnosis Date   Arthritis    Diabetes mellitus without complication (HCC)    Hypertension    Hypothyroidism    PONV (postoperative nausea and vomiting)     Outpatient Medications Prior to Visit  Medication Sig Dispense Refill   albuterol (VENTOLIN HFA) 108 (90 Base) MCG/ACT inhaler Inhale 2 puffs into the lungs every 4 (four) hours as needed for shortness of breath.     APPLE CIDER VINEGAR PO Take 1 tablet by mouth daily.     cetirizine (ZYRTEC) 10 MG tablet Take 10 mg by mouth daily.     CINNAMON PO Take 1,000 mg by mouth daily.     cyclobenzaprine (FLEXERIL) 5 MG tablet Take 1 tablet (5 mg total) by mouth 3 (three) times daily as needed for muscle spasms. 30 tablet 0   escitalopram (LEXAPRO) 5 MG tablet Take 5 mg by mouth daily.     HYDROcodone-acetaminophen (NORCO) 5-325 MG  tablet Take 1 tablet by mouth every 6 (six) hours as needed for moderate pain. 30 tablet 0   levothyroxine (SYNTHROID) 175 MCG tablet Take 175 mcg by mouth daily.     Melatonin 10 MG CAPS Take 10 mg by mouth at bedtime as needed (sleep).     meloxicam (MOBIC) 15 MG tablet Take 15 mg by mouth as needed for pain.     metFORMIN (GLUCOPHAGE) 500 MG tablet Take 500 mg by mouth 2 (two) times daily.     mirabegron ER (MYRBETRIQ) 25 MG TB24 tablet Take 1 tablet (25 mg total) by mouth daily. (Patient taking differently: Take 50 mg by mouth daily.) 30 tablet 0   Omega-3 Fatty Acids (FISH OIL) 1000 MG CAPS Take 1 capsule by mouth daily.     pregabalin  (LYRICA) 200 MG capsule Take 300 mg by mouth 2 (two) times daily.     rOPINIRole (REQUIP) 0.5 MG tablet Take 0.5 mg by mouth daily.     simvastatin (ZOCOR) 20 MG tablet Take 20 mg by mouth daily.     TIADYLT ER 360 MG 24 hr capsule Take 360 mg by mouth daily.     ondansetron (ZOFRAN) 4 MG tablet Take 1 tablet (4 mg total) by mouth every 6 (six) hours. (Patient not taking: Reported on 02/24/2022) 12 tablet 0   No facility-administered medications prior to visit.     Objective:     BP 133/70   Pulse 88   Ht 5\' 4"  (1.626 m)   Wt 200 lb 1.6 oz (90.8 kg)   SpO2 95% Comment: RA  BMI 34.35 kg/m   SpO2: 95 % (RA)  Obese wf pleasant but extremely challenging historian nad   HEENT : Oropharynx  clear      Nasal turbinates nl    NECK :  without  apparent JVD/ palpable Nodes/TM    LUNGS: no acc muscle use,  Nl contour chest which is clear to A and P bilaterally without cough on insp or exp maneuvers   CV:  RRR  no s3 or murmur or increase in P2, and no edema   ABD:  soft and nontender with nl inspiratory excursion in the supine position. No bruits or organomegaly appreciated   MS:  Nl gait/ ext warm without deformities Or obvious joint restrictions  calf tenderness, cyanosis or clubbing    SKIN: warm and dry without lesions    NEURO:  alert, approp, nl sensorium with  no motor or cerebellar deficits apparent. Mild resting tremor/ head bobbing    I personally reviewed images and agree with radiology impression as follows:  CXR:   pa and lateral  08/21/21 Negative for acute cardiopulmonary disease   Labs ordered/ reviewed:      Chemistry      Component Value Date/Time   NA 141 10/14/2022 1446   K 4.4 10/14/2022 1446   CL 101 10/14/2022 1446   CO2 20 10/14/2022 1446   BUN 14 10/14/2022 1446   CREATININE 1.02 (H) 10/14/2022 1446      Component Value Date/Time   CALCIUM 9.3 03/04/2022 1656   ALKPHOS 47 02/05/2022 0615   AST 26 02/05/2022 0615   ALT 28 02/05/2022 0615    BILITOT 0.5 02/05/2022 0615       Lab Results  Component Value Date   WBC 11.7 (H) 10/14/2022   HGB 13.8 10/14/2022   HCT 43.2 10/14/2022   MCV 90 10/14/2022   PLT 325 10/14/2022  Lab Results  Component Value Date   DDIMER 0.66 (H) 10/14/2022      Lab Results  Component Value Date   TSH 4.240 10/14/2022     BNP  4/26 /24  =  5.0        Assessment   DOE (dyspnea on exertion) Never smoker with no h/o atopy - Onset abrupt early March 2024 while [place] to usual parking place but did not recur - newly reported doe from [place] to [place] p event at church, stops half way, some better with Trelegy but still struggles each time she does it to complete s stopping - noct dry cough x "longtime" better with codeine , maybe once or twice better with albuterol  - FENO could not perform 10/14/2022  - 10/14/2022   Walked on RA  x  2  lap(s) =  approx 300  ft  @ slow pace, stopped due to tired s sob  with lowest 02 sats 95%   - 10/14/2022 rx for gerd >>>   Symptoms are markedly disproportionate to objective findings and not clear to what extent this is actually a pulmonary  problem but pt does appear to have difficult to sort out respiratory symptoms of unknown origin for which  DDX  = almost all start with A and  include Adherence, Ace Inhibitors, Acid Reflux, Active Sinus Disease, Alpha 1 Antitripsin deficiency, Anxiety masquerading as Airways dz,  ABPA,  Allergy(esp in young), Aspiration (esp in elderly), Adverse effects of meds,  Active smoking or Vaping, A bunch of PE's/clot burden (a few small clots can't cause this syndrome unless there is already severe underlying pulm or vascular dz with poor reserve),  Anemia or thyroid disorder, plus two Bs  = Bronchiectasis and Beta blocker use..and one C= CHF    Ddx is in bold highlights  Rec: PFTs Max rx for gerd Stop trelegy when it runs out to see if any change on vs off  Re SABA :  I spent extra time with pt today  reviewing appropriate use of albuterol for prn use on exertion with the following points: 1) saba is for relief of sob that does not improve by walking a slower pace or resting but rather if the pt does not improve after trying this first. 2) If the pt is convinced, as many are, that saba helps recover from activity faster then it's easy to tell if this is the case by re-challenging : ie stop, take the inhaler, then p 5 minutes try the exact same activity (intensity of workload) that just caused the symptoms and see if they are substantially diminished or not after saba 3) if there is an activity that reproducibly causes the symptoms, try the saba 15 min before the activity on alternate days   If in fact the saba really does help, then fine to continue to use it prn but advised may need to look closer at the maintenance regimen(trelgy for now vs p stop it) being used to achieve better control of airways disease with exertion.    Each maintenance medication was reviewed in detail including emphasizing most importantly the difference between maintenance and prns and under what circumstances the prns are to be triggered using an action plan format where appropriate.  Total time for H and P, chart review, counseling, reviewing hfa/dpi device(s) , directly observing portions of ambulatory 02 saturation study/ and generating customized AVS unique to this office visit / same day charting  >  60 min for multiple  refractory respiratory  symptoms of uncertain etiology in pt new to me                    Sandrea Hughs, MD 10/14/2022

## 2022-10-14 ENCOUNTER — Ambulatory Visit (HOSPITAL_COMMUNITY)
Admission: RE | Admit: 2022-10-14 | Discharge: 2022-10-14 | Disposition: A | Discharge status: Home / Self Care | Payer: PPO | Source: Ambulatory Visit | Attending: Urology | Admitting: Urology

## 2022-10-14 ENCOUNTER — Encounter: Payer: Self-pay | Admitting: Internal Medicine

## 2022-10-14 ENCOUNTER — Other Ambulatory Visit (HOSPITAL_COMMUNITY): Payer: Self-pay | Admitting: Family Medicine

## 2022-10-14 ENCOUNTER — Ambulatory Visit (INDEPENDENT_AMBULATORY_CARE_PROVIDER_SITE_OTHER): Payer: PPO | Admitting: Internal Medicine

## 2022-10-14 VITALS — BP 133/70 | HR 88 | Ht 64.0 in | Wt 200.1 lb

## 2022-10-14 DIAGNOSIS — R059 Cough, unspecified: Secondary | ICD-10-CM

## 2022-10-14 DIAGNOSIS — N200 Calculus of kidney: Secondary | ICD-10-CM | POA: Insufficient documentation

## 2022-10-14 DIAGNOSIS — R0609 Other forms of dyspnea: Secondary | ICD-10-CM | POA: Diagnosis not present

## 2022-10-14 DIAGNOSIS — Z1231 Encounter for screening mammogram for malignant neoplasm of breast: Secondary | ICD-10-CM

## 2022-10-14 DIAGNOSIS — N281 Cyst of kidney, acquired: Secondary | ICD-10-CM | POA: Diagnosis not present

## 2022-10-14 MED ORDER — FAMOTIDINE 20 MG PO TABS: ORAL_TABLET | ORAL | 11 refills | Status: DC

## 2022-10-14 MED ORDER — PANTOPRAZOLE SODIUM 40 MG PO TBEC: 40.0000 mg | DELAYED_RELEASE_TABLET | Freq: Every day | ORAL | 2 refills | Status: DC

## 2022-10-14 NOTE — Assessment & Plan Note (Addendum)
Never smoker with no h/o atopy - Onset abrupt early March 2024 while [place] usual parking place but did not recur - newly reported doe from [place] to [place] p event at church, stops half way, some better with Trelegy but still struggles each time she does it to complete s stopping - noct dry cough x "longtime" better with codeine , maybe once or twice better with albuterol  - FENO could not perform 10/14/2022  - 10/14/2022   Walked on [place]  x  2  lap(s) =  approx 300  ft  @ slow pace, stopped due to tired s sob  with lowest 02 sats 95%   - Allergy screen 10/14/22   >  Eos 0. 4/  IgE  <2   Symptoms are markedly disproportionate to objective findings and not clear to what extent this is actually a pulmonary  problem but pt does appear to have difficult to sort out respiratory symptoms of unknown origin for which  DDX  = almost all start with A and  include Adherence, Ace Inhibitors, Acid Reflux, Active Sinus Disease, Alpha 1 Antitripsin deficiency, Anxiety masquerading as Airways dz,  ABPA,  Allergy(esp in young), Aspiration (esp in elderly), Adverse effects of meds,  Active smoking or Vaping, A bunch of PE's/clot burden (a few small clots can't cause this syndrome unless there is already severe underlying pulm or vascular dz with poor reserve),  Anemia or thyroid disorder, plus two Bs  = Bronchiectasis and Beta blocker use..and one C= CHF    Ddx is in bold highlights: ? Acid (or non-acid) GERD > always difficult to exclude as up to 75% of pts in some series report no assoc GI/ Heartburn symptoms> rec max (24h)  acid suppression and diet restrictions/ reviewed and instructions given in writing.   ? Adverse drug effects > ? Side effects from DPI > try off to see what if any change occurs   Anemia/ thyroid dz > ruled out   ? A bunch of PEs >  D dimer  high normal value (seen commonly in the elderly or chronically ill)  may miss small peripheral pe, the clot burden with sob is  moderately high and the d dimer  has a very high neg pred value if used in this setting.    ? CHF > very unlikely with bnp so low   Rec: PFTs Max rx for gerd Stop trelegy when it runs out to see if any change on vs off  Re SABA :  I spent extra time with pt today reviewing appropriate use of albuterol for prn use on exertion with the following points: 1) saba is for relief of sob that does not improve by walking a slower pace or resting but rather if the pt does not improve after trying this first. 2) If the pt is convinced, as many are, that saba helps recover from activity faster then it's easy to tell if this is the case by re-challenging : ie stop, take the inhaler, then p 5 minutes try the exact same activity (intensity of workload) that just caused the symptoms and see if they are substantially diminished or not after saba 3) if there is an activity that reproducibly causes the symptoms, try the saba 15 min before the activity on alternate days   If in fact the saba really does help, then fine to continue to use it prn but advised may need to look closer at the  maintenance regimen(trelgy for now vs p stop it) being used to achieve better control of airways disease with exertion.    Each maintenance medication was reviewed in detail including emphasizing most importantly the difference between maintenance and prns and under what circumstances the prns are to be triggered using an action plan format where appropriate.  Total time for H and P, chart review, counseling, reviewing hfa/dpi device(s) , directly observing portions of ambulatory 02 saturation study/ and generating customized AVS unique to this office visit / same day charting  > 60 min for multiple  refractory respiratory  symptoms of uncertain etiology in pt new to me

## 2022-10-14 NOTE — Patient Instructions (Addendum)
Continue walking mailbox and back daily for a week and then stop trelegy to see what difference it makes off it - if worse then restart   Only use your albuterol as a rescue medication to be used if you can't catch your breath by resting or doing a relaxed purse lip breathing pattern.  - The less you use it, the better it will work when you need it. - Ok to use up to 2 puffs  every 4 hours if you must but call for immediate appointment if use goes up over your usual need - Don't leave home without it !!  (think of it like the spare tire for your car)   Also  Ok to try albuterol x 2 puffs x 15 min before an activity (on alternating days)  that you know would usually make you short of breath and see if it makes any difference and if makes none then don't take albuterol after activity unless you can't catch your breath as this means it's the resting that helps, not the albuterol.      Pantoprazole (protonix) 40 mg   Take  30-60 min before first meal of the day and Pepcid (famotidine)  20 mg after supper until return to office - this is the best way to tell whether stomach acid is contributing to your problem.    GERD (REFLUX)  is an extremely common cause of respiratory symptoms just like yours , many times with no obvious heartburn at all.    It can be treated with medication, but also with lifestyle changes including elevation of the head of your bed (ideally with 6 -8inch blocks under the headboard of your bed),  Smoking cessation, avoidance of late meals, excessive alcohol, and avoid fatty foods, chocolate, peppermint, colas, red wine, and acidic juices such as orange juice.  NO MINT OR MENTHOL PRODUCTS SO NO COUGH DROPS  USE SUGARLESS CANDY INSTEAD (Jolley ranchers or Stover's or Life Savers) or even ice chips will also do - the key is to swallow to prevent all throat clearing. NO OIL BASED VITAMINS - use powdered substitutes.  Avoid fish oil when coughing.    Please remember to go to the lab  department   for your tests - we will call you with the results when they are available.     My office will be contacting you by phone for referral for PFTs - do not take any inhalers that day - if you don't hear back from my office within one week please call us back or notify us thru MyChart and we'll address it right away.  Please schedule a follow up office visit in 4 weeks, sooner if needed  with all medications /inhalers/ solutions in hand so we can verify exactly what you are taking. This includes all medications from all doctors and over the counters

## 2022-10-20 ENCOUNTER — Telehealth: Payer: Self-pay | Admitting: *Deleted

## 2022-10-20 LAB — BASIC METABOLIC PANEL
BUN/Creatinine Ratio: 14 (ref 12–28)
BUN: 14 mg/dL (ref 8–27)
CO2: 20 mmol/L (ref 20–29)
Calcium: 9.7 mg/dL (ref 8.7–10.3)
Chloride: 101 mmol/L (ref 96–106)
Creatinine, Ser: 1.02 mg/dL — ABNORMAL HIGH (ref 0.57–1.00)
Glucose: 118 mg/dL — ABNORMAL HIGH (ref 70–99)
Potassium: 4.4 mmol/L (ref 3.5–5.2)
Sodium: 141 mmol/L (ref 134–144)
eGFR: 59 mL/min/{1.73_m2} — ABNORMAL LOW (ref >59)

## 2022-10-20 LAB — CBC WITH DIFFERENTIAL/PLATELET
Basophils Absolute: 0.1 10*3/uL (ref 0.0–0.2)
Basos: 1 %
EOS (ABSOLUTE): 0.4 10*3/uL (ref 0.0–0.4)
Eos: 4 %
Hematocrit: 43.2 % (ref 34.0–46.6)
Hemoglobin: 13.8 g/dL (ref 11.1–15.9)
Immature Grans (Abs): 0.1 10*3/uL (ref 0.0–0.1)
Immature Granulocytes: 1 %
Lymphocytes Absolute: 3.3 10*3/uL — ABNORMAL HIGH (ref 0.7–3.1)
Lymphs: 28 %
MCH: 28.6 pg (ref 26.6–33.0)
MCHC: 31.9 g/dL (ref 31.5–35.7)
MCV: 90 fL (ref 79–97)
Monocytes Absolute: 0.9 10*3/uL (ref 0.1–0.9)
Monocytes: 8 %
Neutrophils Absolute: 6.9 10*3/uL (ref 1.4–7.0)
Neutrophils: 58 %
Platelets: 325 10*3/uL (ref 150–450)
RBC: 4.82 x10E6/uL (ref 3.77–5.28)
RDW: 14.5 % (ref 11.7–15.4)
WBC: 11.7 10*3/uL — ABNORMAL HIGH (ref 3.4–10.8)

## 2022-10-20 LAB — SEDIMENTATION RATE: Sed Rate: 6 mm/hr (ref 0–40)

## 2022-10-20 LAB — BRAIN NATRIURETIC PEPTIDE: BNP: 5 pg/mL (ref 0.0–100.0)

## 2022-10-20 LAB — D-DIMER, QUANTITATIVE: D-DIMER: 0.66 mg/L FEU — ABNORMAL HIGH (ref 0.00–0.49)

## 2022-10-20 LAB — IGE: IgE (Immunoglobulin E), Serum: <2 IU/mL — ABNORMAL LOW (ref 6–495)

## 2022-10-20 LAB — TSH: TSH: 4.24 u[IU]/mL (ref 0.450–4.500)

## 2022-10-20 MED ORDER — BENZONATATE 200 MG PO CAPS: 200.0000 mg | ORAL_CAPSULE | Freq: Three times a day (TID) | ORAL | 0 refills | Status: DC | PRN

## 2022-10-20 NOTE — Telephone Encounter (Signed)
Patient came by office and states that since last OV, her balance seems to be off and the new medication Dr. Sherene Sires prescribed seems to be making things worse.   Please advise [phone]

## 2022-10-20 NOTE — Telephone Encounter (Signed)
I called and spoke with the pt  She states ever since last visit her cough seems worse  She states she has been using breztri inhaler and it's making her feel "off balance"  She states that her cough ha become "crackling" and nothing is coming up  She states taking the pepcid and protonix as prescribed  She is using her albuterol 1-2 x per day  She is asking for something to help with her cough Please advise, thanks!  Allergies  Allergen Reactions   Amlodipine Swelling   Keflex [Cephalexin] Rash

## 2022-10-20 NOTE — Telephone Encounter (Signed)
I called and spoke with the pt and notified of response per Dr Sherene Sires  She verbalized understanding  Rx for tessalon was sent  Will call sooner if needed

## 2022-10-20 NOTE — Telephone Encounter (Signed)
Can use albuterol up to every 4 hours as needed for breathing for now and stop the breztri (though I don't have that one listed in meds - I had her taking trelegy which I asked her to finish up and then stop anyway, so same would have applied with breztri)   For cough tessalon 200 mg every 6 hours as needed rx #30  Hydrocodone also works for cough if still can't control it   Move up f/u ov to next week with all meds in hand to regroup

## 2022-10-24 ENCOUNTER — Ambulatory Visit (HOSPITAL_COMMUNITY)
Admission: RE | Admit: 2022-10-24 | Discharge: 2022-10-24 | Disposition: A | Discharge status: Home / Self Care | Payer: PPO | Source: Ambulatory Visit | Attending: Family Medicine | Admitting: Family Medicine

## 2022-10-24 DIAGNOSIS — Z1231 Encounter for screening mammogram for malignant neoplasm of breast: Secondary | ICD-10-CM | POA: Diagnosis not present

## 2022-10-26 ENCOUNTER — Telehealth: Payer: Self-pay

## 2022-10-26 ENCOUNTER — Other Ambulatory Visit: Payer: Self-pay

## 2022-10-26 MED ORDER — MIRABEGRON ER 50 MG PO TB24: 50.0000 mg | ORAL_TABLET | Freq: Every day | ORAL | 3 refills | Status: DC

## 2022-10-26 NOTE — Telephone Encounter (Signed)
Patient stated the new medication is working. Insurance is requesting a 90 day refill.  mirabegron ER (MYRBETRIQ) 25 MG TB24 tablet   Pharmacy: [facility] [address], [place] - [address] [place] [address] Phone: [phone]  Fax: [phone]     Thank you!

## 2022-10-26 NOTE — Telephone Encounter (Signed)
Per MD note on 03/27 patient will increase to 50mg .  Rx for Myretriq 50mg  sent to pharmacy qty 90 with 3 refills.  Patient aware she will take 1 50mg  tablet daily.

## 2022-10-28 ENCOUNTER — Ambulatory Visit: Payer: PPO | Admitting: Urology

## 2022-10-28 ENCOUNTER — Telehealth: Payer: Self-pay | Admitting: Internal Medicine

## 2022-10-28 NOTE — Telephone Encounter (Signed)
Patient is aware of results and voiced her understanding.  Nothing further needed.    Call patient :  Studies are unremarkable, no change in recs

## 2022-11-12 DIAGNOSIS — R030 Elevated blood-pressure reading, without diagnosis of hypertension: Secondary | ICD-10-CM | POA: Diagnosis not present

## 2022-11-12 DIAGNOSIS — J209 Acute bronchitis, unspecified: Secondary | ICD-10-CM | POA: Diagnosis not present

## 2022-11-15 ENCOUNTER — Encounter: Payer: Self-pay | Admitting: Internal Medicine

## 2022-11-22 DIAGNOSIS — E669 Obesity, unspecified: Secondary | ICD-10-CM | POA: Diagnosis not present

## 2022-11-22 DIAGNOSIS — Z6835 Body mass index (BMI) 35.0-35.9, adult: Secondary | ICD-10-CM | POA: Diagnosis not present

## 2022-11-22 DIAGNOSIS — S93402A Sprain of unspecified ligament of left ankle, initial encounter: Secondary | ICD-10-CM | POA: Diagnosis not present

## 2022-11-26 ENCOUNTER — Emergency Department (HOSPITAL_COMMUNITY): Payer: PPO

## 2022-11-26 ENCOUNTER — Encounter (HOSPITAL_COMMUNITY): Payer: Self-pay | Admitting: *Deleted

## 2022-11-26 ENCOUNTER — Other Ambulatory Visit: Payer: Self-pay

## 2022-11-26 ENCOUNTER — Inpatient Hospital Stay (HOSPITAL_COMMUNITY)
Admission: EM | Admit: 2022-11-26 | Discharge: 2022-11-28 | DRG: 372 | Disposition: A | Discharge status: Home / Self Care | Payer: PPO | Attending: Family Medicine | Admitting: Family Medicine

## 2022-11-26 DIAGNOSIS — R197 Diarrhea, unspecified: Secondary | ICD-10-CM | POA: Diagnosis present

## 2022-11-26 DIAGNOSIS — E785 Hyperlipidemia, unspecified: Secondary | ICD-10-CM | POA: Diagnosis present

## 2022-11-26 DIAGNOSIS — R269 Unspecified abnormalities of gait and mobility: Secondary | ICD-10-CM | POA: Diagnosis not present

## 2022-11-26 DIAGNOSIS — N3281 Overactive bladder: Secondary | ICD-10-CM | POA: Diagnosis present

## 2022-11-26 DIAGNOSIS — N179 Acute kidney failure, unspecified: Secondary | ICD-10-CM | POA: Diagnosis present

## 2022-11-26 DIAGNOSIS — R0609 Other forms of dyspnea: Secondary | ICD-10-CM | POA: Diagnosis present

## 2022-11-26 DIAGNOSIS — E1165 Type 2 diabetes mellitus with hyperglycemia: Secondary | ICD-10-CM | POA: Diagnosis present

## 2022-11-26 DIAGNOSIS — A0472 Enterocolitis due to Clostridium difficile, not specified as recurrent: Secondary | ICD-10-CM | POA: Diagnosis not present

## 2022-11-26 DIAGNOSIS — Z888 Allergy status to other drugs, medicaments and biological substances status: Secondary | ICD-10-CM

## 2022-11-26 DIAGNOSIS — E860 Dehydration: Secondary | ICD-10-CM | POA: Diagnosis present

## 2022-11-26 DIAGNOSIS — Z87442 Personal history of urinary calculi: Secondary | ICD-10-CM

## 2022-11-26 DIAGNOSIS — I10 Essential (primary) hypertension: Secondary | ICD-10-CM | POA: Diagnosis present

## 2022-11-26 DIAGNOSIS — E1140 Type 2 diabetes mellitus with diabetic neuropathy, unspecified: Secondary | ICD-10-CM | POA: Diagnosis not present

## 2022-11-26 DIAGNOSIS — K219 Gastro-esophageal reflux disease without esophagitis: Secondary | ICD-10-CM | POA: Diagnosis present

## 2022-11-26 DIAGNOSIS — Z9049 Acquired absence of other specified parts of digestive tract: Secondary | ICD-10-CM

## 2022-11-26 DIAGNOSIS — D72829 Elevated white blood cell count, unspecified: Secondary | ICD-10-CM | POA: Diagnosis not present

## 2022-11-26 DIAGNOSIS — E872 Acidosis, unspecified: Secondary | ICD-10-CM | POA: Diagnosis not present

## 2022-11-26 DIAGNOSIS — Z7984 Long term (current) use of oral hypoglycemic drugs: Secondary | ICD-10-CM

## 2022-11-26 DIAGNOSIS — K449 Diaphragmatic hernia without obstruction or gangrene: Secondary | ICD-10-CM | POA: Diagnosis not present

## 2022-11-26 DIAGNOSIS — Z79890 Hormone replacement therapy (postmenopausal): Secondary | ICD-10-CM | POA: Diagnosis not present

## 2022-11-26 DIAGNOSIS — M1990 Unspecified osteoarthritis, unspecified site: Secondary | ICD-10-CM | POA: Diagnosis present

## 2022-11-26 DIAGNOSIS — K529 Noninfective gastroenteritis and colitis, unspecified: Secondary | ICD-10-CM | POA: Diagnosis present

## 2022-11-26 DIAGNOSIS — E890 Postprocedural hypothyroidism: Secondary | ICD-10-CM | POA: Diagnosis present

## 2022-11-26 DIAGNOSIS — Z90710 Acquired absence of both cervix and uterus: Secondary | ICD-10-CM

## 2022-11-26 DIAGNOSIS — G2581 Restless legs syndrome: Secondary | ICD-10-CM | POA: Diagnosis not present

## 2022-11-26 DIAGNOSIS — Z79899 Other long term (current) drug therapy: Secondary | ICD-10-CM | POA: Diagnosis not present

## 2022-11-26 DIAGNOSIS — K5730 Diverticulosis of large intestine without perforation or abscess without bleeding: Secondary | ICD-10-CM | POA: Diagnosis not present

## 2022-11-26 DIAGNOSIS — E039 Hypothyroidism, unspecified: Secondary | ICD-10-CM | POA: Diagnosis not present

## 2022-11-26 LAB — CBC
HCT: 46.1 % — ABNORMAL HIGH (ref 36.0–46.0)
Hemoglobin: 15 g/dL (ref 12.0–15.0)
MCH: 29.8 pg (ref 26.0–34.0)
MCHC: 32.5 g/dL (ref 30.0–36.0)
MCV: 91.5 fL (ref 80.0–100.0)
Platelets: 339 10*3/uL (ref 150–400)
RBC: 5.04 MIL/uL (ref 3.87–5.11)
RDW: 16.5 % — ABNORMAL HIGH (ref 11.5–15.5)
WBC: 18.5 10*3/uL — ABNORMAL HIGH (ref 4.0–10.5)
nRBC: 0 % (ref 0.0–0.2)

## 2022-11-26 LAB — COMPREHENSIVE METABOLIC PANEL
ALT: 37 U/L (ref 0–44)
AST: 28 U/L (ref 15–41)
Albumin: 4.9 g/dL (ref 3.5–5.0)
Alkaline Phosphatase: 103 U/L (ref 38–126)
Anion gap: 13 (ref 5–15)
BUN: 41 mg/dL — ABNORMAL HIGH (ref 8–23)
CO2: 13 mmol/L — ABNORMAL LOW (ref 22–32)
Calcium: 10 mg/dL (ref 8.9–10.3)
Chloride: 106 mmol/L (ref 98–111)
Creatinine, Ser: 2.3 mg/dL — ABNORMAL HIGH (ref 0.44–1.00)
GFR, Estimated: 22 mL/min — ABNORMAL LOW (ref >60)
Glucose, Bld: 234 mg/dL — ABNORMAL HIGH (ref 70–99)
Potassium: 5.1 mmol/L (ref 3.5–5.1)
Sodium: 132 mmol/L — ABNORMAL LOW (ref 135–145)
Total Bilirubin: 0.9 mg/dL (ref 0.3–1.2)
Total Protein: 8.7 g/dL — ABNORMAL HIGH (ref 6.5–8.1)

## 2022-11-26 LAB — URINALYSIS, ROUTINE W REFLEX MICROSCOPIC
Bilirubin Urine: NEGATIVE
Glucose, UA: NEGATIVE mg/dL
Hgb urine dipstick: NEGATIVE
Ketones, ur: NEGATIVE mg/dL
Nitrite: NEGATIVE
Protein, ur: 100 mg/dL — AB
Specific Gravity, Urine: 1.019 (ref 1.005–1.030)
pH: 5 (ref 5.0–8.0)

## 2022-11-26 LAB — DIFFERENTIAL
Abs Immature Granulocytes: 0.22 10*3/uL — ABNORMAL HIGH (ref 0.00–0.07)
Basophils Absolute: 0.1 10*3/uL (ref 0.0–0.1)
Basophils Relative: 1 %
Eosinophils Absolute: 0.2 10*3/uL (ref 0.0–0.5)
Eosinophils Relative: 1 %
Immature Granulocytes: 1 %
Lymphocytes Relative: 11 %
Lymphs Abs: 1.9 10*3/uL (ref 0.7–4.0)
Monocytes Absolute: 1.2 10*3/uL — ABNORMAL HIGH (ref 0.1–1.0)
Monocytes Relative: 7 %
Neutro Abs: 13.5 10*3/uL — ABNORMAL HIGH (ref 1.7–7.7)
Neutrophils Relative %: 79 %

## 2022-11-26 LAB — HEMOGLOBIN A1C
Hgb A1c MFr Bld: 7.2 % — ABNORMAL HIGH (ref 4.8–5.6)
Mean Plasma Glucose: 159.94 mg/dL

## 2022-11-26 LAB — C DIFFICILE QUICK SCREEN W PCR REFLEX
C Diff antigen: POSITIVE — AB
C Diff toxin: NEGATIVE

## 2022-11-26 LAB — LIPASE, BLOOD: Lipase: 35 U/L (ref 11–51)

## 2022-11-26 LAB — GLUCOSE, CAPILLARY
Glucose-Capillary: 227 mg/dL — ABNORMAL HIGH (ref 70–99)
Glucose-Capillary: 169 mg/dL — ABNORMAL HIGH (ref 70–99)

## 2022-11-26 LAB — CBG MONITORING, ED: Glucose-Capillary: 224 mg/dL — ABNORMAL HIGH (ref 70–99)

## 2022-11-26 MED ORDER — CIPROFLOXACIN IN D5W 400 MG/200ML IV SOLN
400.0000 mg | INTRAVENOUS | Status: DC
  Administered 2022-11-26: 400 mg via INTRAVENOUS
  Filled 2022-11-26: qty 200

## 2022-11-26 MED ORDER — INSULIN ASPART 100 UNIT/ML IJ SOLN
0.0000 [IU] | Freq: Three times a day (TID) | INTRAMUSCULAR | Status: DC
  Administered 2022-11-26: 3 [IU] via SUBCUTANEOUS
  Administered 2022-11-27: 2 [IU] via SUBCUTANEOUS
  Administered 2022-11-27: 5 [IU] via SUBCUTANEOUS
  Administered 2022-11-27: 3 [IU] via SUBCUTANEOUS
  Administered 2022-11-28: 2 [IU] via SUBCUTANEOUS
  Administered 2022-11-28: 3 [IU] via SUBCUTANEOUS

## 2022-11-26 MED ORDER — INSULIN ASPART 100 UNIT/ML IJ SOLN
3.0000 [IU] | Freq: Three times a day (TID) | INTRAMUSCULAR | Status: DC
  Administered 2022-11-26 – 2022-11-28 (×5): 3 [IU] via SUBCUTANEOUS

## 2022-11-26 MED ORDER — OXYCODONE HCL 5 MG PO TABS: 5.0000 mg | ORAL_TABLET | Freq: Four times a day (QID) | ORAL | Status: DC | PRN

## 2022-11-26 MED ORDER — PANTOPRAZOLE SODIUM 40 MG IV SOLR
40.0000 mg | Freq: Every day | INTRAVENOUS | Status: DC
  Administered 2022-11-26 – 2022-11-27 (×2): 40 mg via INTRAVENOUS
  Filled 2022-11-26 (×2): qty 10

## 2022-11-26 MED ORDER — ENOXAPARIN SODIUM 30 MG/0.3ML IJ SOSY
30.0000 mg | PREFILLED_SYRINGE | INTRAMUSCULAR | Status: DC
  Administered 2022-11-26: 30 mg via SUBCUTANEOUS
  Filled 2022-11-26: qty 0.3

## 2022-11-26 MED ORDER — MIRABEGRON ER 25 MG PO TB24
50.0000 mg | ORAL_TABLET | Freq: Every day | ORAL | Status: DC
  Administered 2022-11-27 – 2022-11-28 (×2): 50 mg via ORAL
  Filled 2022-11-26 (×3): qty 2

## 2022-11-26 MED ORDER — ZOLPIDEM TARTRATE 5 MG PO TABS: 5.0000 mg | ORAL_TABLET | Freq: Every evening | ORAL | Status: DC | PRN

## 2022-11-26 MED ORDER — ROPINIROLE HCL 0.25 MG PO TABS: 0.5000 mg | ORAL_TABLET | Freq: Every day | ORAL | Status: DC | PRN

## 2022-11-26 MED ORDER — ACETAMINOPHEN 325 MG PO TABS: 650.0000 mg | ORAL_TABLET | Freq: Four times a day (QID) | ORAL | Status: DC | PRN

## 2022-11-26 MED ORDER — LEVOTHYROXINE SODIUM 50 MCG PO TABS
175.0000 ug | ORAL_TABLET | Freq: Every day | ORAL | Status: DC
  Administered 2022-11-27 – 2022-11-28 (×2): 175 ug via ORAL
  Filled 2022-11-26 (×2): qty 2

## 2022-11-26 MED ORDER — FENTANYL CITRATE PF 50 MCG/ML IJ SOSY: 12.5000 ug | PREFILLED_SYRINGE | INTRAMUSCULAR | Status: DC | PRN

## 2022-11-26 MED ORDER — ACETAMINOPHEN 650 MG RE SUPP: 650.0000 mg | Freq: Four times a day (QID) | RECTAL | Status: DC | PRN

## 2022-11-26 MED ORDER — STERILE WATER FOR INJECTION IV SOLN
INTRAVENOUS | Status: DC
  Filled 2022-11-26 (×10): qty 1000

## 2022-11-26 MED ORDER — ONDANSETRON HCL 4 MG/2ML IJ SOLN: 4.0000 mg | Freq: Four times a day (QID) | INTRAMUSCULAR | Status: DC | PRN

## 2022-11-26 MED ORDER — ONDANSETRON HCL 4 MG/2ML IJ SOLN
4.0000 mg | Freq: Once | INTRAMUSCULAR | Status: AC
  Administered 2022-11-26: 4 mg via INTRAVENOUS
  Filled 2022-11-26: qty 2

## 2022-11-26 MED ORDER — ESCITALOPRAM OXALATE 10 MG PO TABS
5.0000 mg | ORAL_TABLET | Freq: Every day | ORAL | Status: DC
  Administered 2022-11-27 – 2022-11-28 (×2): 5 mg via ORAL
  Filled 2022-11-26 (×2): qty 1

## 2022-11-26 MED ORDER — METRONIDAZOLE 500 MG/100ML IV SOLN
500.0000 mg | Freq: Two times a day (BID) | INTRAVENOUS | Status: DC
  Administered 2022-11-26 – 2022-11-27 (×2): 500 mg via INTRAVENOUS
  Filled 2022-11-26 (×2): qty 100

## 2022-11-26 MED ORDER — BOOST / RESOURCE BREEZE PO LIQD CUSTOM
1.0000 | Freq: Three times a day (TID) | ORAL | Status: DC
  Administered 2022-11-26 – 2022-11-28 (×6): 1 via ORAL

## 2022-11-26 MED ORDER — ONDANSETRON HCL 4 MG PO TABS: 4.0000 mg | ORAL_TABLET | Freq: Four times a day (QID) | ORAL | Status: DC | PRN

## 2022-11-26 MED ORDER — DILTIAZEM HCL ER COATED BEADS 120 MG PO CP24
120.0000 mg | ORAL_CAPSULE | Freq: Every day | ORAL | Status: DC
  Administered 2022-11-27 – 2022-11-28 (×2): 120 mg via ORAL
  Filled 2022-11-26 (×2): qty 1

## 2022-11-26 MED ORDER — LACTATED RINGERS IV BOLUS: 1000.0000 mL | Freq: Once | INTRAVENOUS | Status: DC

## 2022-11-26 MED ORDER — SODIUM CHLORIDE 0.9 % IV BOLUS
1000.0000 mL | Freq: Once | INTRAVENOUS | Status: AC
  Administered 2022-11-26: 1000 mL via INTRAVENOUS

## 2022-11-26 NOTE — ED Triage Notes (Signed)
Pt with c/o diarrhea since Thursday. + nausea this morning. Denies any pain. Denies any sick contacts or travel outside of country.

## 2022-11-26 NOTE — ED Provider Notes (Signed)
[place] EMERGENCY DEPARTMENT AT [facility] Provider Note   CSN: [id number] Arrival date & time: 11/26/22  1042     History  Chief Complaint  Patient presents with   Diarrhea    Patricia [name] is a 72 y.o. female.  Is the ER today complaining of diarrhea that is watery and nonbloody, constantly throughout the day x 3 days.  Started having nausea this morning, no abdominal pain or vomiting, no fevers or urinary symptoms, no other complaints.  Denies any recent antibiotics, no sick contacts.  No history of similar issues.   Diarrhea      Home Medications Prior to Admission medications   Medication Sig Start Date End Date Taking? Authorizing Provider  albuterol (VENTOLIN HFA) 108 (90 Base) MCG/ACT inhaler Inhale 2 puffs into the lungs every 4 (four) hours as needed for shortness of breath. 08/22/22   [provider]  APPLE CIDER VINEGAR PO Take 1 tablet by mouth daily.    [provider]  benzonatate (TESSALON) 200 MG capsule Take 1 capsule (200 mg total) by mouth 3 (three) times daily as needed for cough. 10/20/22   Nyoka Cowden, MD  cetirizine (ZYRTEC) 10 MG tablet Take 10 mg by mouth daily.    [provider]  CINNAMON PO Take 1,000 mg by mouth daily.    [provider]  cyclobenzaprine (FLEXERIL) 5 MG tablet Take 1 tablet (5 mg total) by mouth 3 (three) times daily as needed for muscle spasms. 09/14/22   McKenzie, Mardene Josuel Koeppen, MD  escitalopram (LEXAPRO) 5 MG tablet Take 5 mg by mouth daily. 07/17/19   [provider]  famotidine (PEPCID) 20 MG tablet One after supper 10/14/22   Nyoka Cowden, MD  HYDROcodone-acetaminophen (NORCO) 5-325 MG tablet Take 1 tablet by mouth every 6 (six) hours as needed for moderate pain. 03/03/22   McKenzie, Mardene Cailey Trigueros, MD  levothyroxine (SYNTHROID) 175 MCG tablet Take 175 mcg by mouth daily. 11/10/21   [provider]  Melatonin 10 MG CAPS Take 10 mg by mouth at bedtime as needed (sleep).     [provider]  meloxicam (MOBIC) 15 MG tablet Take 15 mg by mouth as needed for pain.    [provider]  metFORMIN (GLUCOPHAGE) 500 MG tablet Take 500 mg by mouth 2 (two) times daily. 11/08/21   [provider]  mirabegron ER (MYRBETRIQ) 50 MG TB24 tablet Take 1 tablet (50 mg total) by mouth daily. 10/26/22   McKenzie, Mardene Shyleigh Daughtry, MD  Omega-3 Fatty Acids (FISH OIL) 1000 MG CAPS Take 1 capsule by mouth daily.    [provider]  pantoprazole (PROTONIX) 40 MG tablet Take 1 tablet (40 mg total) by mouth daily. Take 30-60 min before first meal of the day 10/14/22   Nyoka Cowden, MD  pregabalin (LYRICA) 200 MG capsule Take 300 mg by mouth 2 (two) times daily. 01/18/22   [provider]  rOPINIRole (REQUIP) 0.5 MG tablet Take 0.5 mg by mouth daily. 08/05/22   [provider]  simvastatin (ZOCOR) 20 MG tablet Take 20 mg by mouth daily.    [provider]  TIADYLT ER 360 MG 24 hr capsule Take 360 mg by mouth daily. 11/28/21   [provider]      Allergies    Amlodipine and Keflex [cephalexin]    Review of Systems   Review of Systems  Gastrointestinal:  Positive for diarrhea.    Physical Exam Updated Vital Signs BP  111/72 (BP Location: Right Arm)   Pulse 89   Temp 98.2 F (36.8 C) (Oral)   Resp 20   Ht 5\' 4"  (1.626 m)   Wt 83.5 kg   SpO2 94%   BMI 31.58 kg/m  Physical Exam Vitals and nursing note reviewed.  Constitutional:      General: She is not in acute distress.    Appearance: She is well-developed.  HENT:     Head: Normocephalic and atraumatic.     Mouth/Throat:     Mouth: Mucous membranes are moist.  Eyes:     Conjunctiva/sclera: Conjunctivae normal.  Cardiovascular:     Rate and Rhythm: Normal rate and regular rhythm.     Heart sounds: No murmur heard. Pulmonary:     Effort: Pulmonary effort is normal. No respiratory distress.     Breath sounds: Normal breath sounds.  Abdominal:     Palpations:  Abdomen is soft.     Tenderness: There is no abdominal tenderness.  Musculoskeletal:        General: No swelling.     Cervical back: Neck supple.  Skin:    General: Skin is warm and dry.     Capillary Refill: Capillary refill takes less than 2 seconds.  Neurological:     General: No focal deficit present.     Mental Status: She is alert and oriented to person, place, and time.  Psychiatric:        Mood and Affect: Mood normal.     ED Results / Procedures / Treatments   Labs (all labs ordered are listed, but only abnormal results are displayed) Labs Reviewed  CBC - Abnormal; Notable for the following components:      Result Value   WBC 18.5 (*)    HCT 46.1 (*)    RDW 16.5 (*)    All other components within normal limits  URINALYSIS, ROUTINE W REFLEX MICROSCOPIC - Abnormal; Notable for the following components:   Color, Urine AMBER (*)    APPearance CLOUDY (*)    Protein, ur 100 (*)    Leukocytes,Ua TRACE (*)    Bacteria, UA RARE (*)    All other components within normal limits  CBG MONITORING, ED - Abnormal; Notable for the following components:   Glucose-Capillary 224 (*)    All other components within normal limits  LIPASE, BLOOD  COMPREHENSIVE METABOLIC PANEL    EKG None  Radiology No results found.  Procedures Procedures    Medications Ordered in ED Medications  lactated ringers bolus 1,000 mL (has no administration in time range)  ondansetron (ZOFRAN) injection 4 mg (has no administration in time range)    ED Course/ Medical Decision Making/ A&P                             Medical Decision Making This patient presents to the ED for concern of diarrhea, this involves an extensive number of treatment options, and is a complaint that carries with it a high risk of complications and morbidity.  The differential diagnosis includes colitis, C. difficile, and blood bacteria, E. coli, inflammatory bowel disease, other   Co morbidities that complicate the  patient evaluation :   Diabetes, kidney stones   Additional history obtained:  Additional history obtained from EMR External records from outside source obtained and reviewed including prior notes, med list   Lab Tests:  I Ordered, and personally interpreted labs.  The pertinent results include:  CBC shows leukocytosis of 18.5, neutrophils are 13.5   Imaging Studies ordered:  I ordered imaging studies including CT abdomen pelvis I independently visualized and interpreted imaging which showed fluid filled bowel, no acute intra-abdominal findings otherwise, no signs of bowel wall thickening I agree with the radiologist interpretation    Consultations Obtained:  I requested consultation with the close Dr. Laural Benes,  and discussed lab and imaging findings as well as pertinent plan - they recommend: Admission   Problem List / ED Course / Critical interventions / Medication management  Patient is having several days of diarrhea that has been nonbloody with no associated symptoms otherwise, given her leukocytosis we did do a CT scan did not show any bowel wall thickening, or any other acute findings, labs show AKI,, CO2 is low at 13 likely from her diarrhea, no increased anion gap, sodium slightly low.  Is getting IV fluids but needs to be admitted due to her AKI likely due to volume depletion from her diarrhea. I ordered medication including zofran  for nausea, NS for dehydration  Reevaluation of the patient after these medicines showed that the patient improved I have reviewed the patients home medicines and have made adjustments as needed   Social Determinants of Health:  Pt lives alone  Amount and/or Complexity of Data Reviewed Labs: ordered. Radiology: ordered.  Risk Prescription drug management.            Final Clinical Impression(s) / ED Diagnoses Final diagnoses:  None    Rx / DC Orders ED Discharge Orders     None         Josem Kaufmann 11/26/22 1326    Eber Hong, MD 11/26/22 1710

## 2022-11-26 NOTE — ED Notes (Signed)
CBG taken in triage

## 2022-11-26 NOTE — Hospital Course (Signed)
72 year old female with type 2 diabetes mellitus, hypothyroidism, hypertension, osteoarthritis, RLS, postsurgical hypothyroidism, hyperlipidemia, GERD, history of nephrolithiasis, depression, diabetic neuropathy, overactive bladder who presented to the emergency department after suffering with severe ongoing diarrhea for the past 3 to 4 days.  She reports that she ate something from [employer] that made her ill and she tried to give it to her dog but the dog would not eat it.  Patient has had nausea and poor oral intake over the past 2 to 3 days.  She denies any sick contacts or recent travel outside of the country.  She denies having abdominal pain.  She denies emesis.  She denies chest pain.  She denies fever.  She reports that her diarrhea has been watery and nonbloody.  It has been frequent over the past 3 days.  She is not able to eat or drink without having diarrhea.  She has noticed some distention in her abdomen.  In the ED she was noted to have acute kidney injury and metabolic acidosis.  She was sent for CT of the abdomen with findings suggesting enterocolitis.  She is being admitted into the hospital for further management.

## 2022-11-26 NOTE — Progress Notes (Signed)
Patient arrived to unit with stable vitals. She is alert and oriented. Speaking to son on the phone who expressed concerns over patient. Patient ambulates well. Glasses and phone at bedside.

## 2022-11-26 NOTE — H&P (Signed)
History and Physical  [facility]  Patricia [name] ZOX:[id number] DOB: Jul 03, 1950 DOA: 11/26/2022  PCP: Assunta Found, MD  Patient coming from: Home by private vehicle Level of care: Med-Surg  I have personally briefly reviewed patient's old medical records in [facility]  Chief Complaint: diarrhea   HPI: Patricia [name] is a 72 year old female with type 2 diabetes mellitus, hypothyroidism, hypertension, osteoarthritis, RLS, postsurgical hypothyroidism, hyperlipidemia, GERD, history of nephrolithiasis, depression, diabetic neuropathy, overactive bladder who presented to the emergency department after suffering with severe ongoing diarrhea for the past 3 to 4 days.  She reports that she ate something from [place] that made her ill and she tried to give it to her dog but the dog would not eat it.  Patient has had nausea and poor oral intake over the past 2 to 3 days.  She denies any sick contacts or recent travel outside of the country.  She denies having abdominal pain.  She denies emesis.  She denies chest pain.  She denies fever.  She reports that her diarrhea has been watery and nonbloody.  It has been frequent over the past 3 days.  She is not able to eat or drink without having diarrhea.  She has noticed some distention in her abdomen.  In the ED she was noted to have acute kidney injury and metabolic acidosis.  She was sent for CT of the abdomen with findings suggesting enterocolitis.  She is being admitted into the hospital for further management.    Past Medical History:  Diagnosis Date   Arthritis    Diabetes mellitus without complication (HCC)    Hypertension    Hypothyroidism    PONV (postoperative nausea and vomiting)     Past Surgical History:  Procedure Laterality Date   ABDOMINAL HYSTERECTOMY     CHOLECYSTECTOMY N/A 03/02/2015   Procedure: LAPAROSCOPIC CHOLECYSTECTOMY;  Surgeon: Franky Macho, MD;  Location: [facility];  Service: General;  Laterality: N/A;    CYSTOSCOPY W/ URETERAL STENT PLACEMENT Bilateral 02/02/2022   Procedure: CYSTOSCOPY WITH RETROGRADE PYELOGRAM;  Surgeon: Malen Gauze, MD;  Location: [facility];  Service: Urology;  Laterality: Bilateral;   CYSTOSCOPY WITH RETROGRADE PYELOGRAM, URETEROSCOPY AND STENT PLACEMENT Bilateral 03/03/2022   Procedure: CYSTOSCOPY WITH RETROGRADE PYELOGRAM, URETEROSCOPY AND STENT PLACEMENT;  Surgeon: Malen Gauze, MD;  Location: [facility];  Service: Urology;  Laterality: Bilateral;   HOLMIUM LASER APPLICATION Right 05/02/2019   Procedure: HOLMIUM LASER APPLICATION;  Surgeon: Marcine Matar, MD;  Location: [facility];  Service: Urology;  Laterality: Right;   IR URETERAL STENT RIGHT NEW ACCESS W/O SEP NEPHROSTOMY CATH  05/02/2019   KNEE ARTHROSCOPY Right    LIVER BIOPSY  03/02/2015   Procedure: LAPAROSCOPIC LIVER BIOPSY;  Surgeon: Franky Macho, MD;  Location: [facility];  Service: General;;   NEPHROLITHOTOMY Right 05/02/2019   Procedure: NEPHROLITHOTOMY PERCUTANEOUS;  Surgeon: Marcine Matar, MD;  Location: [facility];  Service: Urology;  Laterality: Right;  3 HRS   THYROIDECTOMY     TONSILLECTOMY  1970   TUBAL LIGATION       reports that she has never smoked. She has never used smokeless tobacco. She reports that she does not drink alcohol and does not use drugs.  Allergies  Allergen Reactions   Amlodipine Swelling   Keflex [Cephalexin] Rash    History reviewed. No pertinent family history.  Prior to Admission medications   Medication Sig Start Date End Date Taking? Authorizing Provider  albuterol (VENTOLIN HFA) 108 (90  Base) MCG/ACT inhaler Inhale 2 puffs into the lungs every 4 (four) hours as needed for shortness of breath. 08/22/22  Yes [provider]  escitalopram (LEXAPRO) 5 MG tablet Take 5 mg by mouth daily. 07/17/19  Yes [provider]  famotidine (PEPCID) 20 MG tablet One after supper 10/14/22  Yes Nyoka Cowden, MD  levothyroxine (SYNTHROID) 175 MCG tablet Take 175  mcg by mouth daily. 11/10/21  Yes [provider]  metFORMIN (GLUCOPHAGE) 500 MG tablet Take 500 mg by mouth 2 (two) times daily. 11/08/21  Yes [provider]  mirabegron ER (MYRBETRIQ) 50 MG TB24 tablet Take 1 tablet (50 mg total) by mouth daily. 10/26/22  Yes McKenzie, Mardene Celeste, MD  pantoprazole (PROTONIX) 40 MG tablet Take 1 tablet (40 mg total) by mouth daily. Take 30-60 min before first meal of the day 10/14/22  Yes Nyoka Cowden, MD  pregabalin (LYRICA) 300 MG capsule Take 300 mg by mouth 2 (two) times daily. 01/18/22  Yes [provider]  rOPINIRole (REQUIP) 0.5 MG tablet Take 0.5 mg by mouth daily. 08/05/22  Yes [provider]  simvastatin (ZOCOR) 20 MG tablet Take 20 mg by mouth daily.   Yes [provider]  TIADYLT ER 360 MG 24 hr capsule Take 360 mg by mouth daily. 11/28/21  Yes [provider]  APPLE CIDER VINEGAR PO Take 1 tablet by mouth daily.    [provider]  benzonatate (TESSALON) 200 MG capsule Take 1 capsule (200 mg total) by mouth 3 (three) times daily as needed for cough. 10/20/22   Nyoka Cowden, MD  cetirizine (ZYRTEC) 10 MG tablet Take 10 mg by mouth daily.    [provider]  CINNAMON PO Take 1,000 mg by mouth daily.    [provider]  cyclobenzaprine (FLEXERIL) 5 MG tablet Take 1 tablet (5 mg total) by mouth 3 (three) times daily as needed for muscle spasms. 09/14/22   McKenzie, Mardene Celeste, MD  HYDROcodone-acetaminophen (NORCO) 5-325 MG tablet Take 1 tablet by mouth every 6 (six) hours as needed for moderate pain. 03/03/22   McKenzie, Mardene Celeste, MD  Melatonin 10 MG CAPS Take 10 mg by mouth at bedtime as needed (sleep).    [provider]  meloxicam (MOBIC) 15 MG tablet Take 15 mg by mouth as needed for pain.    [provider]  Omega-3 Fatty Acids (FISH OIL) 1000 MG CAPS Take 1 capsule by mouth daily.    [provider]    Physical Exam: Vitals:   11/26/22 1055  11/26/22 1057 11/26/22 1109 11/26/22 1406  BP:  111/72  (!) 113/91  Pulse:  91 89 77  Resp:  20  18  Temp:  98.2 F (36.8 C)  97.8 F (36.6 C)  TempSrc:  Oral  Oral  SpO2:  90% 94% 98%  Weight: 83.5 kg   89.7 kg  Height: 5\' 4"  (1.626 m)   5\' 4"  (1.626 m)   Constitutional: dry mucus membranes, NAD, calm, comfortable.  Eyes: PERRL, lids and conjunctivae normal ENMT: Mucous membranes are dry. Posterior pharynx clear of any exudate or lesions.Normal dentition.  Neck: normal, supple, no masses, no thyromegaly Respiratory: clear to auscultation bilaterally, no wheezing, no crackles. Normal respiratory effort. No accessory muscle use.  Cardiovascular: normal s1, s2 sounds, no murmurs / rubs / gallops. No extremity edema. 2+ pedal pulses. No carotid bruits.  Abdomen: mildly distended, no tenderness, no masses palpated. No hepatosplenomegaly. Bowel sounds positive.  Musculoskeletal: no clubbing / cyanosis. No joint deformity upper and lower extremities. Good ROM, no contractures. Normal muscle tone.  Skin: no rashes, lesions, ulcers. No induration Neurologic: CN 2-12 grossly intact. Sensation intact, DTR normal. Strength 5/5 in all 4.  Psychiatric: Normal judgment and insight. Alert and oriented x 3. Normal mood.   Labs on Admission: I have personally reviewed following labs and imaging studies  CBC: Recent Labs  Lab 11/26/22 1125 11/26/22 1205  WBC 18.5*  --   NEUTROABS  --  13.5*  HGB 15.0  --   HCT 46.1*  --   MCV 91.5  --   PLT 339  --    Basic Metabolic Panel: Recent Labs  Lab 11/26/22 1125  NA 132*  K 5.1  CL 106  CO2 13*  GLUCOSE 234*  BUN 41*  CREATININE 2.30*  CALCIUM 10.0   GFR: Estimated Creatinine Clearance: 24.3 mL/min (A) (by C-G formula based on SCr of 2.3 mg/dL (H)). Liver Function Tests: Recent Labs  Lab 11/26/22 1125  AST 28  ALT 37  ALKPHOS 103  BILITOT 0.9  PROT 8.7*  ALBUMIN 4.9   Recent Labs  Lab 11/26/22 1125  LIPASE 35   No results  for input(s): "AMMONIA" in the last 168 hours. Coagulation Profile: No results for input(s): "INR", "PROTIME" in the last 168 hours. Cardiac Enzymes: No results for input(s): "CKTOTAL", "CKMB", "CKMBINDEX", "TROPONINI" in the last 168 hours. BNP (last 3 results) No results for input(s): "PROBNP" in the last 8760 hours. HbA1C: No results for input(s): "HGBA1C" in the last 72 hours. CBG: Recent Labs  Lab 11/26/22 1058  GLUCAP 224*   Lipid Profile: No results for input(s): "CHOL", "HDL", "LDLCALC", "TRIG", "CHOLHDL", "LDLDIRECT" in the last 72 hours. Thyroid Function Tests: No results for input(s): "TSH", "T4TOTAL", "FREET4", "T3FREE", "THYROIDAB" in the last 72 hours. Anemia Panel: No results for input(s): "VITAMINB12", "FOLATE", "FERRITIN", "TIBC", "IRON", "RETICCTPCT" in the last 72 hours. Urine analysis:    Component Value Date/Time   COLORURINE AMBER (A) 11/26/2022 1121   APPEARANCEUR CLOUDY (A) 11/26/2022 1121   APPEARANCEUR Clear 09/14/2022 1407   LABSPEC 1.019 11/26/2022 1121   PHURINE 5.0 11/26/2022 1121   GLUCOSEU NEGATIVE 11/26/2022 1121   HGBUR NEGATIVE 11/26/2022 1121   BILIRUBINUR NEGATIVE 11/26/2022 1121   BILIRUBINUR Negative 09/14/2022 1407   KETONESUR NEGATIVE 11/26/2022 1121   PROTEINUR 100 (A) 11/26/2022 1121   UROBILINOGEN 0.2 08/06/2019 1043   UROBILINOGEN 0.2 08/12/2009 1550   NITRITE NEGATIVE 11/26/2022 1121   LEUKOCYTESUR TRACE (A) 11/26/2022 1121    Radiological Exams on Admission: CT ABDOMEN PELVIS WO CONTRAST  Result Date: 11/26/2022 CLINICAL DATA:  Abdominal pain, diarrhea, leukocytosis EXAM: CT ABDOMEN AND PELVIS WITHOUT CONTRAST TECHNIQUE: Multidetector CT imaging of the abdomen and pelvis was performed following the standard protocol without IV contrast. RADIATION DOSE REDUCTION: This exam was performed according to the departmental dose-optimization program which includes automated exposure control, adjustment of the mA and/or kV according to  patient size and/or use of iterative reconstruction technique. COMPARISON:  02/12/2022 FINDINGS: Lower chest: Breathing motion limits evaluation of lower lung fields. As far as seen, there is no consolidation in the lower lung fields. Coronary artery calcifications are seen. Hepatobiliary: Surgical clips are seen in gallbladder fossa. There is no dilation of bile ducts. Pancreas: No focal abnormalities are seen. Spleen: Motion artifacts limit evaluation.  Spleen is not enlarged. Adrenals/Urinary Tract: Adrenals are unremarkable. There is no hydronephrosis. There is interval removal of bilateral ureteral stents.  There are no demonstrable renal or ureteral stones. There is 3.5 cm fluid density lesion in the upper pole of left kidney with no interval change suggesting renal cyst. Stomach/Bowel: Small hiatal hernia is seen. Stomach is not distended. Small bowel loops are not dilated. There is fluid in the lumen of small bowel loops. There is fluid in the lumen of colon and rectum. Appendix is not dilated. There is no wall thickening in colon. There is no pericolic stranding. Multiple diverticula seen in left colon without signs of focal acute diverticulitis. Vascular/Lymphatic: Scattered arterial calcifications are seen. Reproductive: Uterus is not seen. Other: There is no ascites or pneumoperitoneum. Small umbilical hernia containing fat is seen. Musculoskeletal: Degenerative changes are noted with spinal stenosis and encroachment of neural foramina in lumbar spine. There is minimal anterolisthesis at L5-S1 level. There is mild decrease in height of the upper endplates of bodies of L2 and L3 vertebrae with no significant interval change. IMPRESSION: There is fluid in the lumen of small bowel loops and colon suggesting possible nonspecific enterocolitis. There is no significant bowel wall thickening. There is no evidence of intestinal obstruction or pneumoperitoneum. There is no hydronephrosis. Appendix is not dilated.  Coronary artery disease. Aortic atherosclerosis. Left renal cyst. Small hiatal hernia. Diverticulosis of colon without signs of focal acute diverticulitis. Lumbar spondylosis. Electronically Signed   By: Ernie Avena M.D.   On: 11/26/2022 13:00    Assessment/Plan Principal Problem:   Enterocolitis Active Problems:   Abnormality of gait   Gastroesophageal reflux disease without esophagitis   AKI (acute kidney injury) (HCC)   Hypothyroidism   Essential hypertension   DOE (dyspnea on exertion)   Dehydration   Diarrhea   Leukocytosis   Type 2 diabetes mellitus with hyperglycemia (HCC)   Metabolic acidosis   Acute Diarrheal Illness Enterocolitis  Suspected infectious Diarrhea  -Patient is severely symptomatic and presents with electrolyte abnormalities and acute kidney injury -Continue IV fluid hydration for aggressive volume repletion -Enteric precautions as ordered -Check stool for C. difficile and GI pathogen panel -Because patient has had diarrhea for more than 3 days we are starting antibiotic therapy with IV ciprofloxacin and metronidazole. -Clear liquid diet as tolerated -IV nausea medications ordered -IV pantoprazole ordered  Acute kidney injury -Secondary to severe dehydration -IV fluids ordered -Renally dose medications as appropriate -Recheck renal function panel in a.m.  Leukocytosis -Secondary to enterocolitis -Treating as noted above -Recheck CBC with differential in a.m.  Metabolic acidosis -Likely secondary to diarrheal illness -Patient also reports that she has been taking metformin in setting of AKI -Continue IV fluid hydration as ordered -Sodium bicarbonate infusion ordered -Recheck basic metabolic panel in a.m.  Type 2 diabetes mellitus with hyperglycemia and neurological complications -Holding home metformin especially in setting of AKI and metabolic acidosis -SSI coverage ordered and frequent CBG monitoring ordered -No concentrated sweets or  fruit juices except to treat a low blood sugar -follow up A1c  Essential hypertension -Reduced dose of home diltiazem in setting of soft BPs -Follow  Hypothyroidism -Resume home levothyroxine every morning before breakfast   DVT prophylaxis: enoxaparin   Code Status: Full   Family Communication:   Disposition Plan: anticipate home when medically stable   Consults called:   Admission status: INP  Level of care: Med-Surg Standley Dakins MD [facility] How to contact the [facility] Attending or Consulting provider 7A - 7P or covering provider during after hours 7P -7A, for this patient?  Check the care team in [facility] and look  for a) attending/consulting [facility] provider listed and b) the [facility] team listed Log into [email or web address] and use [place] [facility] universal password to access. If you do not have the password, please contact the hospital operator. Locate the [facility] provider you are looking for under [facility] and page to a number that you can be directly reached. If you still have difficulty reaching the provider, please page the [facility] (Director on Call) for the Hospitalists listed on amion for assistance.   If 7PM-7AM, please contact night-coverage [email or web address] Password [email or web address]  11/26/2022, 2:10 PM

## 2022-11-26 NOTE — ED Notes (Signed)
Report given to Tera LPN

## 2022-11-27 DIAGNOSIS — N179 Acute kidney failure, unspecified: Secondary | ICD-10-CM

## 2022-11-27 DIAGNOSIS — E860 Dehydration: Secondary | ICD-10-CM | POA: Diagnosis not present

## 2022-11-27 DIAGNOSIS — A0472 Enterocolitis due to Clostridium difficile, not specified as recurrent: Secondary | ICD-10-CM | POA: Diagnosis present

## 2022-11-27 DIAGNOSIS — E872 Acidosis, unspecified: Secondary | ICD-10-CM | POA: Diagnosis not present

## 2022-11-27 DIAGNOSIS — E1165 Type 2 diabetes mellitus with hyperglycemia: Secondary | ICD-10-CM | POA: Diagnosis not present

## 2022-11-27 LAB — CBC WITH DIFFERENTIAL/PLATELET
Abs Immature Granulocytes: 0.1 10*3/uL — ABNORMAL HIGH (ref 0.00–0.07)
Basophils Absolute: 0.1 10*3/uL (ref 0.0–0.1)
Basophils Relative: 1 %
Eosinophils Absolute: 0.3 10*3/uL (ref 0.0–0.5)
Eosinophils Relative: 2 %
HCT: 36.7 % (ref 36.0–46.0)
Hemoglobin: 12 g/dL (ref 12.0–15.0)
Immature Granulocytes: 1 %
Lymphocytes Relative: 21 %
Lymphs Abs: 2.6 10*3/uL (ref 0.7–4.0)
MCH: 29.3 pg (ref 26.0–34.0)
MCHC: 32.7 g/dL (ref 30.0–36.0)
MCV: 89.7 fL (ref 80.0–100.0)
Monocytes Absolute: 1.1 10*3/uL — ABNORMAL HIGH (ref 0.1–1.0)
Monocytes Relative: 9 %
Neutro Abs: 8.2 10*3/uL — ABNORMAL HIGH (ref 1.7–7.7)
Neutrophils Relative %: 66 %
Platelets: 234 10*3/uL (ref 150–400)
RBC: 4.09 MIL/uL (ref 3.87–5.11)
RDW: 16 % — ABNORMAL HIGH (ref 11.5–15.5)
WBC: 12.3 10*3/uL — ABNORMAL HIGH (ref 4.0–10.5)
nRBC: 0 % (ref 0.0–0.2)

## 2022-11-27 LAB — GLUCOSE, CAPILLARY
Glucose-Capillary: 211 mg/dL — ABNORMAL HIGH (ref 70–99)
Glucose-Capillary: 159 mg/dL — ABNORMAL HIGH (ref 70–99)
Glucose-Capillary: 266 mg/dL — ABNORMAL HIGH (ref 70–99)
Glucose-Capillary: 208 mg/dL — ABNORMAL HIGH (ref 70–99)
Glucose-Capillary: 226 mg/dL — ABNORMAL HIGH (ref 70–99)

## 2022-11-27 LAB — GASTROINTESTINAL PANEL BY PCR, STOOL (REPLACES STOOL CULTURE)

## 2022-11-27 LAB — MAGNESIUM: Magnesium: 1.9 mg/dL (ref 1.7–2.4)

## 2022-11-27 LAB — BASIC METABOLIC PANEL
Anion gap: 11 (ref 5–15)
BUN: 32 mg/dL — ABNORMAL HIGH (ref 8–23)
CO2: 19 mmol/L — ABNORMAL LOW (ref 22–32)
Calcium: 8.3 mg/dL — ABNORMAL LOW (ref 8.9–10.3)
Chloride: 106 mmol/L (ref 98–111)
Creatinine, Ser: 1.37 mg/dL — ABNORMAL HIGH (ref 0.44–1.00)
GFR, Estimated: 41 mL/min — ABNORMAL LOW (ref >60)
Glucose, Bld: 195 mg/dL — ABNORMAL HIGH (ref 70–99)
Potassium: 3.3 mmol/L — ABNORMAL LOW (ref 3.5–5.1)
Sodium: 136 mmol/L (ref 135–145)

## 2022-11-27 LAB — CLOSTRIDIUM DIFFICILE BY PCR, REFLEXED: Toxigenic C. Difficile by PCR: POSITIVE — AB

## 2022-11-27 MED ORDER — VANCOMYCIN HCL 125 MG PO CAPS
125.0000 mg | ORAL_CAPSULE | Freq: Four times a day (QID) | ORAL | Status: DC
  Administered 2022-11-27 – 2022-11-28 (×4): 125 mg via ORAL
  Filled 2022-11-27 (×13): qty 1

## 2022-11-27 MED ORDER — SACCHAROMYCES BOULARDII 250 MG PO CAPS
250.0000 mg | ORAL_CAPSULE | Freq: Two times a day (BID) | ORAL | Status: DC
  Administered 2022-11-27 – 2022-11-28 (×3): 250 mg via ORAL
  Filled 2022-11-27 (×4): qty 1

## 2022-11-27 MED ORDER — PANTOPRAZOLE SODIUM 40 MG PO TBEC
40.0000 mg | DELAYED_RELEASE_TABLET | Freq: Every day | ORAL | Status: DC
  Administered 2022-11-28: 40 mg via ORAL
  Filled 2022-11-27: qty 1

## 2022-11-27 MED ORDER — CIPROFLOXACIN IN D5W 400 MG/200ML IV SOLN
400.0000 mg | Freq: Two times a day (BID) | INTRAVENOUS | Status: DC
  Administered 2022-11-27: 400 mg via INTRAVENOUS
  Filled 2022-11-27: qty 200

## 2022-11-27 MED ORDER — ENOXAPARIN SODIUM 40 MG/0.4ML IJ SOSY
40.0000 mg | PREFILLED_SYRINGE | INTRAMUSCULAR | Status: DC
  Administered 2022-11-27: 40 mg via SUBCUTANEOUS
  Filled 2022-11-27: qty 0.4

## 2022-11-27 MED ORDER — POTASSIUM CHLORIDE CRYS ER 20 MEQ PO TBCR
40.0000 meq | EXTENDED_RELEASE_TABLET | Freq: Once | ORAL | Status: AC
  Administered 2022-11-27: 40 meq via ORAL
  Filled 2022-11-27: qty 2

## 2022-11-27 NOTE — Progress Notes (Signed)
11/27/2022 2:36 PM  Update: C diff testing.  C diff toxin PCR positive   Start treatment for C diff enteritis.  DC IV cipro and metronidazole, starting oral vancomycin.   Continue enteric precautions and other supportive measures. Added probiotics.   Maryln Manuel, MD  How to contact the [facility] Attending or Consulting provider 7A - 7P or covering provider during after hours 7P -7A, for this patient?  Check the care team in [facility] and look for a) attending/consulting [facility] provider listed and b) the [facility] team listed Log into [email or web address] and use [place] universal password to access. If you do not have the password, please contact the hospital operator. Locate the [facility] provider you are looking for under [facility] and page to a number that you can be directly reached. If you still have difficulty reaching the provider, please page the [facility] (Director on Call) for the Hospitalists listed on amion for assistance.

## 2022-11-27 NOTE — Progress Notes (Signed)
PROGRESS NOTE   Patricia [name]  JXB:[id number] DOB: 02-16-1951 DOA: 11/26/2022 PCP: Assunta Found, MD   Chief Complaint  Patient presents with   Diarrhea   Level of care: Med-Surg  Brief Admission History:  72 year old female with type 2 diabetes mellitus, hypothyroidism, hypertension, osteoarthritis, RLS, postsurgical hypothyroidism, hyperlipidemia, GERD, history of nephrolithiasis, depression, diabetic neuropathy, overactive bladder who presented to the emergency department after suffering with severe ongoing diarrhea for the past 3 to 4 days.  She reports that she ate something from [place] that made her ill and she tried to give it to her dog but the dog would not eat it.  Patient has had nausea and poor oral intake over the past 2 to 3 days.  She denies any sick contacts or recent travel outside of the country.  She denies having abdominal pain.  She denies emesis.  She denies chest pain.  She denies fever.  She reports that her diarrhea has been watery and nonbloody.  It has been frequent over the past 3 days.  She is not able to eat or drink without having diarrhea.  She has noticed some distention in her abdomen.  In the ED she was noted to have acute kidney injury and metabolic acidosis.  She was sent for CT of the abdomen with findings suggesting enterocolitis.  She is being admitted into the hospital for further management.   Assessment and Plan:  Acute Diarrheal Illness Enterocolitis  Suspected infectious Diarrhea  -Patient is severely symptomatic and presents with electrolyte abnormalities and acute kidney injury -Continue IV fluid hydration for aggressive volume repletion -Enteric precautions as ordered -Check stool for C. difficile and GI pathogen panel (still pending) -Because patient has had diarrhea for more than 3 days we are starting antibiotic therapy with IV ciprofloxacin and metronidazole. -Clear liquid diet tolerated, advance to soft diet  -IV nausea medications  ordered -IV pantoprazole ordered, changed to p.o.    Acute kidney injury - improving  -Secondary to severe dehydration -IV fluids ordered -Renally dose medications as appropriate -Recheck renal function panel in a.m.   Leukocytosis - improving  -Secondary to enterocolitis -Treating as noted above -WBC trending down from 18 to 12.3 -Recheck CBC with differential in a.m.   Metabolic acidosis - improving -Likely secondary to diarrheal illness -Patient also reports that she has been taking metformin in setting of AKI -Continue IV fluid hydration as ordered -Sodium bicarbonate infusion ordered and CO2 improved from 13 to 19 -Recheck basic metabolic panel in a.m.   Type 2 diabetes mellitus with hyperglycemia and neurological complications -Holding home metformin especially in setting of AKI and metabolic acidosis -SSI coverage ordered and frequent CBG monitoring ordered -No concentrated sweets or fruit juices except to treat a low blood sugar -follow up A1c - 7.2% suboptimally controlled disease    Essential hypertension -Reduced dose of home diltiazem in setting of soft BPs -Follow   Hypothyroidism -Resumed home levothyroxine every morning before breakfast   DVT prophylaxis: enoxaparin Code Status: Full  Family Communication:  Disposition: Status is: Inpatient Remains inpatient appropriate because: IV antibiotics    Consultants:   Procedures:   Antimicrobials:  Ciprofloxacin 6/8>> Metronidazole 6/8>>   Subjective: Pt says she is feeling better today THAN yesterday, had a large watery bowel movement this morning.  No fever or chills.    Objective: Vitals:   11/26/22 1824 11/26/22 2146 11/27/22 0224 11/27/22 0549  BP: 128/67 134/72 129/76 139/69  Pulse: 86 65 (!) 58 64  Resp:  20 16 16 16   Temp: 97.7 F (36.5 C) 97.7 F (36.5 C) 97.8 F (36.6 C) (!) 97.5 F (36.4 C)  TempSrc: Oral Oral Oral Oral  SpO2: 99% 100% 97% 98%  Weight:      Height:         Intake/Output Summary (Last 24 hours) at 11/27/2022 1139 Last data filed at 11/27/2022 0800 Gross per 24 hour  Intake 4242.63 ml  Output 100 ml  Net 4142.63 ml   Filed Weights   11/26/22 1055 11/26/22 1406  Weight: 83.5 kg 89.7 kg   Examination:  General exam: Appears calm and comfortable  Respiratory system: Clear to auscultation. Respiratory effort normal. Cardiovascular system: normal S1 & S2 heard. No JVD, murmurs, rubs, gallops or clicks. No pedal edema. Gastrointestinal system: Abdomen is nondistended, soft and nontender. No organomegaly or masses felt. Normal bowel sounds heard. Central nervous system: Alert and oriented. No focal neurological deficits. Extremities: Symmetric 5 x 5 power. Skin: No rashes, lesions or ulcers. Psychiatry: Judgement and insight appear normal. Mood & affect appropriate.   Data Reviewed: I have personally reviewed following labs and imaging studies  CBC: Recent Labs  Lab 11/26/22 1125 11/26/22 1205 11/27/22 0357  WBC 18.5*  --  12.3*  NEUTROABS  --  13.5* 8.2*  HGB 15.0  --  12.0  HCT 46.1*  --  36.7  MCV 91.5  --  89.7  PLT 339  --  234    Basic Metabolic Panel: Recent Labs  Lab 11/26/22 1125 11/27/22 0357  NA 132* 136  K 5.1 3.3*  CL 106 106  CO2 13* 19*  GLUCOSE 234* 195*  BUN 41* 32*  CREATININE 2.30* 1.37*  CALCIUM 10.0 8.3*  MG  --  1.9    CBG: Recent Labs  Lab 11/26/22 1612 11/26/22 2143 11/27/22 0232 11/27/22 0716 11/27/22 1109  GLUCAP 227* 169* 211* 159* 266*    Recent Results (from the past 240 hour(s))  C Difficile Quick Screen w PCR reflex     Status: Abnormal   Collection Time: 11/26/22  9:30 PM   Specimen: STOOL  Result Value Ref Range Status   C Diff antigen POSITIVE (A) NEGATIVE Final   C Diff toxin NEGATIVE NEGATIVE Final   C Diff interpretation Results are indeterminate. See PCR results.  Final    Comment: Performed at [facility], [address], [place], [place] [address]     Radiology  Studies: CT ABDOMEN PELVIS WO CONTRAST  Result Date: 11/26/2022 CLINICAL DATA:  Abdominal pain, diarrhea, leukocytosis EXAM: CT ABDOMEN AND PELVIS WITHOUT CONTRAST TECHNIQUE: Multidetector CT imaging of the abdomen and pelvis was performed following the standard protocol without IV contrast. RADIATION DOSE REDUCTION: This exam was performed according to the departmental dose-optimization program which includes automated exposure control, adjustment of the mA and/or kV according to patient size and/or use of iterative reconstruction technique. COMPARISON:  02/12/2022 FINDINGS: Lower chest: Breathing motion limits evaluation of lower lung fields. As far as seen, there is no consolidation in the lower lung fields. Coronary artery calcifications are seen. Hepatobiliary: Surgical clips are seen in gallbladder fossa. There is no dilation of bile ducts. Pancreas: No focal abnormalities are seen. Spleen: Motion artifacts limit evaluation.  Spleen is not enlarged. Adrenals/Urinary Tract: Adrenals are unremarkable. There is no hydronephrosis. There is interval removal of bilateral ureteral stents. There are no demonstrable renal or ureteral stones. There is 3.5 cm fluid density lesion in the upper pole of left kidney with no interval  change suggesting renal cyst. Stomach/Bowel: Small hiatal hernia is seen. Stomach is not distended. Small bowel loops are not dilated. There is fluid in the lumen of small bowel loops. There is fluid in the lumen of colon and rectum. Appendix is not dilated. There is no wall thickening in colon. There is no pericolic stranding. Multiple diverticula seen in left colon without signs of focal acute diverticulitis. Vascular/Lymphatic: Scattered arterial calcifications are seen. Reproductive: Uterus is not seen. Other: There is no ascites or pneumoperitoneum. Small umbilical hernia containing fat is seen. Musculoskeletal: Degenerative changes are noted with spinal stenosis and encroachment of neural  foramina in lumbar spine. There is minimal anterolisthesis at L5-S1 level. There is mild decrease in height of the upper endplates of bodies of L2 and L3 vertebrae with no significant interval change. IMPRESSION: There is fluid in the lumen of small bowel loops and colon suggesting possible nonspecific enterocolitis. There is no significant bowel wall thickening. There is no evidence of intestinal obstruction or pneumoperitoneum. There is no hydronephrosis. Appendix is not dilated. Coronary artery disease. Aortic atherosclerosis. Left renal cyst. Small hiatal hernia. Diverticulosis of colon without signs of focal acute diverticulitis. Lumbar spondylosis. Electronically Signed   By: Ernie Avena M.D.   On: 11/26/2022 13:00    Scheduled Meds:  diltiazem  120 mg Oral Daily   enoxaparin (LOVENOX) injection  40 mg Subcutaneous Q24H   escitalopram  5 mg Oral Daily   feeding supplement  1 Container Oral TID BM   insulin aspart  0-9 Units Subcutaneous TID WC   insulin aspart  3 Units Subcutaneous TID WC   levothyroxine  175 mcg Oral Daily   mirabegron ER  50 mg Oral Daily   pantoprazole (PROTONIX) IV  40 mg Intravenous Daily   Continuous Infusions:  ciprofloxacin     metronidazole Stopped (11/27/22 0432)   sodium bicarbonate 150 mEq in sterile water 1,150 mL infusion 130 mL/hr at 11/27/22 0739     LOS: 1 day   Time spent: 36 mins  Emil Weigold Laural Benes, MD How to contact the [facility] Attending or Consulting provider 7A - 7P or covering provider during after hours 7P -7A, for this patient?  Check the care team in [facility] and look for a) attending/consulting [facility] provider listed and b) the [facility] team listed Log into [email or web address] and use [facility]'s universal password to access. If you do not have the password, please contact the hospital operator. Locate the [facility] provider you are looking for under [facility] and page to a number that you can be directly reached. If you still have difficulty  reaching the provider, please page the [facility] (Director on Call) for the Hospitalists listed on amion for assistance.  11/27/2022, 11:39 AM

## 2022-11-28 ENCOUNTER — Other Ambulatory Visit (HOSPITAL_COMMUNITY): Payer: Self-pay

## 2022-11-28 DIAGNOSIS — A0472 Enterocolitis due to Clostridium difficile, not specified as recurrent: Secondary | ICD-10-CM | POA: Diagnosis not present

## 2022-11-28 DIAGNOSIS — E860 Dehydration: Secondary | ICD-10-CM | POA: Diagnosis not present

## 2022-11-28 DIAGNOSIS — N179 Acute kidney failure, unspecified: Secondary | ICD-10-CM | POA: Diagnosis not present

## 2022-11-28 DIAGNOSIS — I10 Essential (primary) hypertension: Secondary | ICD-10-CM

## 2022-11-28 LAB — CBC WITH DIFFERENTIAL/PLATELET
Abs Immature Granulocytes: 0.06 10*3/uL (ref 0.00–0.07)
Basophils Absolute: 0.1 10*3/uL (ref 0.0–0.1)
Basophils Relative: 1 %
Eosinophils Absolute: 0.3 10*3/uL (ref 0.0–0.5)
Eosinophils Relative: 4 %
HCT: 35.7 % — ABNORMAL LOW (ref 36.0–46.0)
Hemoglobin: 12.1 g/dL (ref 12.0–15.0)
Immature Granulocytes: 1 %
Lymphocytes Relative: 35 %
Lymphs Abs: 3 10*3/uL (ref 0.7–4.0)
MCH: 29.7 pg (ref 26.0–34.0)
MCHC: 33.9 g/dL (ref 30.0–36.0)
MCV: 87.7 fL (ref 80.0–100.0)
Monocytes Absolute: 0.9 10*3/uL (ref 0.1–1.0)
Monocytes Relative: 10 %
Neutro Abs: 4.2 10*3/uL (ref 1.7–7.7)
Neutrophils Relative %: 49 %
Platelets: 230 10*3/uL (ref 150–400)
RBC: 4.07 MIL/uL (ref 3.87–5.11)
RDW: 16 % — ABNORMAL HIGH (ref 11.5–15.5)
WBC: 8.5 10*3/uL (ref 4.0–10.5)
nRBC: 0 % (ref 0.0–0.2)

## 2022-11-28 LAB — BASIC METABOLIC PANEL
Anion gap: 8 (ref 5–15)
BUN: 19 mg/dL (ref 8–23)
CO2: 29 mmol/L (ref 22–32)
Calcium: 8.5 mg/dL — ABNORMAL LOW (ref 8.9–10.3)
Chloride: 103 mmol/L (ref 98–111)
Creatinine, Ser: 0.9 mg/dL (ref 0.44–1.00)
GFR, Estimated: >60 mL/min (ref >60)
Glucose, Bld: 214 mg/dL — ABNORMAL HIGH (ref 70–99)
Potassium: 3.4 mmol/L — ABNORMAL LOW (ref 3.5–5.1)
Sodium: 140 mmol/L (ref 135–145)

## 2022-11-28 LAB — GLUCOSE, CAPILLARY
Glucose-Capillary: 171 mg/dL — ABNORMAL HIGH (ref 70–99)
Glucose-Capillary: 192 mg/dL — ABNORMAL HIGH (ref 70–99)
Glucose-Capillary: 221 mg/dL — ABNORMAL HIGH (ref 70–99)

## 2022-11-28 LAB — MAGNESIUM: Magnesium: 1.8 mg/dL (ref 1.7–2.4)

## 2022-11-28 MED ORDER — POTASSIUM CHLORIDE CRYS ER 20 MEQ PO TBCR
40.0000 meq | EXTENDED_RELEASE_TABLET | Freq: Once | ORAL | Status: AC
  Administered 2022-11-28: 40 meq via ORAL
  Filled 2022-11-28: qty 2

## 2022-11-28 MED ORDER — VANCOMYCIN HCL 125 MG PO CAPS: 125.0000 mg | ORAL_CAPSULE | Freq: Four times a day (QID) | ORAL | 0 refills | Status: AC

## 2022-11-28 MED ORDER — SACCHAROMYCES BOULARDII 250 MG PO CAPS: 250.0000 mg | ORAL_CAPSULE | Freq: Two times a day (BID) | ORAL | 0 refills | Status: AC

## 2022-11-28 MED ORDER — MAGNESIUM SULFATE 2 GM/50ML IV SOLN
2.0000 g | Freq: Once | INTRAVENOUS | Status: AC
  Administered 2022-11-28: 2 g via INTRAVENOUS
  Filled 2022-11-28: qty 50

## 2022-11-28 NOTE — Care Management Important Message (Signed)
Important Message  Patient Details  Name: DAYLAH SAYAVONG MRN: [id number] Date of Birth: 1950/09/10   Medicare Important Message Given:  N/A - LOS <3 / Initial given by admissions     Corey Harold 11/28/2022, 10:18 AM

## 2022-11-28 NOTE — Progress Notes (Signed)
Patient has done well overnight, no acute events this shift.

## 2022-11-28 NOTE — Discharge Summary (Signed)
Physician Discharge Summary  Patricia [name] ZOX:[id number] DOB: [birth year] DOA: 11/26/2022  PCP: Assunta Found, MD  Admit date: 11/26/2022 Discharge date: 11/28/2022  Admitted From:  HOME  Disposition: HOME   Recommendations for Outpatient Follow-up:  Follow up with PCP in 2 weeks  Home Health: n/a   Discharge Condition: STABLE   CODE STATUS: FULL DIET: bland, soft foods for next week, then advance as tolerated    Brief Hospitalization Summary: Please see all hospital notes, images, labs for full details of the hospitalization. ADMISSION HPI: 72 year old female with type 2 diabetes mellitus, hypothyroidism, hypertension, osteoarthritis, RLS, postsurgical hypothyroidism, hyperlipidemia, GERD, history of nephrolithiasis, depression, diabetic neuropathy, overactive bladder who presented to the emergency department after suffering with severe ongoing diarrhea for the past 3 to 4 days.  She reports that she ate something from [place] that made her ill and she tried to give it to her dog but the dog would not eat it.  Patient has had nausea and poor oral intake over the past 2 to 3 days.  She denies any sick contacts or recent travel outside of the country.  She denies having abdominal pain.  She denies emesis.  She denies chest pain.  She denies fever.  She reports that her diarrhea has been watery and nonbloody.  It has been frequent over the past 3 days.  She is not able to eat or drink without having diarrhea.  She has noticed some distention in her abdomen.  In the ED she was noted to have acute kidney injury and metabolic acidosis.  She was sent for CT of the abdomen with findings suggesting enterocolitis.  She is being admitted into the hospital for further management.  HOSPITAL COURSE BY PROBLEM   Acute Diarrheal Illness Enterocolitis  C DIFFICILE ENTEROCOLITIS AND Diarrhea  -Patient was severely symptomatic and presented with electrolyte abnormalities and acute kidney injury -treated  with IV fluid hydration for aggressive volume repletion -Enteric precautions as ordered -Stool for C. difficile PCR POSITIVE  -pt was started on oral vancomycin x 10 day course on 11/27/22  -Clear liquid diet tolerated, advanced to soft diet  -pt is clinically much improved and labs are improved and reassuring -pt wants to discharge home today -DC home to complete full 10 day course of oral vancomycin.    Acute kidney injury - resolved  -Secondary to severe dehydration -IV fluids ordered and given   Leukocytosis - resolved  -Secondary to C diff enterocolitis -Treating as noted above   Metabolic acidosis - treated and resolved  -Likely secondary to diarrheal illness -Patient also reports that she has been taking metformin in setting of AKI -Sodium bicarbonate infusion ordered and CO2 improved from 13 to 29   Type 2 diabetes mellitus with hyperglycemia and neurological complications -Holding home metformin especially in setting of AKI and metabolic acidosis -SSI coverage ordered and frequent CBG monitoring ordered -No concentrated sweets or fruit juices except to treat a low blood sugar -follow up A1c - 7.2% suboptimally controlled disease  -resume home treatments at discharge and follow up with PCP   Essential hypertension -Resume home treatments at discharge -soft BPs have rebounded after pt has been hydrated    Hypothyroidism -Resumed home levothyroxine every morning before breakfast   Discharge Diagnoses:  Principal Problem:   Enteritis due to Clostridium difficile Active Problems:   Enterocolitis   Abnormality of gait   Gastroesophageal reflux disease without esophagitis   AKI (acute kidney injury) (HCC)   Hypothyroidism  Essential hypertension   DOE (dyspnea on exertion)   Dehydration   Diarrhea   Leukocytosis   Type 2 diabetes mellitus with hyperglycemia (HCC)   Metabolic acidosis   Discharge Instructions:  Allergies as of 11/28/2022       Reactions    Amlodipine Swelling   Keflex [cephalexin] Rash        Medication List     TAKE these medications    albuterol 108 (90 Base) MCG/ACT inhaler Commonly known as: VENTOLIN HFA Inhale 2 puffs into the lungs every 4 (four) hours as needed for shortness of breath.   APPLE CIDER VINEGAR PO Take 1 tablet by mouth daily.   cetirizine 10 MG tablet Commonly known as: ZYRTEC Take 10 mg by mouth at bedtime.   CINNAMON PO Take 1,000 mg by mouth daily.   cyclobenzaprine 5 MG tablet Commonly known as: FLEXERIL Take 1 tablet (5 mg total) by mouth 3 (three) times daily as needed for muscle spasms.   escitalopram 5 MG tablet Commonly known as: LEXAPRO Take 5 mg by mouth daily.   famotidine 20 MG tablet Commonly known as: Pepcid One after supper What changed:  how much to take how to take this when to take this   Fish Oil 1000 MG Caps Take 1 capsule by mouth daily.   levothyroxine 175 MCG tablet Commonly known as: SYNTHROID Take 175 mcg by mouth daily.   Melatonin 10 MG Caps Take 10 mg by mouth at bedtime as needed (sleep).   meloxicam 15 MG tablet Commonly known as: MOBIC Take 15 mg by mouth as needed for pain.   metFORMIN 500 MG tablet Commonly known as: GLUCOPHAGE Take 500 mg by mouth 2 (two) times daily.   mirabegron ER 50 MG Tb24 tablet Commonly known as: MYRBETRIQ Take 1 tablet (50 mg total) by mouth daily.   pantoprazole 40 MG tablet Commonly known as: Protonix Take 1 tablet (40 mg total) by mouth daily. Take 30-60 min before first meal of the day   pregabalin 300 MG capsule Commonly known as: LYRICA Take 300 mg by mouth 2 (two) times daily.   saccharomyces boulardii 250 MG capsule Commonly known as: FLORASTOR Take 1 capsule (250 mg total) by mouth 2 (two) times daily.   simvastatin 20 MG tablet Commonly known as: ZOCOR Take 20 mg by mouth daily.   Tiadylt ER 360 MG 24 hr capsule Generic drug: diltiazem Take 360 mg by mouth daily.   vancomycin 125  MG capsule Commonly known as: VANCOCIN Take 1 capsule (125 mg total) by mouth 4 (four) times daily for 9 days.        Follow-up Information     Assunta Found, MD. Schedule an appointment as soon as possible for a visit in 2 week(s).   Specialty: Family Medicine Why: Hospital Follow Up Contact information: [address] [place] [address] [phone]                Allergies  Allergen Reactions   Amlodipine Swelling   Keflex [Cephalexin] Rash   Allergies as of 11/28/2022       Reactions   Amlodipine Swelling   Keflex [cephalexin] Rash        Medication List     TAKE these medications    albuterol 108 (90 Base) MCG/ACT inhaler Commonly known as: VENTOLIN HFA Inhale 2 puffs into the lungs every 4 (four) hours as needed for shortness of breath.   APPLE CIDER VINEGAR PO Take 1 tablet by mouth  daily.   cetirizine 10 MG tablet Commonly known as: ZYRTEC Take 10 mg by mouth at bedtime.   CINNAMON PO Take 1,000 mg by mouth daily.   cyclobenzaprine 5 MG tablet Commonly known as: FLEXERIL Take 1 tablet (5 mg total) by mouth 3 (three) times daily as needed for muscle spasms.   escitalopram 5 MG tablet Commonly known as: LEXAPRO Take 5 mg by mouth daily.   famotidine 20 MG tablet Commonly known as: Pepcid One after supper What changed:  how much to take how to take this when to take this   Fish Oil 1000 MG Caps Take 1 capsule by mouth daily.   levothyroxine 175 MCG tablet Commonly known as: SYNTHROID Take 175 mcg by mouth daily.   Melatonin 10 MG Caps Take 10 mg by mouth at bedtime as needed (sleep).   meloxicam 15 MG tablet Commonly known as: MOBIC Take 15 mg by mouth as needed for pain.   metFORMIN 500 MG tablet Commonly known as: GLUCOPHAGE Take 500 mg by mouth 2 (two) times daily.   mirabegron ER 50 MG Tb24 tablet Commonly known as: MYRBETRIQ Take 1 tablet (50 mg total) by mouth daily.   pantoprazole 40 MG  tablet Commonly known as: Protonix Take 1 tablet (40 mg total) by mouth daily. Take 30-60 min before first meal of the day   pregabalin 300 MG capsule Commonly known as: LYRICA Take 300 mg by mouth 2 (two) times daily.   saccharomyces boulardii 250 MG capsule Commonly known as: FLORASTOR Take 1 capsule (250 mg total) by mouth 2 (two) times daily.   simvastatin 20 MG tablet Commonly known as: ZOCOR Take 20 mg by mouth daily.   Tiadylt ER 360 MG 24 hr capsule Generic drug: diltiazem Take 360 mg by mouth daily.   vancomycin 125 MG capsule Commonly known as: VANCOCIN Take 1 capsule (125 mg total) by mouth 4 (four) times daily for 9 days.        Procedures/Studies: CT ABDOMEN PELVIS WO CONTRAST  Result Date: 11/26/2022 CLINICAL DATA:  Abdominal pain, diarrhea, leukocytosis EXAM: CT ABDOMEN AND PELVIS WITHOUT CONTRAST TECHNIQUE: Multidetector CT imaging of the abdomen and pelvis was performed following the standard protocol without IV contrast. RADIATION DOSE REDUCTION: This exam was performed according to the departmental dose-optimization program which includes automated exposure control, adjustment of the mA and/or kV according to patient size and/or use of iterative reconstruction technique. COMPARISON:  02/12/2022 FINDINGS: Lower chest: Breathing motion limits evaluation of lower lung fields. As far as seen, there is no consolidation in the lower lung fields. Coronary artery calcifications are seen. Hepatobiliary: Surgical clips are seen in gallbladder fossa. There is no dilation of bile ducts. Pancreas: No focal abnormalities are seen. Spleen: Motion artifacts limit evaluation.  Spleen is not enlarged. Adrenals/Urinary Tract: Adrenals are unremarkable. There is no hydronephrosis. There is interval removal of bilateral ureteral stents. There are no demonstrable renal or ureteral stones. There is 3.5 cm fluid density lesion in the upper pole of left kidney with no interval change  suggesting renal cyst. Stomach/Bowel: Small hiatal hernia is seen. Stomach is not distended. Small bowel loops are not dilated. There is fluid in the lumen of small bowel loops. There is fluid in the lumen of colon and rectum. Appendix is not dilated. There is no wall thickening in colon. There is no pericolic stranding. Multiple diverticula seen in left colon without signs of focal acute diverticulitis. Vascular/Lymphatic: Scattered arterial calcifications are seen. Reproductive: Uterus is not  seen. Other: There is no ascites or pneumoperitoneum. Small umbilical hernia containing fat is seen. Musculoskeletal: Degenerative changes are noted with spinal stenosis and encroachment of neural foramina in lumbar spine. There is minimal anterolisthesis at L5-S1 level. There is mild decrease in height of the upper endplates of bodies of L2 and L3 vertebrae with no significant interval change. IMPRESSION: There is fluid in the lumen of small bowel loops and colon suggesting possible nonspecific enterocolitis. There is no significant bowel wall thickening. There is no evidence of intestinal obstruction or pneumoperitoneum. There is no hydronephrosis. Appendix is not dilated. Coronary artery disease. Aortic atherosclerosis. Left renal cyst. Small hiatal hernia. Diverticulosis of colon without signs of focal acute diverticulitis. Lumbar spondylosis. Electronically Signed   By: Ernie Avena M.D.   On: 11/26/2022 13:00     Subjective: Pt reports that she is feeling so much better, tolerating diet and BMs have slowed down. She is eager to go home.  She says she will take the antibiotics to completion.    Discharge Exam: Vitals:   11/28/22 0448 11/28/22 0832  BP: (!) 145/74 (!) 156/78  Pulse: 62   Resp: 16   Temp: 97.8 F (36.6 C)   SpO2: 97%    Vitals:   11/27/22 1306 11/27/22 2041 11/28/22 0448 11/28/22 0832  BP: 109/71 136/65 (!) 145/74 (!) 156/78  Pulse:  62 62   Resp: 18 18 16    Temp: (!) 97.5 F  (36.4 C) 98.4 F (36.9 C) 97.8 F (36.6 C)   TempSrc:  Oral Oral   SpO2: 99% 96% 97%   Weight:      Height:       General: Pt is alert, awake, not in acute distress Cardiovascular: normal S1/S2 +, no rubs, no gallops Respiratory: CTA bilaterally, no wheezing, no rhonchi Abdominal: Soft, NT, ND, bowel sounds + Extremities: no edema, no cyanosis   The results of significant diagnostics from this hospitalization (including imaging, microbiology, ancillary and laboratory) are listed below for reference.     Microbiology: Recent Results (from the past 240 hour(s))  C Difficile Quick Screen w PCR reflex     Status: Abnormal   Collection Time: 11/26/22  9:30 PM   Specimen: STOOL  Result Value Ref Range Status   C Diff antigen POSITIVE (A) NEGATIVE Final   C Diff toxin NEGATIVE NEGATIVE Final   C Diff interpretation Results are indeterminate. See PCR results.  Final    Comment: Performed at [facility], [address] [place], [place] [address]  Gastrointestinal Panel by PCR , Stool     Status: Abnormal   Collection Time: 11/26/22  9:30 PM   Specimen: STOOL  Result Value Ref Range Status   Campylobacter species NOT DETECTED NOT DETECTED Final   Plesimonas shigelloides NOT DETECTED NOT DETECTED Final   Salmonella species NOT DETECTED NOT DETECTED Final   Yersinia enterocolitica NOT DETECTED NOT DETECTED Final   Vibrio species NOT DETECTED NOT DETECTED Final   Vibrio cholerae NOT DETECTED NOT DETECTED Final   Enteroaggregative E coli (EAEC) NOT DETECTED NOT DETECTED Final   Enteropathogenic E coli (EPEC) DETECTED (A) NOT DETECTED Final    Comment: RESULT CALLED TO, READ BACK BY AND VERIFIED WITH:  CLAIRE THOMPSON 11/27/2022 2120 CP    Enterotoxigenic E coli (ETEC) DETECTED (A) NOT DETECTED Final    Comment: RESULT CALLED TO, READ BACK BY AND VERIFIED WITH:  CLAIRE THOMPSON 11/27/2022 2120 CP    Shiga like toxin producing E coli (STEC)  NOT DETECTED NOT DETECTED Final    Shigella/Enteroinvasive E coli (EIEC) NOT DETECTED NOT DETECTED Final   Cryptosporidium NOT DETECTED NOT DETECTED Final   Cyclospora cayetanensis NOT DETECTED NOT DETECTED Final   Entamoeba histolytica NOT DETECTED NOT DETECTED Final   Giardia lamblia NOT DETECTED NOT DETECTED Final   Adenovirus F40/41 NOT DETECTED NOT DETECTED Final   Astrovirus NOT DETECTED NOT DETECTED Final   Norovirus GI/GII NOT DETECTED NOT DETECTED Final   Rotavirus A NOT DETECTED NOT DETECTED Final   Sapovirus (I, II, IV, and V) NOT DETECTED NOT DETECTED Final    Comment: Performed at [facility], [address], [place], [place] [address]  C. Diff by PCR, Reflexed     Status: Abnormal   Collection Time: 11/26/22  9:30 PM  Result Value Ref Range Status   Toxigenic C. Difficile by PCR POSITIVE (A) NEGATIVE Final    Comment: Positive for toxigenic C. difficile with little to no toxin production. Only treat if clinical presentation suggests symptomatic illness. Performed at [facility], [address], [place], [place] [address]      Labs: BNP (last 3 results) Recent Labs    10/14/22 1446  BNP 5.0   Basic Metabolic Panel: Recent Labs  Lab 11/26/22 1125 11/27/22 0357 11/28/22 0645  NA 132* 136 140  K 5.1 3.3* 3.4*  CL 106 106 103  CO2 13* 19* 29  GLUCOSE 234* 195* 214*  BUN 41* 32* 19  CREATININE 2.30* 1.37* 0.90  CALCIUM 10.0 8.3* 8.5*  MG  --  1.9 1.8   Liver Function Tests: Recent Labs  Lab 11/26/22 1125  AST 28  ALT 37  ALKPHOS 103  BILITOT 0.9  PROT 8.7*  ALBUMIN 4.9   Recent Labs  Lab 11/26/22 1125  LIPASE 35   No results for input(s): "AMMONIA" in the last 168 hours. CBC: Recent Labs  Lab 11/26/22 1125 11/26/22 1205 11/27/22 0357 11/28/22 0645  WBC 18.5*  --  12.3* 8.5  NEUTROABS  --  13.5* 8.2* 4.2  HGB 15.0  --  12.0 12.1  HCT 46.1*  --  36.7 35.7*  MCV 91.5  --  89.7 87.7  PLT 339  --  234 230   Cardiac Enzymes: No results for input(s):  "CKTOTAL", "CKMB", "CKMBINDEX", "TROPONINI" in the last 168 hours. BNP: Invalid input(s): "POCBNP" CBG: Recent Labs  Lab 11/27/22 1606 11/27/22 2223 11/28/22 0314 11/28/22 0709 11/28/22 1105  GLUCAP 208* 226* 171* 192* 221*   D-Dimer No results for input(s): "DDIMER" in the last 72 hours. Hgb A1c Recent Labs    11/26/22 1125  HGBA1C 7.2*   Lipid Profile No results for input(s): "CHOL", "HDL", "LDLCALC", "TRIG", "CHOLHDL", "LDLDIRECT" in the last 72 hours. Thyroid function studies No results for input(s): "TSH", "T4TOTAL", "T3FREE", "THYROIDAB" in the last 72 hours.  Invalid input(s): "FREET3" Anemia work up No results for input(s): "VITAMINB12", "FOLATE", "FERRITIN", "TIBC", "IRON", "RETICCTPCT" in the last 72 hours. Urinalysis    Component Value Date/Time   COLORURINE AMBER (A) 11/26/2022 1121   APPEARANCEUR CLOUDY (A) 11/26/2022 1121   APPEARANCEUR Clear 09/14/2022 1407   LABSPEC 1.019 11/26/2022 1121   PHURINE 5.0 11/26/2022 1121   GLUCOSEU NEGATIVE 11/26/2022 1121   HGBUR NEGATIVE 11/26/2022 1121   BILIRUBINUR NEGATIVE 11/26/2022 1121   BILIRUBINUR Negative 09/14/2022 1407   KETONESUR NEGATIVE 11/26/2022 1121   PROTEINUR 100 (A) 11/26/2022 1121   UROBILINOGEN 0.2 08/06/2019 1043   UROBILINOGEN 0.2 08/12/2009 1550  NITRITE NEGATIVE 11/26/2022 1121   LEUKOCYTESUR TRACE (A) 11/26/2022 1121   Sepsis Labs Recent Labs  Lab 11/26/22 1125 11/27/22 0357 11/28/22 0645  WBC 18.5* 12.3* 8.5   Microbiology Recent Results (from the past 240 hour(s))  C Difficile Quick Screen w PCR reflex     Status: Abnormal   Collection Time: 11/26/22  9:30 PM   Specimen: STOOL  Result Value Ref Range Status   C Diff antigen POSITIVE (A) NEGATIVE Final   C Diff toxin NEGATIVE NEGATIVE Final   C Diff interpretation Results are indeterminate. See PCR results.  Final    Comment: Performed at [facility], [address] [place], [place] [address]  Gastrointestinal Panel by PCR ,  Stool     Status: Abnormal   Collection Time: 11/26/22  9:30 PM   Specimen: STOOL  Result Value Ref Range Status   Campylobacter species NOT DETECTED NOT DETECTED Final   Plesimonas shigelloides NOT DETECTED NOT DETECTED Final   Salmonella species NOT DETECTED NOT DETECTED Final   Yersinia enterocolitica NOT DETECTED NOT DETECTED Final   Vibrio species NOT DETECTED NOT DETECTED Final   Vibrio cholerae NOT DETECTED NOT DETECTED Final   Enteroaggregative E coli (EAEC) NOT DETECTED NOT DETECTED Final   Enteropathogenic E coli (EPEC) DETECTED (A) NOT DETECTED Final    Comment: RESULT CALLED TO, READ BACK BY AND VERIFIED WITH:  CLAIRE THOMPSON 11/27/2022 2120 CP    Enterotoxigenic E coli (ETEC) DETECTED (A) NOT DETECTED Final    Comment: RESULT CALLED TO, READ BACK BY AND VERIFIED WITH:  CLAIRE THOMPSON 11/27/2022 2120 CP    Shiga like toxin producing E coli (STEC) NOT DETECTED NOT DETECTED Final   Shigella/Enteroinvasive E coli (EIEC) NOT DETECTED NOT DETECTED Final   Cryptosporidium NOT DETECTED NOT DETECTED Final   Cyclospora cayetanensis NOT DETECTED NOT DETECTED Final   Entamoeba histolytica NOT DETECTED NOT DETECTED Final   Giardia lamblia NOT DETECTED NOT DETECTED Final   Adenovirus F40/41 NOT DETECTED NOT DETECTED Final   Astrovirus NOT DETECTED NOT DETECTED Final   Norovirus GI/GII NOT DETECTED NOT DETECTED Final   Rotavirus A NOT DETECTED NOT DETECTED Final   Sapovirus (I, II, IV, and V) NOT DETECTED NOT DETECTED Final    Comment: Performed at [facility], [address] [place], [place] [address]  C. Diff by PCR, Reflexed     Status: Abnormal   Collection Time: 11/26/22  9:30 PM  Result Value Ref Range Status   Toxigenic C. Difficile by PCR POSITIVE (A) NEGATIVE Final    Comment: Positive for toxigenic C. difficile with little to no toxin production. Only treat if clinical presentation suggests symptomatic illness. Performed at [facility], [address] [place], [place] [address]    Time coordinating discharge: 40 mins  SIGNED:  Standley Dakins, MD  [facility] 11/28/2022, 11:59 AM How to contact the [facility] Attending or Consulting provider 7A - 7P or covering provider during after hours 7P -7A, for this patient?  Check the care team in [facility] and look for a) attending/consulting [facility] provider listed and b) the [facility] team listed Log into [email or web address] and use [facility] universal password to access. If you do not have the password, please contact the hospital operator. Locate the [facility] provider you are looking for under [facility] and page to a number that you can be directly reached. If you still have difficulty reaching the provider, please page the [facility] (Director on Call) for the  Hospitalists listed on amion for assistance.

## 2022-11-28 NOTE — Discharge Instructions (Signed)

## 2022-11-28 NOTE — TOC CM/SW Note (Signed)
Transition of Care [facility]) - Inpatient Brief Assessment   Patient Details  Name: Patricia [name] MRN: [id number] Date of Birth: January 04, 1951  Transition of Care [facility]) CM/SW Contact:    Villa Herb, LCSWA Phone Number: 11/28/2022, 10:27 AM   Clinical Narrative: Transition of Care Department [facility]) has reviewed patient and no TOC needs have been identified at this time. We will continue to monitor patient advancement through interdisciplinary progression rounds. If new patient transition needs arise, please place a TOC consult.  Transition of Care Asessment: Insurance and Status: Insurance coverage has been reviewed Patient has primary care physician: Yes Home environment has been reviewed: Home Prior level of function:: Independent Prior/Current Home Services: No current home services Social Determinants of Health Reivew: SDOH reviewed no interventions necessary Readmission risk has been reviewed: Yes Transition of care needs: no transition of care needs at this time

## 2022-11-28 NOTE — TOC Benefit Eligibility Note (Addendum)
Patient Product/process development scientist completed.    The patient is currently admitted and upon discharge could be taking Dificid 200 mg tablets.  The current 10 day co-pay is $1,351.63.   The patient is currently admitted and upon discharge could be taking vancomycin 125 mg capsules.  The current 10 day co-pay is $92.47.   The patient is insured through [employer] Part D   This test claim was processed through [facility]- copay amounts may vary at other pharmacies due to pharmacy/plan contracts, or as the patient moves through the different stages of their insurance plan.  Roland Earl, CPHT Pharmacy Patient Advocate Specialist [facility] Patient Advocate Team Direct Number: [phone]  Fax: [phone]

## 2022-11-28 NOTE — Progress Notes (Signed)
Patient discharged home today, transported self home in personal vehicle. Per MD Patricia [name] okay for patient to drive self home. Discharge summary went over with patient, patient verbalized understanding. Belongings sent home with patient.

## 2022-11-29 NOTE — Progress Notes (Unsigned)
History of Present Illness: Patricia [name] is a 72 y.o. female who presents today for follow up visit at [facility]. - GU History: 1. OAB with urinary frequency, urgency, and urge incontinence.  - Likely secondary to diabetic neuropathy. 2. Kidney stones. - Has had prior stone procedures including ureteroscopic stone manipulation and PCNL.  3. Left renal cyst.  At last visit with Dr. Ronne [name] on 09/14/2022: Patricia [name] increased to 50 mg daily.  Since last visit: - 10/14/2022: RUS showed no stones, masses, or hydronephrosis. Stable left renal cyst (measuring 3.3 x 3.4 x 3.0 cm).   - Admitted 11/26/2022 - 11/28/2022 for C. Diff enterocolitis and diarrhea with electrolyte abnormalities and AKI scondary to severe dehydration. Treated with IV fluid hydration for aggressive volume repletion along with oral Vancomycin x10 days started on 11/27/2022.  - 11/26/2022: CT abdomen/pelvis w/o contrast showed no stones, masses, or hydronephrosis. Stable 3.5 cm upper pole left renal cyst.  Today: She denies recent episode of stone pain / passage. She denies acute flank pain / abdominal pain. She denies fevers.  She denies nausea/ vomiting.  She reports that the Patricia [name] 50 mg has been helpful for her OAB symptoms. She denies dysuria, gross hematuria, straining to void, or sensations of incomplete emptying.   Fall Screening: Do you usually have a device to assist in your mobility? No   Medications: Current Outpatient Medications  Medication Sig Dispense Refill   albuterol (VENTOLIN HFA) 108 (90 Base) MCG/ACT inhaler Inhale 2 puffs into the lungs every 4 (four) hours as needed for shortness of breath.     APPLE CIDER VINEGAR PO Take 1 tablet by mouth daily.     cetirizine (ZYRTEC) 10 MG tablet Take 10 mg by mouth at bedtime.     CINNAMON PO Take 1,000 mg by mouth daily.     cyclobenzaprine (FLEXERIL) 5 MG tablet Take 1 tablet (5 mg total) by mouth 3 (three) times daily as needed  for muscle spasms. 30 tablet 0   escitalopram (LEXAPRO) 5 MG tablet Take 5 mg by mouth daily.     famotidine (PEPCID) 20 MG tablet One after supper (Patient taking differently: Take 20 mg by mouth every evening. One after supper) 30 tablet 11   levothyroxine (SYNTHROID) 175 MCG tablet Take 175 mcg by mouth daily.     Melatonin 10 MG CAPS Take 10 mg by mouth at bedtime as needed (sleep).     meloxicam (MOBIC) 15 MG tablet Take 15 mg by mouth as needed for pain.     metFORMIN (GLUCOPHAGE) 500 MG tablet Take 500 mg by mouth 2 (two) times daily.     mirabegron ER (Patricia [name]) 50 MG TB24 tablet Take 1 tablet (50 mg total) by mouth daily. 90 tablet 3   Omega-3 Fatty Acids (FISH OIL) 1000 MG CAPS Take 1 capsule by mouth daily.     ondansetron (ZOFRAN-ODT) 4 MG disintegrating tablet Take 1 tablet (4 mg total) by mouth every 8 (eight) hours as needed for nausea or vomiting. 20 tablet 1   pantoprazole (PROTONIX) 40 MG tablet Take 1 tablet (40 mg total) by mouth daily. Take 30-60 min before first meal of the day 30 tablet 2   pregabalin (LYRICA) 300 MG capsule Take 300 mg by mouth 2 (two) times daily.     saccharomyces boulardii (FLORASTOR) 250 MG capsule Take 1 capsule (250 mg total) by mouth 2 (two) times daily. 60 capsule 0   simvastatin (ZOCOR) 20 MG tablet Take 20  mg by mouth daily.     TIADYLT ER 360 MG 24 hr capsule Take 360 mg by mouth daily.     vancomycin (VANCOCIN) 125 MG capsule Take 1 capsule (125 mg total) by mouth 4 (four) times daily for 9 days. 36 capsule 0   No current facility-administered medications for this visit.    Allergies: Allergies  Allergen Reactions   Amlodipine Swelling   Keflex [Cephalexin] Rash    Past Medical History:  Diagnosis Date   Arthritis    Diabetes mellitus without complication (HCC)    Hypertension    Hypothyroidism    PONV (postoperative nausea and vomiting)    Past Surgical History:  Procedure Laterality Date   ABDOMINAL HYSTERECTOMY      CHOLECYSTECTOMY N/A 03/02/2015   Procedure: LAPAROSCOPIC CHOLECYSTECTOMY;  Surgeon: Patricia Macho, MD;  Location: [facility];  Service: General;  Laterality: N/A;   CYSTOSCOPY W/ URETERAL STENT PLACEMENT Bilateral 02/02/2022   Procedure: CYSTOSCOPY WITH RETROGRADE PYELOGRAM;  Surgeon: Patricia Gauze, MD;  Location: [facility];  Service: Urology;  Laterality: Bilateral;   CYSTOSCOPY WITH RETROGRADE PYELOGRAM, URETEROSCOPY AND STENT PLACEMENT Bilateral 03/03/2022   Procedure: CYSTOSCOPY WITH RETROGRADE PYELOGRAM, URETEROSCOPY AND STENT PLACEMENT;  Surgeon: Patricia Gauze, MD;  Location: [facility];  Service: Urology;  Laterality: Bilateral;   HOLMIUM LASER APPLICATION Right 05/02/2019   Procedure: HOLMIUM LASER APPLICATION;  Surgeon: Patricia Matar, MD;  Location: [facility];  Service: Urology;  Laterality: Right;   IR URETERAL STENT RIGHT NEW ACCESS W/O SEP NEPHROSTOMY CATH  05/02/2019   KNEE ARTHROSCOPY Right    LIVER BIOPSY  03/02/2015   Procedure: LAPAROSCOPIC LIVER BIOPSY;  Surgeon: Patricia Macho, MD;  Location: [facility];  Service: General;;   NEPHROLITHOTOMY Right 05/02/2019   Procedure: NEPHROLITHOTOMY PERCUTANEOUS;  Surgeon: Patricia Matar, MD;  Location: [facility];  Service: Urology;  Laterality: Right;  3 HRS   THYROIDECTOMY     TONSILLECTOMY  1970   TUBAL LIGATION     History reviewed. No pertinent family history. Social History   Socioeconomic History   Marital status: Widowed    Spouse name: Not on file   Number of children: Not on file   Years of education: Not on file   Highest education level: Not on file  Occupational History   Not on file  Tobacco Use   Smoking status: Never   Smokeless tobacco: Never  Vaping Use   Vaping Use: Never used  Substance and Sexual Activity   Alcohol use: No   Drug use: No   Sexual activity: Not Currently  Other Topics Concern   Not on file  Social History Narrative   Not on file   Social Determinants of Health   Financial Resource  Strain: Not on file  Food Insecurity: No Food Insecurity (11/26/2022)   Hunger Vital Sign    Worried About Running Out of Food in the Last Year: Never true    Ran Out of Food in the Last Year: Never true  Transportation Needs: No Transportation Needs (11/26/2022)   PRAPARE - Administrator, Civil Service (Medical): No    Lack of Transportation (Non-Medical): No  Physical Activity: Not on file  Stress: Not on file  Social Connections: Not on file  Intimate Partner Violence: Not At Risk (11/26/2022)   Humiliation, Afraid, Rape, and Kick questionnaire    Fear of Current or Ex-Partner: No    Emotionally Abused: No    Physically Abused: No    Sexually  Abused: No    SUBJECTIVE  Review of Systems Constitutional: Patient denies any unintentional weight loss or change in strength lntegumentary: Patient denies any rashes or pruritus Eyes: Patient denies dry eyes ENT: Patient denies dry mouth Cardiovascular: Patient denies chest pain or syncope Respiratory: Patient denies shortness of breath Gastrointestinal: Patient denies nausea, vomiting, constipation, or diarrhea Musculoskeletal: Patient denies muscle cramps or weakness Neurologic: Patient denies convulsions or seizures Psychiatric: Patient denies memory problems Allergic/Immunologic: Patient denies recent allergic reaction(s) Hematologic/Lymphatic: Patient denies bleeding tendencies Endocrine: Patient denies heat/cold intolerance  GU: As per HPI.  OBJECTIVE Vitals:   11/30/22 1149  BP: 131/67  Pulse: 72  Temp: 98.2 F (36.8 C)   There is no height or weight on file to calculate BMI.  Physical Examination  Constitutional: No obvious distress; patient is non-toxic appearing  Cardiovascular: No visible lower extremity edema.  Respiratory: The patient does not have audible wheezing/stridor; respirations do not appear labored  Gastrointestinal: Abdomen non-distended Musculoskeletal: Normal ROM of UEs  Skin: No obvious  rashes/open sores  Neurologic: CN 2-12 grossly intact Psychiatric: Answered questions appropriately with normal affect  Hematologic/Lymphatic/Immunologic: No obvious bruises or sites of spontaneous bleeding  UA: negative PVR: 160 ml  ASSESSMENT Overactive bladder - Plan: Urinalysis, Routine w reflex microscopic, BLADDER SCAN AMB NON-IMAGING  Kidney stones - Plan: ondansetron (ZOFRAN-ODT) 4 MG disintegrating tablet, DG Abd 1 View  Urinary urgency  Urge incontinence  Urinary frequency  Enterocolitis - Plan: ondansetron (ZOFRAN-ODT) 4 MG disintegrating tablet  Hospital discharge follow-up - Plan: ondansetron (ZOFRAN-ODT) 4 MG disintegrating tablet  Nausea - Plan: ondansetron (ZOFRAN-ODT) 4 MG disintegrating tablet  Renal cyst - left  She is doing well today. Advised to complete Vancomycin as prescribed for C. Diff. Zofran prescribed for PRN use for nausea.  We reviewed recent imaging results; no acute GU findings. Advised adequate hydration for stone prevention. Will plan to follow up in 6 months with KUB for stone surveillance or sooner if needed. Pt verbalized understanding and agreement. All questions were answered.  PLAN Advised the following: Maintain adequate fluid intake. Complete antibiotic as prescribed.  Zofran PRN.  Continue Patricia [name] 50 mg daily for OAB. Return in about 6 months (around 06/01/2023) for KUB, UA, & f/u with Evette Georges NP.  Orders Placed This Encounter  Procedures   DG Abd 1 View    Standing Status:   Future    Standing Expiration Date:   11/30/2023    Order Specific Question:   Reason for Exam (SYMPTOM  OR DIAGNOSIS REQUIRED)    Answer:   kidney stone    Order Specific Question:   Preferred imaging location?    Answer:   [facility]   Urinalysis, Routine w reflex microscopic   BLADDER SCAN AMB NON-IMAGING    It has been explained that the patient is to follow regularly with their PCP in addition to all other providers involved in  their care and to follow instructions provided by these respective offices. Patient advised to contact urology clinic if any urologic-pertaining questions, concerns, new symptoms or problems arise in the interim period.  There are no Patient Instructions on file for this visit.  Electronically signed by:  Donnita Falls, MSN, FNP-C, CUNP 11/30/2022 12:10 PM

## 2022-11-30 ENCOUNTER — Encounter: Payer: Self-pay | Admitting: Urology

## 2022-11-30 ENCOUNTER — Ambulatory Visit (INDEPENDENT_AMBULATORY_CARE_PROVIDER_SITE_OTHER): Payer: PPO | Admitting: Urology

## 2022-11-30 VITALS — BP 131/67 | HR 72 | Temp 98.2°F

## 2022-11-30 DIAGNOSIS — N3281 Overactive bladder: Secondary | ICD-10-CM | POA: Diagnosis not present

## 2022-11-30 DIAGNOSIS — R3915 Urgency of urination: Secondary | ICD-10-CM | POA: Insufficient documentation

## 2022-11-30 DIAGNOSIS — R350 Frequency of micturition: Secondary | ICD-10-CM

## 2022-11-30 DIAGNOSIS — R110 Nausea: Secondary | ICD-10-CM

## 2022-11-30 DIAGNOSIS — N3941 Urge incontinence: Secondary | ICD-10-CM | POA: Diagnosis not present

## 2022-11-30 DIAGNOSIS — N200 Calculus of kidney: Secondary | ICD-10-CM

## 2022-11-30 DIAGNOSIS — N281 Cyst of kidney, acquired: Secondary | ICD-10-CM

## 2022-11-30 DIAGNOSIS — K529 Noninfective gastroenteritis and colitis, unspecified: Secondary | ICD-10-CM

## 2022-11-30 DIAGNOSIS — Z09 Encounter for follow-up examination after completed treatment for conditions other than malignant neoplasm: Secondary | ICD-10-CM

## 2022-11-30 LAB — URINALYSIS, ROUTINE W REFLEX MICROSCOPIC
Bilirubin, UA: NEGATIVE
Ketones, UA: NEGATIVE
Leukocytes,UA: NEGATIVE
Nitrite, UA: NEGATIVE
Protein,UA: NEGATIVE
RBC, UA: NEGATIVE
Specific Gravity, UA: 1.01 (ref 1.005–1.030)
Urobilinogen, Ur: 0.2 mg/dL (ref 0.2–1.0)
pH, UA: 5.5 (ref 5.0–7.5)

## 2022-11-30 LAB — BLADDER SCAN AMB NON-IMAGING

## 2022-11-30 MED ORDER — ONDANSETRON 4 MG PO TBDP
4.0000 mg | ORAL_TABLET | Freq: Three times a day (TID) | ORAL | 1 refills | Status: AC | PRN
Start: 2022-11-30 — End: ?

## 2022-12-04 NOTE — Progress Notes (Unsigned)
Patricia [name], female    DOB: 1950-07-11   MRN: [id number]   Brief patient profile:  89  yowf  referred to pulmonary clinic 10/14/2022 by Dr Phillips Odor  for doe ? Etiology aburpt onset early March 2024 during her usual walk from [place] to car in usual parking space - has not recurred but does happen coming back from mailbox    Has h/o uti's and kidney stones on macrodantin multiple times but not on maint rx.   History of Present Illness  10/14/2022  Pulmonary/ 1st office eval/Anmol Fleck  Chief Complaint  Patient presents with   Consult    Pt consult for DOE, she states that reset makes her symptoms better. Never smoked - thyroid removed in 1994. Not using oxygen   Dyspnea:  mb and back to house and stops half way  Cough: noct cough x years on prn codeine and wheeze is new since the event [place] the [place] after big meal but hasn't happened since walking same route ( s the big meal)  Sleep: bed is flat R side down one pillow  SABA use: seems to help if uses albuterol but only did it once, maybe twice since she got it - codeine helps the most. Rec Continue walking mailbox and back daily for a week and then stop trelegy to see what difference it makes off it - if worse then restart  Only use your albuterol as a rescue medication Also  Ok to try albuterol x 2 puffs x 15 min before an activity (on alternating days)  that you know would usually make you short of breath and see if it makes any difference and if makes none then don't take albuterol after activity unless you can't catch your breath as this means it's the resting that helps, not the albuterol. Pantoprazole (protonix) 40 mg   Take  30-60 min before first meal of the day and Pepcid (famotidine)  20 mg after supper until return to office  GERD diet reviewed, bed blocks rec   Allergy screen 10/14/22   >  Eos 0. 4/  IgE  <2   My office will be contacting you by phone for referral for PFTs - do not take any inhalers that  day   12/05/2022  f/u ov/[place] office/Kipp Shank re: *** maint on ***  No chief complaint on file.   Dyspnea:  *** Cough: *** Sleeping: *** SABA use: *** 02: *** Covid status: *** Lung cancer screening: ***   No obvious day to day or daytime variability or assoc excess/ purulent sputum or mucus plugs or hemoptysis or cp or chest tightness, subjective wheeze or overt sinus or hb symptoms.   *** without nocturnal  or early am exacerbation  of respiratory  c/o's or need for noct saba. Also denies any obvious fluctuation of symptoms with weather or environmental changes or other aggravating or alleviating factors except as outlined above   No unusual exposure hx or h/o childhood pna/ asthma or knowledge of premature birth.  Current Allergies, Complete Past Medical History, Past Surgical History, Family History, and Social History were reviewed in [employer] record.  ROS  The following are not active complaints unless bolded Hoarseness, sore throat, dysphagia, dental problems, itching, sneezing,  nasal congestion or discharge of excess mucus or purulent secretions, ear ache,   fever, chills, sweats, unintended wt loss or wt gain, classically pleuritic or exertional cp,  orthopnea pnd or arm/hand swelling  or leg swelling, presyncope,  palpitations, abdominal pain, anorexia, nausea, vomiting, diarrhea  or change in bowel habits or change in bladder habits, change in stools or change in urine, dysuria, hematuria,  rash, arthralgias, visual complaints, headache, numbness, weakness or ataxia or problems with walking or coordination,  change in mood or  memory.        No outpatient medications have been marked as taking for the 12/05/22 encounter (Appointment) with Nyoka Cowden, MD.                     Past Medical History:  Diagnosis Date   Arthritis    Diabetes mellitus without complication (HCC)    Hypertension    Hypothyroidism    PONV (postoperative nausea  and vomiting)        Objective:     Wt Readings from Last 3 Encounters:  11/26/22 197 lb 12 oz (89.7 kg)  10/14/22 200 lb 1.6 oz (90.8 kg)  03/04/22 178 lb (80.7 kg)      Vital signs reviewed  12/05/2022  - Note at rest 02 sats  ***% on ***   General appearance:    ***       Mild resting tremor/ head bobbing***            Assessment

## 2022-12-05 ENCOUNTER — Encounter: Payer: Self-pay | Admitting: Internal Medicine

## 2022-12-05 ENCOUNTER — Ambulatory Visit: Payer: PPO | Admitting: Internal Medicine

## 2022-12-05 VITALS — BP 122/71 | HR 78 | Ht 64.0 in | Wt 206.0 lb

## 2022-12-05 DIAGNOSIS — R0609 Other forms of dyspnea: Secondary | ICD-10-CM | POA: Diagnosis not present

## 2022-12-05 DIAGNOSIS — R058 Other specified cough: Secondary | ICD-10-CM | POA: Diagnosis not present

## 2022-12-05 NOTE — Patient Instructions (Addendum)
Try off pantoprazole and replace it with Pepcid 20 mg (famotidine)  twice daily - after bfast and supper for a month then try taking as needed   For drainage / throat tickle stop zyrtec and try take CHLORPHENIRAMINE  4 mg  ("Allergy Relief" 4mg   at [facility] should be easiest to find in the blue box usually on bottom shelf)  take one every 4 hours as needed - extremely effective and inexpensive over the counter- may cause drowsiness so start with just a dose or two an hour before bedtime and see how you tolerate it before trying in daytime.    Pulmonary follow up is as needed for the cough - allergy consultation would be next step - Dr Dellis Anes  I would avoid macrodantin in all forms (nitrofurantoin)

## 2022-12-05 NOTE — Assessment & Plan Note (Signed)
Onset "decades" prior to initial pulmonary eval 10/14/22  - Allergy screen 10/14/22   >  Eos 0. 4/  IgE  <2 - only present hs/ no improvement on gerd rx as of 12/05/2022 so rec adding 1st gen H1 blockers per guidelines  and allergy eval prn   Pulmonary follow up is prn          Each maintenance medication was reviewed in detail including emphasizing most importantly the difference between maintenance and prns and under what circumstances the prns are to be triggered using an action plan format where appropriate.  Total time for H and P, chart review, counseling,   and generating customized AVS unique to this office visit / same day charting = 32 min summary final follow up

## 2022-12-05 NOTE — Assessment & Plan Note (Addendum)
Never smoker with no h/o atopy - Onset abrupt early March 2024 while walking chruch to usual parking place but did not recur - newly reported doe from MB to house p event at church, stops half way, some better with Trelegy  - noct dry cough x "longtime" better with codeine , maybe once or twice better with albuterol  - FENO 10/14/22 could not perform  - 10/14/2022   Walked on RA  x  2  lap(s) =  approx 300  ft  @ slow pace, stopped due to tired s sob  with lowest 02 sats 95%   - Allergy screen 10/14/22   >  Eos 0. 4/  IgE  <2 - 10/14/2022 rx for gerd and off trelegy >>> doe resolved 12/05/2022 but no change in noct cough so added 1st gen H1 blockers per guidelines    Still concerned about possibility of macrodantin pulmonary toxicity in pt with unexplained and abrupt on doe that has resolved s further exposures so would rec monitor 02 sats with exertion and stay as active as possible with follow up here prn worsening while maintain off macrodantin at least in short run (next 6-12 months) and only use if no alternative available

## 2022-12-07 DIAGNOSIS — Z6833 Body mass index (BMI) 33.0-33.9, adult: Secondary | ICD-10-CM | POA: Diagnosis not present

## 2022-12-07 DIAGNOSIS — R251 Tremor, unspecified: Secondary | ICD-10-CM | POA: Diagnosis not present

## 2022-12-07 DIAGNOSIS — A0472 Enterocolitis due to Clostridium difficile, not specified as recurrent: Secondary | ICD-10-CM | POA: Diagnosis not present

## 2022-12-07 DIAGNOSIS — E6609 Other obesity due to excess calories: Secondary | ICD-10-CM | POA: Diagnosis not present

## 2022-12-07 DIAGNOSIS — Z0001 Encounter for general adult medical examination with abnormal findings: Secondary | ICD-10-CM | POA: Diagnosis not present

## 2022-12-07 DIAGNOSIS — Z1331 Encounter for screening for depression: Secondary | ICD-10-CM | POA: Diagnosis not present

## 2022-12-20 DIAGNOSIS — E890 Postprocedural hypothyroidism: Secondary | ICD-10-CM | POA: Diagnosis not present

## 2022-12-20 DIAGNOSIS — E1140 Type 2 diabetes mellitus with diabetic neuropathy, unspecified: Secondary | ICD-10-CM | POA: Diagnosis not present

## 2022-12-20 DIAGNOSIS — E782 Mixed hyperlipidemia: Secondary | ICD-10-CM | POA: Diagnosis not present

## 2022-12-20 DIAGNOSIS — Z6832 Body mass index (BMI) 32.0-32.9, adult: Secondary | ICD-10-CM | POA: Diagnosis not present

## 2022-12-20 DIAGNOSIS — E7849 Other hyperlipidemia: Secondary | ICD-10-CM | POA: Diagnosis not present

## 2022-12-20 DIAGNOSIS — E6609 Other obesity due to excess calories: Secondary | ICD-10-CM | POA: Diagnosis not present

## 2023-01-16 ENCOUNTER — Other Ambulatory Visit: Payer: Self-pay | Admitting: Internal Medicine

## 2023-01-31 DIAGNOSIS — Z6832 Body mass index (BMI) 32.0-32.9, adult: Secondary | ICD-10-CM | POA: Diagnosis not present

## 2023-01-31 DIAGNOSIS — M5136 Other intervertebral disc degeneration, lumbar region: Secondary | ICD-10-CM | POA: Diagnosis not present

## 2023-01-31 DIAGNOSIS — E6609 Other obesity due to excess calories: Secondary | ICD-10-CM | POA: Diagnosis not present

## 2023-02-07 DIAGNOSIS — E890 Postprocedural hypothyroidism: Secondary | ICD-10-CM | POA: Diagnosis not present

## 2023-02-22 DIAGNOSIS — Z6834 Body mass index (BMI) 34.0-34.9, adult: Secondary | ICD-10-CM | POA: Diagnosis not present

## 2023-02-22 DIAGNOSIS — E669 Obesity, unspecified: Secondary | ICD-10-CM | POA: Diagnosis not present

## 2023-02-22 DIAGNOSIS — I10 Essential (primary) hypertension: Secondary | ICD-10-CM | POA: Diagnosis not present

## 2023-02-22 DIAGNOSIS — H6691 Otitis media, unspecified, right ear: Secondary | ICD-10-CM | POA: Diagnosis not present

## 2023-03-06 DIAGNOSIS — Z23 Encounter for immunization: Secondary | ICD-10-CM | POA: Diagnosis not present

## 2023-03-17 ENCOUNTER — Encounter: Payer: Self-pay | Admitting: Internal Medicine

## 2023-03-17 ENCOUNTER — Ambulatory Visit: Payer: PPO | Attending: Internal Medicine | Admitting: Internal Medicine

## 2023-03-17 VITALS — BP 128/74 | HR 80 | Ht 64.0 in | Wt 206.0 lb

## 2023-03-17 DIAGNOSIS — R079 Chest pain, unspecified: Secondary | ICD-10-CM

## 2023-03-17 DIAGNOSIS — R0602 Shortness of breath: Secondary | ICD-10-CM | POA: Diagnosis not present

## 2023-03-17 DIAGNOSIS — G4733 Obstructive sleep apnea (adult) (pediatric): Secondary | ICD-10-CM

## 2023-03-17 MED ORDER — FUROSEMIDE 20 MG PO TABS: 20.0000 mg | ORAL_TABLET | Freq: Every day | ORAL | 3 refills | Status: AC | PRN

## 2023-03-17 NOTE — Progress Notes (Signed)
Cardiology Office Note  Date: 03/17/2023   ID: Patricia [name], DOB Oct 25, 1950, MRN [id number]  PCP:  Assunta Found, MD  Cardiologist:  Marjo Bicker, MD Electrophysiologist:  None   History of Present Illness: Patricia [name] is a 72 y.o. female known to have HTN, DM 2, hypothyroidism was referred to cardiology today for management of SOB and chest pressure.  TSH was 8 in 7/24 after which her PCP increased levothyroxine dose to 200 mcg.  Repeat TSH was within normal limits in 8/24.  She also gained almost 20 pounds around this time and noticed DOE since August 2024. Sometimes even at rest, she has to gasp for air.  SOB chest pressure on 1 occasion.  Otherwise no angina, orthopnea.  Sometimes she does wake up in the middle of night catching for air but she was prescribed inhaler by her PCP/pulm and this eases her SOB but not completely.  No leg swelling.  No presyncope, syncope and palpitations.  No prior history of CAD.  Past Medical History:  Diagnosis Date   Arthritis    Diabetes mellitus without complication (HCC)    Hypertension    Hypothyroidism    PONV (postoperative nausea and vomiting)     Past Surgical History:  Procedure Laterality Date   ABDOMINAL HYSTERECTOMY     CHOLECYSTECTOMY N/A 03/02/2015   Procedure: LAPAROSCOPIC CHOLECYSTECTOMY;  Surgeon: Franky Macho, MD;  Location: [facility];  Service: General;  Laterality: N/A;   CYSTOSCOPY W/ URETERAL STENT PLACEMENT Bilateral 02/02/2022   Procedure: CYSTOSCOPY WITH RETROGRADE PYELOGRAM;  Surgeon: Malen Gauze, MD;  Location: [facility];  Service: Urology;  Laterality: Bilateral;   CYSTOSCOPY WITH RETROGRADE PYELOGRAM, URETEROSCOPY AND STENT PLACEMENT Bilateral 03/03/2022   Procedure: CYSTOSCOPY WITH RETROGRADE PYELOGRAM, URETEROSCOPY AND STENT PLACEMENT;  Surgeon: Malen Gauze, MD;  Location: [facility];  Service: Urology;  Laterality: Bilateral;   HOLMIUM LASER APPLICATION Right 05/02/2019   Procedure: HOLMIUM  LASER APPLICATION;  Surgeon: Marcine Matar, MD;  Location: [facility];  Service: Urology;  Laterality: Right;   IR URETERAL STENT RIGHT NEW ACCESS W/O SEP NEPHROSTOMY CATH  05/02/2019   KNEE ARTHROSCOPY Right    LIVER BIOPSY  03/02/2015   Procedure: LAPAROSCOPIC LIVER BIOPSY;  Surgeon: Franky Macho, MD;  Location: [facility];  Service: General;;   NEPHROLITHOTOMY Right 05/02/2019   Procedure: NEPHROLITHOTOMY PERCUTANEOUS;  Surgeon: Marcine Matar, MD;  Location: [facility];  Service: Urology;  Laterality: Right;  3 HRS   THYROIDECTOMY     TONSILLECTOMY  1970   TUBAL LIGATION      Current Outpatient Medications  Medication Sig Dispense Refill   albuterol (VENTOLIN HFA) 108 (90 Base) MCG/ACT inhaler Inhale 2 puffs into the lungs every 4 (four) hours as needed for shortness of breath.     APPLE CIDER VINEGAR PO Take 1 tablet by mouth daily.     cetirizine (ZYRTEC) 10 MG tablet Take 10 mg by mouth at bedtime.     CINNAMON PO Take 1,000 mg by mouth daily.     cyclobenzaprine (FLEXERIL) 5 MG tablet Take 1 tablet (5 mg total) by mouth 3 (three) times daily as needed for muscle spasms. 30 tablet 0   escitalopram (LEXAPRO) 5 MG tablet Take 5 mg by mouth daily.     levothyroxine (SYNTHROID) 200 MCG tablet Take 200 mcg by mouth daily.     Melatonin 10 MG CAPS Take 10 mg by mouth at bedtime as needed (sleep).     meloxicam (MOBIC) 15  MG tablet Take 15 mg by mouth as needed for pain.     metFORMIN (GLUCOPHAGE) 500 MG tablet Take 500 mg by mouth 2 (two) times daily.     mirabegron ER (MYRBETRIQ) 50 MG TB24 tablet Take 1 tablet (50 mg total) by mouth daily. 90 tablet 3   Omega-3 Fatty Acids (FISH OIL) 1000 MG CAPS Take 1 capsule by mouth daily.     pregabalin (LYRICA) 300 MG capsule Take 300 mg by mouth 2 (two) times daily.     simvastatin (ZOCOR) 20 MG tablet Take 20 mg by mouth daily.     TIADYLT ER 360 MG 24 hr capsule Take 360 mg by mouth daily.     No current facility-administered medications for  this visit.   Allergies:  Amlodipine and Keflex [cephalexin]   Social History: The patient  reports that she has never smoked. She has never used smokeless tobacco. She reports that she does not drink alcohol and does not use drugs.   Family History: The patient's family history includes Aneurysm in her father; Heart attack in her maternal grandfather and maternal grandmother; Leukemia in her mother.   ROS:  Please see the history of present illness. Otherwise, complete review of systems is positive for none.  All other systems are reviewed and negative.   Physical Exam: VS:  BP 128/74   Pulse 80   Ht 5\' 4"  (1.626 m)   Wt 206 lb (93.4 kg)   SpO2 96%   BMI 35.36 kg/m , BMI Body mass index is 35.36 kg/m.  Wt Readings from Last 3 Encounters:  03/17/23 206 lb (93.4 kg)  12/05/22 206 lb (93.4 kg)  11/26/22 197 lb 12 oz (89.7 kg)    General: Patient appears comfortable at rest. HEENT: Conjunctiva and lids normal, oropharynx clear with moist mucosa. Neck: Supple, no elevated JVP or carotid bruits, no thyromegaly. Lungs: Clear to auscultation, nonlabored breathing at rest. Cardiac: Regular rate and rhythm, no S3 or significant systolic murmur, no pericardial rub. Abdomen: Soft, nontender, no hepatomegaly, bowel sounds present, no guarding or rebound. Extremities: No pitting edema, distal pulses 2+. Skin: Warm and dry. Musculoskeletal: No kyphosis. Neuropsychiatric: Alert and oriented x3, affect grossly appropriate.  Recent Labwork: 10/14/2022: BNP 5.0; TSH 4.240 11/26/2022: ALT 37; AST 28 11/28/2022: BUN 19; Creatinine, Ser 0.90; Hemoglobin 12.1; Magnesium 1.8; Platelets 230; Potassium 3.4; Sodium 140  No results found for: "CHOL", "TRIG", "HDL", "CHOLHDL", "VLDL", "LDLCALC", "LDLDIRECT"   Assessment and Plan:  DOE HTN  (Self-reported diagnosis, not on antihypertensives) DM 2 HLD Hypothyroidism OSA  -DOE x 1 month, no orthopnea, occasional PND.  No leg swelling.  Start p.o.  Lasix 20 mg as needed for SOB.  Obtain 2D echocardiogram. Unclear if her DOE is from recent 20 pound weight gain or HFpEF symptom.  Will see her back in 3 months and obtain Lexiscan due to cardiac risk factors of HTN, DM2 and advanced age. -Self-reported she had HTN but currently not on any antihypertensives.  On metformin 500 mg twice daily for diabetes mellitus type 2 management, follows with PCP. -Continue simvastatin 20 mg nightly, goal LDL is 100.  Follows with PCP. -STOP-BANG score is 5, she will benefit from home sleep apnea testing.    Medication Adjustments/Labs and Tests Ordered: Current medicines are reviewed at length with the patient today.  Concerns regarding medicines are outlined above.    Disposition:  Follow up 3 months  Signed, Redell Nazir Verne Spurr, MD, 03/17/2023 9:20 AM  [facility] [address] [place], [place] [address]

## 2023-03-17 NOTE — Patient Instructions (Signed)
Medication Instructions:  Your physician has recommended you make the following change in your medication:   -Start Lasix 20 mg tablet daily as needed for shortness of breath.   *If you need a refill on your cardiac medications before your next appointment, please call your pharmacy*   Lab Work: None If you have labs (blood work) drawn today and your tests are completely normal, you will receive your results only by: MyChart Message (if you have MyChart) OR A paper copy in the mail If you have any lab test that is abnormal or we need to change your treatment, we will call you to review the results.   Testing/Procedures: Your physician has requested that you have an echocardiogram. Echocardiography is a painless test that uses sound waves to create images of your heart. It provides your doctor with information about the size and shape of your heart and how well your heart's chambers and valves are working. This procedure takes approximately one hour. There are no restrictions for this procedure. Please do NOT wear cologne, perfume, aftershave, or lotions (deodorant is allowed). Please arrive 15 minutes prior to your appointment time.    Follow-Up: At [facility], you and your health needs are our priority.  As part of our continuing mission to provide you with exceptional heart care, we have created designated Provider Care Teams.  These Care Teams include your primary Cardiologist (physician) and Advanced Practice Providers (APPs -  Physician Assistants and Nurse Practitioners) who all work together to provide you with the care you need, when you need it.  We recommend signing up for the patient portal called "MyChart".  Sign up information is provided on this After Visit Summary.  MyChart is used to connect with patients for Virtual Visits (Telemedicine).  Patients are able to view lab/test results, encounter notes, upcoming appointments, etc.  Non-urgent messages can be sent to  your provider as well.   To learn more about what you can do with MyChart, go to [email or web address].    Your next appointment:   3 month(s)  Provider:   You may see Vishnu P Mallipeddi, MD or one of the following Advanced Practice Providers on your designated Care Team:   [place], PA-C  Jacolyn Reedy, PA-C     Other Instructions Home Sleep Study.

## 2023-03-31 ENCOUNTER — Telehealth: Payer: Self-pay | Admitting: Internal Medicine

## 2023-03-31 NOTE — Telephone Encounter (Signed)
Dr. Jenene Slicker ordered [place] on 03/17/23, pt is calling to follow up. Please advise.

## 2023-04-03 NOTE — Telephone Encounter (Signed)
**Note De-Identified [place] Obfuscation** Ordering provider: Dr Jenene Slicker Associated diagnoses: R06.09-Paroxysmal nocturnal dyspnea WatchPAT PA started on 04/03/2023 by Johanan Skorupski, Lorelle Formosa, LPN. Authorization: #[id number] Vailid dates: 04/02/13-07/02/2023.         Outpatient Authorization #[id number]  [facility] [place] : HTA Authorization Decision: Approved   Request Date/Time:04/03/2023 09:02:40   Patient notified of PIN (1234) on 04/03/2023 Angeline Trick VM message (ok per DPR) and a [facility] message. I did request that she do her HST within 2 weeks from todays date.  Phone note routed to covering staff for follow-up.

## 2023-04-09 ENCOUNTER — Encounter (INDEPENDENT_AMBULATORY_CARE_PROVIDER_SITE_OTHER): Payer: Self-pay | Admitting: Cardiology

## 2023-04-09 DIAGNOSIS — G4710 Hypersomnia, unspecified: Secondary | ICD-10-CM | POA: Diagnosis not present

## 2023-04-09 DIAGNOSIS — G4719 Other hypersomnia: Secondary | ICD-10-CM

## 2023-04-10 NOTE — Telephone Encounter (Signed)
Testing completed

## 2023-04-18 ENCOUNTER — Ambulatory Visit: Payer: PPO | Attending: Internal Medicine

## 2023-04-18 DIAGNOSIS — G4733 Obstructive sleep apnea (adult) (pediatric): Secondary | ICD-10-CM

## 2023-04-18 NOTE — Procedures (Signed)
SLEEP STUDY REPORT Patient Information Study Date: 04/09/2023 Patient Name: Patricia [name] Patient ID: [id number] Birth Date: 1951-04-01 Age: 72 Gender: Female BMI: 35.4 (W=207 lb, H=5' 4'') Referring Physician: Luane School, MD  TEST DESCRIPTION: Home sleep apnea testing was completed using the WatchPat, a Type 1 device, utilizing peripheral arterial tonometry (PAT), chest movement, actigraphy, pulse oximetry, pulse rate, body position and snore. AHI was calculated with apnea and hypopnea using valid sleep time as the denominator. RDI includes apneas, hypopneas, and RERAs. The data acquired and the scoring of sleep and all associated events were performed in accordance with the recommended standards and specifications as outlined in the AASM Manual for the Scoring of Sleep and Associated Events 2.2.0 (2015).  FINDINGS: 1. No evidence of Obstructive Sleep Apnea with AHI 1.8/hr. 2. No Central Sleep Apnea. 3. Oxygen desaturations as low as 85%. 4. Severe snoring was present. O2 sats were < 88% for 33 minutes. 5. Total sleep time was 8 hrs and 32 min. 6. 18.3% of total sleep time was spent in REM sleep. 7. Shortened sleep onset latency at 6 min. 8. Shortened REM sleep onset latency at 71 min. 9. Total awakenings were 5.  DIAGNOSIS: Nondiagnostic study due to poor PAT amplitude  RECOMMENDATIONS: Recommend repeat study with PSG  Signature: Armanda Magic, MD; [facility]; Diplomat, American Board of Sleep Medicine Electronically Signed: 04/18/2023 4:39:17 AM

## 2023-04-20 ENCOUNTER — Ambulatory Visit (HOSPITAL_COMMUNITY)
Admission: RE | Admit: 2023-04-20 | Discharge: 2023-04-20 | Disposition: A | Discharge status: Home / Self Care | Payer: PPO | Source: Ambulatory Visit | Attending: Internal Medicine | Admitting: Internal Medicine

## 2023-04-20 DIAGNOSIS — R0602 Shortness of breath: Secondary | ICD-10-CM | POA: Insufficient documentation

## 2023-04-20 LAB — ECHOCARDIOGRAM COMPLETE
AR max vel: 2.15 cm2
AV Area VTI: 2.15 cm2
AV Area mean vel: 2.07 cm2
AV Mean grad: 7 mm[Hg]
AV Peak grad: 12.5 mm[Hg]
Ao pk vel: 1.77 m/s
Area-P 1/2: 3.03 cm2
S' Lateral: 2.6 cm

## 2023-04-20 NOTE — Progress Notes (Signed)
*  PRELIMINARY RESULTS* Echocardiogram 2D Echocardiogram has been performed.  Stacey Drain 04/20/2023, 11:17 AM

## 2023-04-21 ENCOUNTER — Telehealth: Payer: Self-pay | Admitting: Internal Medicine

## 2023-04-21 NOTE — Telephone Encounter (Signed)
Patient states she was advised that she may have to repeat her sleep study and she would like a call back to discuss.

## 2023-05-12 ENCOUNTER — Telehealth: Payer: Self-pay | Admitting: *Deleted

## 2023-05-12 NOTE — Telephone Encounter (Signed)
Reached out to the patient to discuss her sleep study she does not want to do the in lab study.

## 2023-05-12 NOTE — Telephone Encounter (Signed)
-----   Message from Armanda Magic sent at 04/18/2023  4:41 AM EDT ----- Poor PAT tracing - nondiagnostic study - please set up for in lab PSG

## 2023-05-12 NOTE — Telephone Encounter (Signed)
The patient has been notified of the result and verbalized understanding.  All questions (if any) were answered. Latrelle Dodrill, CMA 05/12/2023 2:41 PM

## 2023-05-12 NOTE — Telephone Encounter (Signed)
The patient has been notified of the result and verbalized understanding.  All questions (if any) were answered. Patricia [name], CMA 05/12/2023 2:45 PM    Patient has declined to go in the sleep to retest.

## 2023-05-30 NOTE — Progress Notes (Unsigned)
Name: Patricia [name] DOB: 1950-08-22 MRN: [id number]  History of Present Illness: Ms. Patricia [name] is a 72 y.o. female who presents today for follow up visit at [facility] [place]. - GU History: 1. OAB with urinary frequency, urgency, and urge incontinence.  - Likely secondary to diabetic neuropathy. 2. Kidney stones. - Has had prior stone procedures including ureteroscopic stone manipulation and PCNL.  - 11/26/2022: CT abdomen/pelvis w/o contrast showed no GU stones or hydronephrosis. 3. Left renal cyst.  At last visit on 11/30/2022: Doing well on Myrbetriq 50 mg.  Today: KUB today: Awaiting radiology read; no appreciated per provider interpretation.  She denies recent stone passage. She denies flank pain or abdominal pain. Denies dysuria, gross hematuria, hesitancy, straining to void, or sensations of incomplete emptying.  She reports increased urinary urgency and frequency.  She reports urge incontinence. She reports stress incontinence with cough/laugh/sneeze. She reports the UUI is predominant.  She leaks multiple times per day. Wears 2-3 pads per day. She reports urinary incontinence is significantly bothersome.   Reports 1 cup of decaf coffee each morning and caffeine-free sodas; no tea.    Fall Screening: Do you usually have a device to assist in your mobility? No   Medications: Current Outpatient Medications  Medication Sig Dispense Refill   albuterol (VENTOLIN HFA) 108 (90 Base) MCG/ACT inhaler Inhale 2 puffs into the lungs every 4 (four) hours as needed for shortness of breath.     APPLE CIDER VINEGAR PO Take 1 tablet by mouth daily.     cetirizine (ZYRTEC) 10 MG tablet Take 10 mg by mouth at bedtime.     CINNAMON PO Take 1,000 mg by mouth daily.     cyclobenzaprine (FLEXERIL) 5 MG tablet Take 1 tablet (5 mg total) by mouth 3 (three) times daily as needed for muscle spasms. 30 tablet 0   escitalopram (LEXAPRO) 5 MG tablet Take 5 mg by mouth daily.      furosemide (LASIX) 20 MG tablet Take 1 tablet (20 mg total) by mouth daily as needed (Shortness of breath). 30 tablet 3   levothyroxine (SYNTHROID) 200 MCG tablet Take 200 mcg by mouth daily.     Melatonin 10 MG CAPS Take 10 mg by mouth at bedtime as needed (sleep).     meloxicam (MOBIC) 15 MG tablet Take 15 mg by mouth as needed for pain.     metFORMIN (GLUCOPHAGE) 500 MG tablet Take 500 mg by mouth 2 (two) times daily.     mirabegron ER (MYRBETRIQ) 50 MG TB24 tablet Take 1 tablet (50 mg total) by mouth daily. 90 tablet 3   Omega-3 Fatty Acids (FISH OIL) 1000 MG CAPS Take 1 capsule by mouth daily.     pregabalin (LYRICA) 300 MG capsule Take 300 mg by mouth 2 (two) times daily.     simvastatin (ZOCOR) 20 MG tablet Take 20 mg by mouth daily.     TIADYLT ER 360 MG 24 hr capsule Take 360 mg by mouth daily.     No current facility-administered medications for this visit.    Allergies: Allergies  Allergen Reactions   Amlodipine Swelling   Keflex [Cephalexin] Rash    Past Medical History:  Diagnosis Date   Arthritis    Diabetes mellitus without complication (HCC)    Hypertension    Hypothyroidism    PONV (postoperative nausea and vomiting)    Past Surgical History:  Procedure Laterality Date   ABDOMINAL HYSTERECTOMY     CHOLECYSTECTOMY N/A 03/02/2015  Procedure: LAPAROSCOPIC CHOLECYSTECTOMY;  Surgeon: Franky Macho, MD;  Location: [facility];  Service: General;  Laterality: N/A;   CYSTOSCOPY W/ URETERAL STENT PLACEMENT Bilateral 02/02/2022   Procedure: CYSTOSCOPY WITH RETROGRADE PYELOGRAM;  Surgeon: Malen Gauze, MD;  Location: [facility];  Service: Urology;  Laterality: Bilateral;   CYSTOSCOPY WITH RETROGRADE PYELOGRAM, URETEROSCOPY AND STENT PLACEMENT Bilateral 03/03/2022   Procedure: CYSTOSCOPY WITH RETROGRADE PYELOGRAM, URETEROSCOPY AND STENT PLACEMENT;  Surgeon: Malen Gauze, MD;  Location: [facility];  Service: Urology;  Laterality: Bilateral;   HOLMIUM LASER APPLICATION Right  05/02/2019   Procedure: HOLMIUM LASER APPLICATION;  Surgeon: Marcine Matar, MD;  Location: [facility];  Service: Urology;  Laterality: Right;   IR URETERAL STENT RIGHT NEW ACCESS W/O SEP NEPHROSTOMY CATH  05/02/2019   KNEE ARTHROSCOPY Right    LIVER BIOPSY  03/02/2015   Procedure: LAPAROSCOPIC LIVER BIOPSY;  Surgeon: Franky Macho, MD;  Location: [facility];  Service: General;;   NEPHROLITHOTOMY Right 05/02/2019   Procedure: NEPHROLITHOTOMY PERCUTANEOUS;  Surgeon: Marcine Matar, MD;  Location: [facility];  Service: Urology;  Laterality: Right;  3 HRS   THYROIDECTOMY     TONSILLECTOMY  1970   TUBAL LIGATION     Family History  Problem Relation Age of Onset   Leukemia Mother    Aneurysm Father    Heart attack Maternal Grandmother    Heart attack Maternal Grandfather    Social History   Socioeconomic History   Marital status: Widowed    Spouse name: Not on file   Number of children: Not on file   Years of education: Not on file   Highest education level: Not on file  Occupational History   Not on file  Tobacco Use   Smoking status: Never   Smokeless tobacco: Never  Vaping Use   Vaping status: Never Used  Substance and Sexual Activity   Alcohol use: No   Drug use: No   Sexual activity: Not Currently  Other Topics Concern   Not on file  Social History Narrative   Not on file   Social Drivers of Health   Financial Resource Strain: Not on file  Food Insecurity: No Food Insecurity (11/26/2022)   Hunger Vital Sign    Worried About Running Out of Food in the Last Year: Never true    Ran Out of Food in the Last Year: Never true  Transportation Needs: No Transportation Needs (11/26/2022)   PRAPARE - Administrator, Civil Service (Medical): No    Lack of Transportation (Non-Medical): No  Physical Activity: Not on file  Stress: Not on file  Social Connections: Not on file  Intimate Partner Violence: Not At Risk (11/26/2022)   Humiliation, Afraid, Rape, and Kick  questionnaire    Fear of Current or Ex-Partner: No    Emotionally Abused: No    Physically Abused: No    Sexually Abused: No    SUBJECTIVE  Review of Systems Constitutional: Patient denies any unintentional weight loss or change in strength lntegumentary: Patient denies any rashes or pruritus Cardiovascular: Patient denies chest pain or syncope Respiratory: Patient denies shortness of breath Gastrointestinal: Patient denies nausea, vomiting, constipation, or diarrhea Musculoskeletal: Patient denies muscle cramps or weakness Neurologic: Patient denies convulsions or seizures Allergic/Immunologic: Patient denies recent allergic reaction(s) Hematologic/Lymphatic: Patient denies bleeding tendencies Endocrine: Patient denies heat/cold intolerance  GU: As per HPI.  OBJECTIVE Vitals:   06/01/23 1007  BP: (!) 149/75  Pulse: 77  Temp: 98 F (36.7 C)  There is no height or weight on file to calculate BMI.  Physical Examination Constitutional: No obvious distress; patient is non-toxic appearing  Cardiovascular: No visible lower extremity edema.  Respiratory: The patient does not have audible wheezing/stridor; respirations do not appear labored  Gastrointestinal: Abdomen non-distended Musculoskeletal: Normal ROM of UEs  Skin: No obvious rashes/open sores  Neurologic: CN 2-12 grossly intact Psychiatric: Answered questions appropriately with normal affect  Hematologic/Lymphatic/Immunologic: No obvious bruises or sites of spontaneous bleeding  UA: 0-5 WBC/hpf, 0 RBC/hpf, many bacteria PVR: 0 ml  ASSESSMENT Overactive bladder - Plan: Urinalysis, Routine w reflex microscopic, BLADDER SCAN AMB NON-IMAGING  Kidney stones - Plan: Urinalysis, Routine w reflex microscopic, BLADDER SCAN AMB NON-IMAGING, DG Abd 1 View  Renal cyst - Plan: Urinalysis, Routine w reflex microscopic, BLADDER SCAN AMB NON-IMAGING  We reviewed recent imaging results; awaiting radiology results, appears to  have no acute findings per provider interpretation.  We discussed the symptoms of overactive bladder (OAB), which include urinary urgency, frequency, nocturia, with or without urge incontinence.   While we may not know the exact etiology of OAB, several risk factors can be identified.  - We discussed this patient's neurogenic risk factors for OAB-type symptoms including T2DM.  - Likely exacerbated by diuretic use.   We discussed the following management options in detail including potential benefits, risks, and side effects: Behavioral therapy: Modify fluid intake Decreasing bladder irritants (such as caffeine) Urge suppression strategies Bladder retraining / timed voiding Double voiding Medication(s): - For anticholinergic medications, we discussed the potential side effects of anticholinergics including dry eyes, dry mouth, constipation, cognitive impairment and urinary retention.  - For beta-3 agonist medication, we discussed the risk for urinary retention and the potential side effect of elevated blood pressure specific to Myrbetriq (which is more likely to occur in individuals with uncontrolled hypertension).  For refractory cases: PTNS (posterior tibial nerve stimulation) Sacral neuromodulation trial (Medtronic lnterStim or Axonics implant) Bladder Botox injections  She decided to continue with Myrbetriq 50 mg daily at this time.  Will plan to follow up in 6 months with KUB for stone surveillance or sooner if needed. Pt verbalized understanding and agreement. All questions were answered.  PLAN Advised the following: Continue Myrbetriq 50 mg daily. Minimize caffeine intake. Return in about 6 months (around 11/30/2023) for KUB, UA, & f/u with Evette Georges NP.  Orders Placed This Encounter  Procedures   DG Abd 1 View    Standing Status:   Future    Expected Date:   11/30/2023    Expiration Date:   05/31/2024    Reason for Exam (SYMPTOM  OR DIAGNOSIS REQUIRED):   kidney stone     Preferred imaging location?:   [facility]   Urinalysis, Routine w reflex microscopic   BLADDER SCAN AMB NON-IMAGING    It has been explained that the patient is to follow regularly with their PCP in addition to all other providers involved in their care and to follow instructions provided by these respective offices. Patient advised to contact urology clinic if any urologic-pertaining questions, concerns, new symptoms or problems arise in the interim period.  Patient Instructions      Overactive bladder (OAB) overview for patients:  Symptoms may include: urinary urgency ("gotta go" feeling) urinary frequency (voiding >8 times per day) night time urination (nocturia) urge incontinence of urine (UUI)  While we do not know the exact etiology of OAB, several treatment options exist including:  Behavioral therapy: Reducing fluid intake Decreasing bladder  stimulants (such as caffeine) and irritants (such as acidic food, spicy foods, alcohol) Urge suppression strategies Bladder retraining via timed voiding  Pelvic floor physical therapy  Medication(s) - can use one or both of the drug classes below. Anticholinergic / antimuscarinic medications:  Mechanism of action: Activate M3 receptors to reduce detrusor stimulation and increase bladder capacity   (parasympathetic nervous system). Effect: Relaxes the bladder to decrease overactivity, increase bladder storage capacity, and increase time between voids. Onset: Slow acting (may take 8-12 weeks to determine efficacy). Medications include: Vesicare (Solifenacin), Ditropan (Oxybutynin), Detrol (Tolterodine), Toviaz (Fesoterodine), Sanctura (Trospium), Urispas (Flavoxate), Enablex (Darifenacin), Bentyl (Dicyclomine), Levsin (Hyoscyamine ). Potential side effects include but are not limited to: Dry eyes, dry mouth, constipation, cognitive impairment, dementia risk with long term use, and urinary retention/ incomplete bladder  emptying. Insurance companies generally prefer for patients to try 1-2 anticholinergic / antimuscarinic medications first due to low cost. Some exceptions are made based on patient-specific comorbidities / risk factors. Beta-3 agonist medications: Mechanism of action: Stimulates selective B3 adrenergic receptors to cause smooth muscle bladder relaxation (sympathetic nervous system). Effect: Relaxes the bladder to decrease overactivity, increase bladder storage capacity, and increase time between voids. Onset: Slow acting (may take 8-12 weeks to determine efficacy). Medications include: Myrbetriq (Mirabegron) and Vibegron Leslye Peer). Potential side effects include but are not limited to: urinary retention / incomplete bladder emptying and elevated blood pressure (more likely to occur in individuals with pre-existing uncontrolled hypertension). These medications tend to be more expensive than the anticholinergic / antimuscarinic medications.   For patients with refractory OAB (if the above treatment options have been unsuccessful): Posterior tibial nerve stimulation (PTNS). Small acupuncture-type needle inserted near ankle with electric current to stimulate bladder via posterior tibial nerve pathway. Initially requires 12 weekly in-office treatments lasting 30 minutes each; followed by monthly in-office treatments lasting 30 minutes each for 1 year.  Bladder Botox injections. How it is done: Typically done via in-office cystoscopy; sometimes done in the OR depending on the situation. The bladder is numbed with lidocaine instilled via a catheter. Then the urologist injects Botox into the bladder muscle wall in about 20 locations. Causes local paralysis of the bladder muscle at the injection sites to reduce bladder muscle overactivity / spasms. The effect lasts for approximately 6 months and cannot be reversed once performed. Risks may included but are not limited to: infection, incomplete bladder  emptying/ urinary retention, short term need for self-catheterization or indwelling catheter, and need for repeat therapy. There is a 5-12% chance of needing to catheterize with Botox - that usually resolves in a few months as the Botox wears off. Typically Botox injections would need to be repeated every 3-12 months since this is not a permanent therapy.  Sacral neuromodulation trial (Medtronic lnterStim or Axonics implant). Sacral neuromodulation is FDA-approved for uncontrolled urinary urgency, urinary frequency, urinary urge incontinence, non-obstructive urinary retention, or fecal incontinence. It is not FDA-approved as a treatment for pain. The goal of this therapy is at least a 50% improvement in symptoms. It is NOT realistic to expect a 100% cure. This is a a 2-step outpatient procedure. After a successful test period, a permanent wire and generator are placed in the OR. We discussed the risk of infection. We reviewed the fact that about 30% of patients fail the test phase and are not candidates for permanent generator placement. During the 1-2 week trial phase, symptoms are documented by the patient to determine response. If patient gets at least a 50% improvement  in symptoms, they may then proceed with Step 2. Step 1: Trial lead placement. Per physician discretion, may done one of two ways: Percutaneous nerve evaluation (PNE) in the [facility] urology office. Performed by urologist under local anesthesia (numbing the area with lidocaine) using a spinal needle for placement of test wire, which usually stays in place for 5-7 days to determine therapy response. Test lead placement in OR under anesthesia. Usually stays in place 2 weeks to determine therapy response. > Step 2: Permanent implantation of sacral neuromodulation device, which is performed in the OR.  Sacral neuromodulation implants: All are conditionally MRI safe. Manufacturer: Medtronic Website:  [employer].gl therapy/right-for-you.html Options: lnterStim X: Non-rechargeable. The battery lasts 10 years on average. lnterStim Micro: Rechargeable. The battery lasts 15 years on average and must be charged routinely. Approximately 50% smaller implant than lnterStim X implant.  Manufacturer: Axonics Website: Findrealrelief.axonics.com Options: Non-rechargeable (Axonics F15): The battery lasts 15 years on average. Rechargeable (Axonics R20): The battery lasts 20 years on average and must be charged in office for about 1 hour every 6-10 months on average. Approximately 50% smaller implant than Axonics non-rechargeable implant.  Note: Generally the rechargeable devices are only advised for very small or thin patients who may not have sufficient adipose tissue to comfortably overlay the implanted device.  Suprapubic catheter (SP tube) placement. Only done in severely refractory OAB when all other options have failed or are not a viable treatment choice depending on patient factors. Involves placement of a catheter through the lower abdomen into the bladder to continuously drain the bladder into an external collection bag, which patient can then empty at their convenience every few hours. Done via an outpatient surgical procedure in the OR under anesthesia. Risks may included but are not limited to: surgical site pain, infections, skin irritation / breakdown, chronic bacteriuria, symptomatic UTls. The SP tube must stay in place continuously. This is a reversible procedure however - the insertion site will close if catheter is removed for more than a few hours. The SP tube must be exchanged routinely every 4 weeks to prevent the catheter from becoming clogged with sediment. SP tube exchanges are typically performed at a urology nurse visit or by a home health nurse.   Electronically signed by:  Donnita Falls,  MSN, FNP-C, CUNP 06/01/2023 10:49 AM

## 2023-05-31 ENCOUNTER — Telehealth: Payer: Self-pay

## 2023-05-31 NOTE — Telephone Encounter (Signed)
Tried calling patient with no answer. Left voiced message to make patient aware to get KUB before scheduled appointment.

## 2023-06-01 ENCOUNTER — Ambulatory Visit (HOSPITAL_COMMUNITY)
Admission: RE | Admit: 2023-06-01 | Discharge: 2023-06-01 | Disposition: A | Discharge status: Home / Self Care | Payer: PPO | Source: Ambulatory Visit | Attending: Urology | Admitting: Urology

## 2023-06-01 ENCOUNTER — Ambulatory Visit: Payer: PPO | Admitting: Urology

## 2023-06-01 ENCOUNTER — Encounter: Payer: Self-pay | Admitting: Urology

## 2023-06-01 VITALS — BP 149/75 | HR 77 | Temp 98.0°F

## 2023-06-01 DIAGNOSIS — N281 Cyst of kidney, acquired: Secondary | ICD-10-CM | POA: Diagnosis not present

## 2023-06-01 DIAGNOSIS — I878 Other specified disorders of veins: Secondary | ICD-10-CM | POA: Diagnosis not present

## 2023-06-01 DIAGNOSIS — N3281 Overactive bladder: Secondary | ICD-10-CM

## 2023-06-01 DIAGNOSIS — N200 Calculus of kidney: Secondary | ICD-10-CM

## 2023-06-01 DIAGNOSIS — Z87442 Personal history of urinary calculi: Secondary | ICD-10-CM

## 2023-06-01 LAB — URINALYSIS, ROUTINE W REFLEX MICROSCOPIC
Bilirubin, UA: NEGATIVE
Glucose, UA: NEGATIVE
Ketones, UA: NEGATIVE
Nitrite, UA: POSITIVE — AB
Protein,UA: NEGATIVE
RBC, UA: NEGATIVE
Specific Gravity, UA: 1.02 (ref 1.005–1.030)
Urobilinogen, Ur: 0.2 mg/dL (ref 0.2–1.0)
pH, UA: 6 (ref 5.0–7.5)

## 2023-06-01 LAB — MICROSCOPIC EXAMINATION: RBC, Urine: NONE SEEN /[HPF] (ref 0–2)

## 2023-06-01 NOTE — Patient Instructions (Signed)
Overactive bladder (OAB) overview for patients:  Symptoms may include: urinary urgency ("gotta go" feeling) urinary frequency (voiding >8 times per day) night time urination (nocturia) urge incontinence of urine (UUI)  While we do not know the exact etiology of OAB, several treatment options exist including:  Behavioral therapy: Reducing fluid intake Decreasing bladder stimulants (such as caffeine) and irritants (such as acidic food, spicy foods, alcohol) Urge suppression strategies Bladder retraining via timed voiding  Pelvic floor physical therapy  Medication(s) - can use one or both of the drug classes below. Anticholinergic / antimuscarinic medications:  Mechanism of action: Activate M3 receptors to reduce detrusor stimulation and increase bladder capacity  (parasympathetic nervous system). Effect: Relaxes the bladder to decrease overactivity, increase bladder storage capacity, and increase time between voids. Onset: Slow acting (may take 8-12 weeks to determine efficacy). Medications include: Vesicare (Solifenacin), Ditropan (Oxybutynin), Detrol (Tolterodine), Toviaz (Fesoterodine), Sanctura (Trospium), Urispas (Flavoxate), Enablex (Darifenacin), Bentyl (Dicyclomine), Levsin (Hyoscyamine ). Potential side effects include but are not limited to: Dry eyes, dry mouth, constipation, cognitive impairment, dementia risk with long term use, and urinary retention/ incomplete bladder emptying. Insurance companies generally prefer for patients to try 1-2 anticholinergic / antimuscarinic medications first due to low cost. Some exceptions are made based on patient-specific comorbidities / risk factors. Beta-3 agonist medications: Mechanism of action: Stimulates selective B3 adrenergic receptors to cause smooth muscle bladder relaxation (sympathetic nervous system). Effect: Relaxes the bladder to decrease overactivity, increase bladder storage capacity, and increase time  between voids. Onset: Slow acting (may take 8-12 weeks to determine efficacy). Medications include: Myrbetriq (Mirabegron) and Vibegron Patricia [name]). Potential side effects include but are not limited to: urinary retention / incomplete bladder emptying and elevated blood pressure (more likely to occur in individuals with pre-existing uncontrolled hypertension). These medications tend to be more expensive than the anticholinergic / antimuscarinic medications.   For patients with refractory OAB (if the above treatment options have been unsuccessful): Posterior tibial nerve stimulation (PTNS). Small acupuncture-type needle inserted near ankle with electric current to stimulate bladder via posterior tibial nerve pathway. Initially requires 12 weekly in-office treatments lasting 30 minutes each; followed by monthly in-office treatments lasting 30 minutes each for 1 year.  Bladder Botox injections. How it is done: Typically done via in-office cystoscopy; sometimes done in the OR depending on the situation. The bladder is numbed with lidocaine instilled via a catheter. Then the urologist injects Botox into the bladder muscle wall in about 20 locations. Causes local paralysis of the bladder muscle at the injection sites to reduce bladder muscle overactivity / spasms. The effect lasts for approximately 6 months and cannot be reversed once performed. Risks may included but are not limited to: infection, incomplete bladder emptying/ urinary retention, short term need for self-catheterization or indwelling catheter, and need for repeat therapy. There is a 5-12% chance of needing to catheterize with Botox - that usually resolves in a few months as the Botox wears off. Typically Botox injections would need to be repeated every 3-12 months since this is not a permanent therapy.  Sacral neuromodulation trial (Medtronic lnterStim or Axonics implant). Sacral neuromodulation is FDA-approved for uncontrolled urinary  urgency, urinary frequency, urinary urge incontinence, non-obstructive urinary retention, or fecal incontinence. It is not FDA-approved as a treatment for pain. The goal of this therapy is at least a 50% improvement in symptoms. It is NOT realistic to expect a 100% cure. This is a a 2-step outpatient procedure. After a successful  test period, a permanent wire and generator are placed in the OR. We discussed the risk of infection. We reviewed the fact that about 30% of patients fail the test phase and are not candidates for permanent generator placement. During the 1-2 week trial phase, symptoms are documented by the patient to determine response. If patient gets at least a 50% improvement in symptoms, they may then proceed with Step 2. Step 1: Trial lead placement. Per physician discretion, may done one of two ways: Percutaneous nerve evaluation (PNE) in the [facility] urology office. Performed by urologist under local anesthesia (numbing the area with lidocaine) using a spinal needle for placement of test wire, which usually stays in place for 5-7 days to determine therapy response. Test lead placement in OR under anesthesia. Usually stays in place 2 weeks to determine therapy response. > Step 2: Permanent implantation of sacral neuromodulation device, which is performed in the OR.  Sacral neuromodulation implants: All are conditionally MRI safe. Manufacturer: Medtronic Website: [employer] therapy/right-for-you.html Options: lnterStim X: Non-rechargeable. The battery lasts 10 years on average. lnterStim Micro: Rechargeable. The battery lasts 15 years on average and must be charged routinely. Approximately 50% smaller implant than lnterStim X implant.  Manufacturer: Axonics Website: Findrealrelief.axonics.com Options: Non-rechargeable (Axonics F15): The battery lasts 15 years on average. Rechargeable (Axonics R20):  The battery lasts 20 years on average and must be charged in office for about 1 hour every 6-10 months on average. Approximately 50% smaller implant than Axonics non-rechargeable implant.  Note: Generally the rechargeable devices are only advised for very small or thin patients who may not have sufficient adipose tissue to comfortably overlay the implanted device.  Suprapubic catheter (SP tube) placement. Only done in severely refractory OAB when all other options have failed or are not a viable treatment choice depending on patient factors. Involves placement of a catheter through the lower abdomen into the bladder to continuously drain the bladder into an external collection bag, which patient can then empty at their convenience every few hours. Done via an outpatient surgical procedure in the OR under anesthesia. Risks may included but are not limited to: surgical site pain, infections, skin irritation / breakdown, chronic bacteriuria, symptomatic UTls. The SP tube must stay in place continuously. This is a reversible procedure however - the insertion site will close if catheter is removed for more than a few hours. The SP tube must be exchanged routinely every 4 weeks to prevent the catheter from becoming clogged with sediment. SP tube exchanges are typically performed at a urology nurse visit or by a home health nurse.

## 2023-06-09 ENCOUNTER — Telehealth: Payer: Self-pay

## 2023-06-09 ENCOUNTER — Ambulatory Visit (INDEPENDENT_AMBULATORY_CARE_PROVIDER_SITE_OTHER): Payer: PPO

## 2023-06-09 ENCOUNTER — Encounter: Payer: Self-pay | Admitting: Internal Medicine

## 2023-06-09 ENCOUNTER — Ambulatory Visit: Payer: PPO | Attending: Internal Medicine | Admitting: Internal Medicine

## 2023-06-09 ENCOUNTER — Emergency Department (HOSPITAL_COMMUNITY)
Admission: EM | Admit: 2023-06-09 | Discharge: 2023-06-09 | Disposition: A | Discharge status: Home / Self Care | Payer: PPO | Attending: Emergency Medicine | Admitting: Emergency Medicine

## 2023-06-09 ENCOUNTER — Other Ambulatory Visit: Payer: Self-pay

## 2023-06-09 ENCOUNTER — Other Ambulatory Visit (HOSPITAL_COMMUNITY)
Admission: RE | Admit: 2023-06-09 | Discharge: 2023-06-09 | Disposition: A | Discharge status: Home / Self Care | Payer: PPO | Source: Ambulatory Visit | Attending: Internal Medicine | Admitting: Internal Medicine

## 2023-06-09 ENCOUNTER — Ambulatory Visit
Admission: EM | Admit: 2023-06-09 | Discharge: 2023-06-09 | Disposition: A | Discharge status: Home / Self Care | Payer: PPO | Attending: Nurse Practitioner | Admitting: Nurse Practitioner

## 2023-06-09 ENCOUNTER — Encounter (HOSPITAL_COMMUNITY): Payer: Self-pay

## 2023-06-09 VITALS — BP 122/58 | HR 86 | Ht 64.0 in | Wt 201.0 lb

## 2023-06-09 DIAGNOSIS — R051 Acute cough: Secondary | ICD-10-CM

## 2023-06-09 DIAGNOSIS — R059 Cough, unspecified: Secondary | ICD-10-CM

## 2023-06-09 DIAGNOSIS — E119 Type 2 diabetes mellitus without complications: Secondary | ICD-10-CM | POA: Diagnosis not present

## 2023-06-09 DIAGNOSIS — R0609 Other forms of dyspnea: Secondary | ICD-10-CM

## 2023-06-09 DIAGNOSIS — Z7984 Long term (current) use of oral hypoglycemic drugs: Secondary | ICD-10-CM | POA: Insufficient documentation

## 2023-06-09 DIAGNOSIS — I10 Essential (primary) hypertension: Secondary | ICD-10-CM | POA: Diagnosis not present

## 2023-06-09 DIAGNOSIS — U071 COVID-19: Secondary | ICD-10-CM | POA: Diagnosis not present

## 2023-06-09 LAB — POC COVID19/FLU A&B COMBO
Covid Antigen, POC: POSITIVE — AB
Influenza A Antigen, POC: NEGATIVE
Influenza B Antigen, POC: NEGATIVE

## 2023-06-09 LAB — D-DIMER, QUANTITATIVE: D-Dimer, Quant: 0.81 ug{FEU}/mL — ABNORMAL HIGH (ref 0.00–0.50)

## 2023-06-09 MED ORDER — PREDNISONE 10 MG PO TABS: 40.0000 mg | ORAL_TABLET | Freq: Every day | ORAL | 0 refills | Status: AC

## 2023-06-09 MED ORDER — DOXYCYCLINE HYCLATE 100 MG PO TABS: 100.0000 mg | ORAL_TABLET | Freq: Two times a day (BID) | ORAL | 0 refills | Status: DC

## 2023-06-09 MED ORDER — IPRATROPIUM-ALBUTEROL 0.5-2.5 (3) MG/3ML IN SOLN
3.0000 mL | Freq: Once | RESPIRATORY_TRACT | Status: AC
  Administered 2023-06-09: 3 mL via RESPIRATORY_TRACT

## 2023-06-09 MED ORDER — METHYLPREDNISOLONE SODIUM SUCC 125 MG IJ SOLR
80.0000 mg | Freq: Once | INTRAMUSCULAR | Status: AC
  Administered 2023-06-09: 80 mg via INTRAMUSCULAR

## 2023-06-09 MED ORDER — PROMETHAZINE-DM 6.25-15 MG/5ML PO SYRP: 5.0000 mL | ORAL_SOLUTION | Freq: Four times a day (QID) | ORAL | 0 refills | Status: DC | PRN

## 2023-06-09 MED ORDER — BENZONATATE 100 MG PO CAPS
100.0000 mg | ORAL_CAPSULE | Freq: Once | ORAL | Status: AC
  Administered 2023-06-09: 100 mg via ORAL
  Filled 2023-06-09: qty 1

## 2023-06-09 MED ORDER — PAXLOVID (300/100) 20 X 150 MG & 10 X 100MG PO TBPK: 3.0000 | ORAL_TABLET | Freq: Two times a day (BID) | ORAL | 0 refills | Status: AC

## 2023-06-09 MED ORDER — IPRATROPIUM-ALBUTEROL 0.5-2.5 (3) MG/3ML IN SOLN
3.0000 mL | Freq: Once | RESPIRATORY_TRACT | Status: AC
  Administered 2023-06-09: 3 mL via RESPIRATORY_TRACT
  Filled 2023-06-09: qty 3

## 2023-06-09 MED ORDER — PREDNISONE 20 MG PO TABS: 40.0000 mg | ORAL_TABLET | Freq: Every day | ORAL | 0 refills | Status: DC

## 2023-06-09 NOTE — ED Provider Notes (Signed)
[place] EMERGENCY DEPARTMENT AT [facility] Provider Note   CSN: [id number] Arrival date & time: 06/09/23  1742     History  Chief Complaint  Patient presents with   Cough    Patricia [name] is a 72 y.o. female.  With a history of diabetes, hypertension and dyspnea on exertion who presents the ED for coughing.  Dry cough and shortness of breath with exertion have been present for 5 days.  Tested positive for COVID at urgent care today.  Was noted to be hypoxic at urgent care and sent here for further evaluation.  Received steroids 2 breathing treatments at urgent care but was in high 80s on room air.  No home O2.  Denies chest pain fevers chills nasal congestion and GI symptoms.  No history of smoking, COPD or asthma however she does have albuterol MDI's she uses some time for coughing   Cough      Home Medications Prior to Admission medications   Medication Sig Start Date End Date Taking? Authorizing Provider  nirmatrelvir/ritonavir (PAXLOVID, 300/100,) 20 x 150 MG & 10 x 100MG  TBPK Take 3 tablets by mouth 2 (two) times daily for 5 days. Patient GFR is 60. Take nirmatrelvir (150 mg) two tablets twice daily for 5 days and ritonavir (100 mg) one tablet twice daily for 5 days. 06/09/23 06/14/23 Yes Royanne Foots, DO  predniSONE (DELTASONE) 10 MG tablet Take 4 tablets (40 mg total) by mouth daily for 3 days. 06/09/23 06/12/23 Yes Royanne Foots, DO  albuterol (VENTOLIN HFA) 108 (90 Base) MCG/ACT inhaler Inhale 2 puffs into the lungs every 4 (four) hours as needed for shortness of breath. 08/22/22   [provider]  APPLE CIDER VINEGAR PO Take 1 tablet by mouth daily.    [provider]  cetirizine (ZYRTEC) 10 MG tablet Take 10 mg by mouth at bedtime.    [provider]  CINNAMON PO Take 1,000 mg by mouth daily.    [provider]  cyclobenzaprine (FLEXERIL) 5 MG tablet Take 1 tablet (5 mg total) by mouth 3 (three) times daily as  needed for muscle spasms. 09/14/22   McKenzie, Mardene Celeste, MD  escitalopram (LEXAPRO) 5 MG tablet Take 5 mg by mouth daily. 07/17/19   [provider]  furosemide (LASIX) 20 MG tablet Take 1 tablet (20 mg total) by mouth daily as needed (Shortness of breath). 03/17/23   Mallipeddi, Vishnu P, MD  levothyroxine (SYNTHROID) 200 MCG tablet Take 200 mcg by mouth daily. 02/15/23   [provider]  Melatonin 10 MG CAPS Take 10 mg by mouth at bedtime as needed (sleep).    [provider]  meloxicam (MOBIC) 15 MG tablet Take 15 mg by mouth as needed for pain.    [provider]  metFORMIN (GLUCOPHAGE) 500 MG tablet Take 500 mg by mouth 2 (two) times daily. 11/08/21   [provider]  mirabegron ER (MYRBETRIQ) 50 MG TB24 tablet Take 1 tablet (50 mg total) by mouth daily. 10/26/22   McKenzie, Mardene Celeste, MD  Omega-3 Fatty Acids (FISH OIL) 1000 MG CAPS Take 1 capsule by mouth daily.    [provider]  pregabalin (LYRICA) 300 MG capsule Take 300 mg by mouth 2 (two) times daily. 01/18/22   [provider]  simvastatin (ZOCOR) 20 MG tablet Take 20 mg by mouth daily.    [provider]  TIADYLT ER 360 MG 24 hr capsule Take 360 mg by mouth daily.  11/28/21   [provider]      Allergies    Amlodipine and Keflex [cephalexin]    Review of Systems   Review of Systems  Respiratory:  Positive for cough.     Physical Exam Updated Vital Signs BP (!) 116/59   Pulse 75   Temp 98.5 F (36.9 C)   Resp 18   Ht 5\' 4"  (1.626 m)   Wt 90.7 kg   SpO2 91%   BMI 34.33 kg/m  Physical Exam Vitals and nursing note reviewed.  HENT:     Head: Normocephalic and atraumatic.  Eyes:     Pupils: Pupils are equal, round, and reactive to light.  Cardiovascular:     Rate and Rhythm: Normal rate and regular rhythm.  Pulmonary:     Effort: Pulmonary effort is normal.     Breath sounds: Normal breath sounds.  Abdominal:     Palpations: Abdomen is soft.      Tenderness: There is no abdominal tenderness.  Skin:    General: Skin is warm and dry.  Neurological:     Mental Status: She is alert.  Psychiatric:        Mood and Affect: Mood normal.     ED Results / Procedures / Treatments   Labs (all labs ordered are listed, but only abnormal results are displayed) Labs Reviewed - No data to display  EKG None  Radiology DG Chest 2 View Result Date: 06/09/2023 CLINICAL DATA:  Cough for 5 days EXAM: CHEST - 2 VIEW COMPARISON:  08/22/2022 FINDINGS: Central airways thickening. No focal consolidation, pleural effusion or pneumothorax. Normal cardiac size. IMPRESSION: Central airways thickening suggesting viral process or reactive airways. No focal pneumonia. Electronically Signed   By: Jasmine Pang M.D.   On: 06/09/2023 18:43    Procedures Procedures    Medications Ordered in ED Medications  benzonatate (TESSALON) capsule 100 mg (100 mg Oral Given 06/09/23 1950)  ipratropium-albuterol (DUONEB) 0.5-2.5 (3) MG/3ML nebulizer solution 3 mL (3 mLs Nebulization Given 06/09/23 2026)    ED Course/ Medical Decision Making/ A&P Clinical Course as of 06/09/23 2313  Fri Jun 09, 2023  2307 Patient reports improvement in her breathing after DuoNeb treatment.  Stable in mid 90s on room air.  Able to sit up on her own without issue.  Appropriate for discharge with close PCP follow-up.  Will discharge with Paxil bid, for course of prednisone and close PCP follow-up.  She has albuterol MDIs at home.  Return precaution discussed with her and her daughter in detail [MP]    Clinical Course User Index [MP] Royanne Foots, DO                                 Medical Decision Making 72 year old female with history as above presenting for coughing shortness of breath in the setting of acute COVID infection.  Tested positive for COVID at urgent care.  Chest x-ray showed viral pattern but no focal consolidation earlier today.  Received 2 DuoNeb treatments  along with steroids but was still hypoxic and sent here for further evaluation.  No home O2.  On my initial assessment she is tachypneic with oxygen saturation in the high 80s on room air.  Started on 3 L and will try another DuoNeb treatment.  Most likely in the setting of acute COVID but if she is requiring O2 after her third nebulized albuterol treatment she may need  to come in for new oxygen requirement in the setting of acute COVID.  Risk Prescription drug management.           Final Clinical Impression(s) / ED Diagnoses Final diagnoses:  COVID  Acute cough    Rx / DC Orders ED Discharge Orders          Ordered    nirmatrelvir/ritonavir (PAXLOVID, 300/100,) 20 x 150 MG & 10 x 100MG  TBPK  2 times daily        06/09/23 2312    predniSONE (DELTASONE) 10 MG tablet  Daily        06/09/23 2312              Royanne Foots, DO 06/09/23 2314

## 2023-06-09 NOTE — ED Notes (Signed)
Patient is being discharged from the Urgent Care and sent to the Emergency Department via private vehicle . Per CLeath-Warren NP, patient is in need of higher level of care due to [facility] Sat, Covid. Patient is aware and verbalizes understanding of plan of care.  Vitals:   06/09/23 1602 06/09/23 1723  BP:    Pulse:  91  Resp:  20  Temp:    SpO2: (!) 88% (!) 89%

## 2023-06-09 NOTE — ED Provider Notes (Addendum)
[facility]    CSN: [id number] Arrival date & time: 06/09/23  1515      History   Chief Complaint No chief complaint on file.   HPI Patricia [name] is a 72 y.o. female.   The history is provided by the patient and a relative (Daughter).   The patient presents with her daughter that is been present for the past 5 days.  Patient denies fever, chills, headache, nasal congestion, runny nose, difficulty breathing, abdominal pain, nausea, vomiting, diarrhea, or rash.  Patient is currently under the care of cardiology and pulmonology for shortness of breath and cough.  Patient states that her cough does not accompany her shortness of breath.  Daughter states she has seen pulmonology in the past and was diagnosed with GERD.  Reports patient is scheduled to return to see pulmonology for a pulmonary function test.  Patient states that she does have an albuterol inhaler, and has had to use that for coughing episodes.  Also states she has been taking over-the-counter cough medicine with minimal relief.  Baseline O2 sats at 88%.  Past Medical History:  Diagnosis Date   Arthritis    Diabetes mellitus without complication (HCC)    Hypertension    Hypothyroidism    PONV (postoperative nausea and vomiting)     Patient Active Problem List   Diagnosis Date Noted   OSA (obstructive sleep apnea) 03/17/2023   Upper airway cough syndrome 12/05/2022   Renal cyst 11/30/2022   Overactive bladder 11/30/2022   Urinary urgency 11/30/2022   Urge incontinence 11/30/2022   Urinary frequency 11/30/2022   Dehydration 11/26/2022   Diarrhea 11/26/2022   Leukocytosis 11/26/2022   Type 2 diabetes mellitus with hyperglycemia (HCC) 11/26/2022   DOE (dyspnea on exertion) 10/14/2022   Hyperkalemia 02/02/2022   Hypothyroidism 02/02/2022   Essential hypertension 02/02/2022   Gastroesophageal reflux disease without esophagitis 08/16/2021   Positive occult stool blood test 08/16/2021   Kidney  stones 05/02/2019   Pain in thoracic spine 12/26/2013   Lumbago 12/26/2013   Stiffness of joint, not elsewhere classified, pelvic region and thigh 12/26/2013   Abnormality of gait 12/26/2013    Past Surgical History:  Procedure Laterality Date   ABDOMINAL HYSTERECTOMY     CHOLECYSTECTOMY N/A 03/02/2015   Procedure: LAPAROSCOPIC CHOLECYSTECTOMY;  Surgeon: Franky Macho, MD;  Location: [facility];  Service: General;  Laterality: N/A;   CYSTOSCOPY W/ URETERAL STENT PLACEMENT Bilateral 02/02/2022   Procedure: CYSTOSCOPY WITH RETROGRADE PYELOGRAM;  Surgeon: Malen Gauze, MD;  Location: [facility];  Service: Urology;  Laterality: Bilateral;   CYSTOSCOPY WITH RETROGRADE PYELOGRAM, URETEROSCOPY AND STENT PLACEMENT Bilateral 03/03/2022   Procedure: CYSTOSCOPY WITH RETROGRADE PYELOGRAM, URETEROSCOPY AND STENT PLACEMENT;  Surgeon: Malen Gauze, MD;  Location: [facility];  Service: Urology;  Laterality: Bilateral;   HOLMIUM LASER APPLICATION Right 05/02/2019   Procedure: HOLMIUM LASER APPLICATION;  Surgeon: Marcine Matar, MD;  Location: [facility];  Service: Urology;  Laterality: Right;   IR URETERAL STENT RIGHT NEW ACCESS W/O SEP NEPHROSTOMY CATH  05/02/2019   KNEE ARTHROSCOPY Right    LIVER BIOPSY  03/02/2015   Procedure: LAPAROSCOPIC LIVER BIOPSY;  Surgeon: Franky Macho, MD;  Location: [facility];  Service: General;;   NEPHROLITHOTOMY Right 05/02/2019   Procedure: NEPHROLITHOTOMY PERCUTANEOUS;  Surgeon: Marcine Matar, MD;  Location: [facility];  Service: Urology;  Laterality: Right;  3 HRS   THYROIDECTOMY     TONSILLECTOMY  1970   TUBAL LIGATION      OB  History   No obstetric history on file.      Home Medications    Prior to Admission medications   Medication Sig Start Date End Date Taking? Authorizing Provider  albuterol (VENTOLIN HFA) 108 (90 Base) MCG/ACT inhaler Inhale 2 puffs into the lungs every 4 (four) hours as needed for shortness of breath. 08/22/22   [provider]   APPLE CIDER VINEGAR PO Take 1 tablet by mouth daily.    [provider]  cetirizine (ZYRTEC) 10 MG tablet Take 10 mg by mouth at bedtime.    [provider]  CINNAMON PO Take 1,000 mg by mouth daily.    [provider]  cyclobenzaprine (FLEXERIL) 5 MG tablet Take 1 tablet (5 mg total) by mouth 3 (three) times daily as needed for muscle spasms. 09/14/22   McKenzie, Mardene Celeste, MD  escitalopram (LEXAPRO) 5 MG tablet Take 5 mg by mouth daily. 07/17/19   [provider]  furosemide (LASIX) 20 MG tablet Take 1 tablet (20 mg total) by mouth daily as needed (Shortness of breath). 03/17/23   Mallipeddi, Vishnu P, MD  levothyroxine (SYNTHROID) 200 MCG tablet Take 200 mcg by mouth daily. 02/15/23   [provider]  Melatonin 10 MG CAPS Take 10 mg by mouth at bedtime as needed (sleep).    [provider]  meloxicam (MOBIC) 15 MG tablet Take 15 mg by mouth as needed for pain.    [provider]  metFORMIN (GLUCOPHAGE) 500 MG tablet Take 500 mg by mouth 2 (two) times daily. 11/08/21   [provider]  mirabegron ER (MYRBETRIQ) 50 MG TB24 tablet Take 1 tablet (50 mg total) by mouth daily. 10/26/22   McKenzie, Mardene Celeste, MD  Omega-3 Fatty Acids (FISH OIL) 1000 MG CAPS Take 1 capsule by mouth daily.    [provider]  pregabalin (LYRICA) 300 MG capsule Take 300 mg by mouth 2 (two) times daily. 01/18/22   [provider]  simvastatin (ZOCOR) 20 MG tablet Take 20 mg by mouth daily.    [provider]  TIADYLT ER 360 MG 24 hr capsule Take 360 mg by mouth daily. 11/28/21   [provider]    Family History Family History  Problem Relation Age of Onset   Leukemia Mother    Aneurysm Father    Heart attack Maternal Grandmother    Heart attack Maternal Grandfather     Social History Social History   Tobacco Use   Smoking status: Never   Smokeless tobacco: Never  Vaping Use   Vaping status: Never Used   Substance Use Topics   Alcohol use: No   Drug use: No     Allergies   Amlodipine and Keflex [cephalexin]   Review of Systems Review of Systems Per HPI  Physical Exam Triage Vital Signs ED Triage Vitals  Encounter Vitals Group     BP 06/09/23 1550 105/64     Systolic BP Percentile --      Diastolic BP Percentile --      Pulse Rate 06/09/23 1550 93     Resp 06/09/23 1550 (!) 22     Temp 06/09/23 1550 99.6 F (37.6 C)     Temp Source 06/09/23 1550 Oral     SpO2 06/09/23 1550 (!) 85 %     Weight --      Height --      Head Circumference --      Peak Flow --  Pain Score 06/09/23 1553 0     Pain Loc --      Pain Education --      Exclude from Growth Chart --    No data found.  Updated Vital Signs BP 105/64 (BP Location: Right Arm)   Pulse 91   Temp 99.6 F (37.6 C) (Oral)   Resp 20   SpO2 (!) 89%   Visual Acuity Right Eye Distance:   Left Eye Distance:   Bilateral Distance:    Right Eye Near:   Left Eye Near:    Bilateral Near:     Physical Exam Vitals and nursing note reviewed.  Constitutional:      General: She is not in acute distress.    Appearance: Normal appearance.  HENT:     Head: Normocephalic.     Right Ear: Tympanic membrane, ear canal and external ear normal.     Left Ear: Tympanic membrane, ear canal and external ear normal.     Nose: Nose normal.     Mouth/Throat:     Mouth: Mucous membranes are moist.  Eyes:     Extraocular Movements: Extraocular movements intact.     Conjunctiva/sclera: Conjunctivae normal.     Pupils: Pupils are equal, round, and reactive to light.  Cardiovascular:     Rate and Rhythm: Normal rate and regular rhythm.     Pulses: Normal pulses.     Heart sounds: Normal heart sounds.  Pulmonary:     Comments: On initial evaluation, lung sounds were clear throughout.  Patient taken for chest x-ray experienced a coughing episode that lasted approximately 10 minutes.  Cough sounded "deep" and patient had a  difficult time catching her breath.  Albuterol inhaler inhaler, 2 puffs was initially administered, coughing episode would not cease.  DuoNeb and Solu-Medrol 80 mg IM administered.  Patient was able to complete chest x-ray afterwards.  O2 sats increased during DuoNeb as high as 96%.  Shortly after DuoNeb was completed, sats back down to 89%. Abdominal:     General: Bowel sounds are normal.     Palpations: Abdomen is soft.     Tenderness: There is no abdominal tenderness.  Musculoskeletal:     Cervical back: Normal range of motion.  Lymphadenopathy:     Cervical: No cervical adenopathy.  Skin:    General: Skin is warm and dry.  Neurological:     General: No focal deficit present.     Mental Status: She is alert and oriented to person, place, and time.  Psychiatric:        Mood and Affect: Mood normal.        Behavior: Behavior normal.      UC Treatments / Results  Labs (all labs ordered are listed, but only abnormal results are displayed) Labs Reviewed  POC COVID19/FLU A&B COMBO - Abnormal; Notable for the following components:      Result Value   Covid Antigen, POC Positive (*)    All other components within normal limits    EKG   Radiology No results found.  Procedures Procedures (including critical care time)  Medications Ordered in UC Medications  methylPREDNISolone sodium succinate (SOLU-MEDROL) 125 mg/2 mL injection 80 mg (has no administration in time range)  ipratropium-albuterol (DUONEB) 0.5-2.5 (3) MG/3ML nebulizer solution 3 mL (3 mLs Nebulization Given 06/09/23 1701)  ipratropium-albuterol (DUONEB) 0.5-2.5 (3) MG/3ML nebulizer solution 3 mL (3 mLs Nebulization Given 06/09/23 1713)    Initial Impression / Assessment and Plan / UC Course  I have reviewed the triage vital signs and the nursing notes.  Pertinent labs & imaging results that were available during my care of the patient were reviewed by me and considered in my medical decision making (see chart  for details).  Chest x-ray is pending.  Patient was given albuterol inhaler 2 puffs, 2 DuoNebs, and Solu-Medrol 80 mg IM.  Coughing episode experienced during visit finally ceased.  Patient with positive COVID test which is most likely causing her symptoms.  Given continued low O2 sats, it is advised that patient go to the emergency department for further evaluation.  Family member and patient were notified of same and agreed with recommendation.  Patient remained at 89 to 90% on room air at discharge.  Patient is not in any distress, patient is able to travel via private vehicle.  Patient and family verbalized understanding.  Patient discharged to the emergency department.  Final Clinical Impressions(s) / UC Diagnoses   Final diagnoses:  Cough, unspecified type  COVID     Discharge Instructions      Go to the emergency department for further evaluation.     ED Prescriptions     Medication Sig Dispense Auth. Provider   predniSONE (DELTASONE) 20 MG tablet Take 2 tablets (40 mg total) by mouth daily with breakfast for 5 days. 10 tablet Leath-Warren, Sadie Haber, NP   promethazine-dextromethorphan (PROMETHAZINE-DM) 6.25-15 MG/5ML syrup Take 5 mLs by mouth 4 (four) times daily as needed. 118 mL Leath-Warren, Sadie Haber, NP   doxycycline (VIBRA-TABS) 100 MG tablet Take 1 tablet (100 mg total) by mouth 2 (two) times daily for 7 days. 14 tablet Leath-Warren, Sadie Haber, NP      PDMP not reviewed this encounter.   Abran Cantor, NP 06/09/23 1725    Abran Cantor, NP 06/09/23 1736

## 2023-06-09 NOTE — Discharge Instructions (Addendum)
You were seen in the emergency department for shortness of breath related to acute COVID infection Your breathing improved after another DuoNeb treatment here and you were stable on room oxygen We have called in a prescription for Paxlovid for you to pick up from your pharmacy and begin taking as directed for the next 5 days We have also called in a prescription for prednisone for you to take as directed for the next 3 days to help with your breathing You can use your albuterol metered-dose inhaler as directed for trouble breathing Follow-up with your primary care doctor 1 week for reevaluation Return to the Emergency Department for trouble breathing chest pain or any other concerns

## 2023-06-09 NOTE — ED Triage Notes (Signed)
Cough since the being of the beginning of the week  Seen at urgent care had positive COVID test  Sent to ED for low O2 levels, 87% RA  Pt denies CP and SOB

## 2023-06-09 NOTE — Patient Instructions (Signed)
Medication Instructions:  Your physician recommends that you continue on your current medications as directed. Please refer to the Current Medication list given to you today.  *If you need a refill on your cardiac medications before your next appointment, please call your pharmacy*   Lab Work: D-dimer If you have labs (blood work) drawn today and your tests are completely normal, you will receive your results only by: MyChart Message (if you have MyChart) OR A paper copy in the mail If you have any lab test that is abnormal or we need to change your treatment, we will call you to review the results.   Testing/Procedures: Your physician has recommended that you have a pulmonary function test. Pulmonary Function Tests are a group of tests that measure how well air moves in and out of your lungs.  Your physician has requested that you have a lexiscan myoview. For further information please visit [email or web address] Please follow instruction sheet, as given.  Your physician has recommended that you have a sleep study. This test records several body functions during sleep, including: brain activity, eye movement, oxygen and carbon dioxide blood levels, heart rate and rhythm, breathing rate and rhythm, the flow of air through your mouth and nose, snoring, body muscle movements, and chest and belly movement.    Follow-Up: At [facility], you and your health needs are our priority.  As part of our continuing mission to provide you with exceptional heart care, we have created designated Provider Care Teams.  These Care Teams include your primary Cardiologist (physician) and Advanced Practice Providers (APPs -  Physician Assistants and Nurse Practitioners) who all work together to provide you with the care you need, when you need it.  We recommend signing up for the patient portal called "MyChart".  Sign up information is provided on this After Visit Summary.  MyChart is used to connect  with patients for Virtual Visits (Telemedicine).  Patients are able to view lab/test results, encounter notes, upcoming appointments, etc.  Non-urgent messages can be sent to your provider as well.   To learn more about what you can do with MyChart, go to [email or web address].    Your next appointment:   6 month(s)  Provider:   You may see Vishnu P Mallipeddi, MD or one of the following Advanced Practice Providers on your designated Care Team:   [place], PA-C  Jacolyn Reedy, [place]     Other Instructions

## 2023-06-09 NOTE — Discharge Instructions (Addendum)
Go to the emergency department for further evaluation

## 2023-06-09 NOTE — Progress Notes (Signed)
Cardiology Office Note  Date: 06/09/2023   ID: Patricia [name], DOB [birth year], MRN [id number]  PCP:  Assunta Found, MD  Cardiologist:  Marjo Bicker, MD Electrophysiologist:  None   History of Present Illness: Patricia [name] is a 72 y.o. female known to have HTN, DM 2, hypothyroidism is here for the management of SOB.  TSH was 8 in 7/24 after which her PCP increased levothyroxine dose to 200 mcg.  Repeat TSH was within normal limits in 8/24.  She also gained almost 20 pounds around this time and noticed DOE since August 2024. Sometimes even at rest, she has to gasp for air.  She was started on p.o. Lasix 20 mg but this did not improve her symptoms.  She continues to have DOE and recently started productive wet cough.  Echocardiogram performed in 03/2023 showed normal LVEF, normal diastology and no evidence of valvular heart disease.  She is currently followed by pulmonology and does not think this is pulmonary but probably from GERD.  Past Medical History:  Diagnosis Date   Arthritis    Diabetes mellitus without complication (HCC)    Hypertension    Hypothyroidism    PONV (postoperative nausea and vomiting)     Past Surgical History:  Procedure Laterality Date   ABDOMINAL HYSTERECTOMY     CHOLECYSTECTOMY N/A 03/02/2015   Procedure: LAPAROSCOPIC CHOLECYSTECTOMY;  Surgeon: Franky Macho, MD;  Location: [facility];  Service: General;  Laterality: N/A;   CYSTOSCOPY W/ URETERAL STENT PLACEMENT Bilateral 02/02/2022   Procedure: CYSTOSCOPY WITH RETROGRADE PYELOGRAM;  Surgeon: Malen Gauze, MD;  Location: [facility];  Service: Urology;  Laterality: Bilateral;   CYSTOSCOPY WITH RETROGRADE PYELOGRAM, URETEROSCOPY AND STENT PLACEMENT Bilateral 03/03/2022   Procedure: CYSTOSCOPY WITH RETROGRADE PYELOGRAM, URETEROSCOPY AND STENT PLACEMENT;  Surgeon: Malen Gauze, MD;  Location: [facility];  Service: Urology;  Laterality: Bilateral;   HOLMIUM LASER APPLICATION Right 05/02/2019    Procedure: HOLMIUM LASER APPLICATION;  Surgeon: Marcine Matar, MD;  Location: [facility];  Service: Urology;  Laterality: Right;   IR URETERAL STENT RIGHT NEW ACCESS W/O SEP NEPHROSTOMY CATH  05/02/2019   KNEE ARTHROSCOPY Right    LIVER BIOPSY  03/02/2015   Procedure: LAPAROSCOPIC LIVER BIOPSY;  Surgeon: Franky Macho, MD;  Location: [facility];  Service: General;;   NEPHROLITHOTOMY Right 05/02/2019   Procedure: NEPHROLITHOTOMY PERCUTANEOUS;  Surgeon: Marcine Matar, MD;  Location: [facility];  Service: Urology;  Laterality: Right;  3 HRS   THYROIDECTOMY     TONSILLECTOMY  1970   TUBAL LIGATION      Current Outpatient Medications  Medication Sig Dispense Refill   albuterol (VENTOLIN HFA) 108 (90 Base) MCG/ACT inhaler Inhale 2 puffs into the lungs every 4 (four) hours as needed for shortness of breath.     APPLE CIDER VINEGAR PO Take 1 tablet by mouth daily.     cetirizine (ZYRTEC) 10 MG tablet Take 10 mg by mouth at bedtime.     CINNAMON PO Take 1,000 mg by mouth daily.     cyclobenzaprine (FLEXERIL) 5 MG tablet Take 1 tablet (5 mg total) by mouth 3 (three) times daily as needed for muscle spasms. 30 tablet 0   escitalopram (LEXAPRO) 5 MG tablet Take 5 mg by mouth daily.     furosemide (LASIX) 20 MG tablet Take 1 tablet (20 mg total) by mouth daily as needed (Shortness of breath). 30 tablet 3   levothyroxine (SYNTHROID) 200 MCG tablet Take 200 mcg by mouth daily.  Melatonin 10 MG CAPS Take 10 mg by mouth at bedtime as needed (sleep).     meloxicam (MOBIC) 15 MG tablet Take 15 mg by mouth as needed for pain.     metFORMIN (GLUCOPHAGE) 500 MG tablet Take 500 mg by mouth 2 (two) times daily.     mirabegron ER (MYRBETRIQ) 50 MG TB24 tablet Take 1 tablet (50 mg total) by mouth daily. 90 tablet 3   Omega-3 Fatty Acids (FISH OIL) 1000 MG CAPS Take 1 capsule by mouth daily.     pregabalin (LYRICA) 300 MG capsule Take 300 mg by mouth 2 (two) times daily.     simvastatin (ZOCOR) 20 MG tablet Take 20  mg by mouth daily.     TIADYLT ER 360 MG 24 hr capsule Take 360 mg by mouth daily.     No current facility-administered medications for this visit.   Allergies:  Amlodipine and Keflex [cephalexin]   Social History: The patient  reports that she has never smoked. She has never used smokeless tobacco. She reports that she does not drink alcohol and does not use drugs.   Family History: The patient's family history includes Aneurysm in her father; Heart attack in her maternal grandfather and maternal grandmother; Leukemia in her mother.   ROS:  Please see the history of present illness. Otherwise, complete review of systems is positive for none.  All other systems are reviewed and negative.   Physical Exam: VS:  Ht 5\' 4"  (1.626 m)   Wt 201 lb (91.2 kg)   BMI 34.50 kg/m , BMI Body mass index is 34.5 kg/m.  Wt Readings from Last 3 Encounters:  06/09/23 201 lb (91.2 kg)  03/17/23 206 lb (93.4 kg)  12/05/22 206 lb (93.4 kg)    General: Patient appears comfortable at rest. HEENT: Conjunctiva and lids normal, oropharynx clear with moist mucosa. Neck: Supple, no elevated JVP or carotid bruits, no thyromegaly. Lungs: Clear to auscultation, nonlabored breathing at rest. Cardiac: Regular rate and rhythm, no S3 or significant systolic murmur, no pericardial rub. Abdomen: Soft, nontender, no hepatomegaly, bowel sounds present, no guarding or rebound. Extremities: No pitting edema, distal pulses 2+. Skin: Warm and dry. Musculoskeletal: No kyphosis. Neuropsychiatric: Alert and oriented x3, affect grossly appropriate.  Recent Labwork: 10/14/2022: BNP 5.0; TSH 4.240 11/26/2022: ALT 37; AST 28 11/28/2022: BUN 19; Creatinine, Ser 0.90; Hemoglobin 12.1; Magnesium 1.8; Platelets 230; Potassium 3.4; Sodium 140  No results found for: "CHOL", "TRIG", "HDL", "CHOLHDL", "VLDL", "LDLCALC", "LDLDIRECT"   Assessment and Plan:  DOE HTN  (Self-reported diagnosis, not on antihypertensives) DM  2 HLD Hypothyroidism OSA   -Ongoing DOE x 4 months with recent onset of productive cough. No orthopnea, PND, leg swelling.  P.o. Lasix did not improve her symptoms, will discontinue. Echocardiogram showed normal LVEF, normal diastology and no valvular heart disease.  Obtain PFTs, D-dimer, split-night study and Lexiscan. -Self-reported she had high HTN but currently not on any antihypertensives.  On metformin 500 mg twice daily for the management of diabetes mellitus type 2, follows with PCP. -Continue simvastatin 20 mg nightly, goal LDL is less than 100.  Follows with PCP.    Medication Adjustments/Labs and Tests Ordered: Current medicines are reviewed at length with the patient today.  Concerns regarding medicines are outlined above.    Disposition:  Follow up 6 months  Signed, Ammaar Encina Verne Spurr, MD, 06/09/2023 1:26 PM    [facility] [address] [place], [place] [address]

## 2023-06-09 NOTE — ED Triage Notes (Signed)
Pt reports she has had a cough x 5 days.

## 2023-06-13 ENCOUNTER — Encounter: Payer: Self-pay | Admitting: Internal Medicine

## 2023-06-19 DIAGNOSIS — E6609 Other obesity due to excess calories: Secondary | ICD-10-CM | POA: Diagnosis not present

## 2023-06-19 DIAGNOSIS — U099 Post covid-19 condition, unspecified: Secondary | ICD-10-CM | POA: Diagnosis not present

## 2023-06-19 DIAGNOSIS — Z6832 Body mass index (BMI) 32.0-32.9, adult: Secondary | ICD-10-CM | POA: Diagnosis not present

## 2023-06-19 DIAGNOSIS — A493 Mycoplasma infection, unspecified site: Secondary | ICD-10-CM | POA: Diagnosis not present

## 2023-06-26 DIAGNOSIS — E6609 Other obesity due to excess calories: Secondary | ICD-10-CM | POA: Diagnosis not present

## 2023-06-26 DIAGNOSIS — Z6832 Body mass index (BMI) 32.0-32.9, adult: Secondary | ICD-10-CM | POA: Diagnosis not present

## 2023-06-26 DIAGNOSIS — M546 Pain in thoracic spine: Secondary | ICD-10-CM | POA: Diagnosis not present

## 2023-06-29 ENCOUNTER — Ambulatory Visit (HOSPITAL_COMMUNITY)
Admission: RE | Admit: 2023-06-29 | Discharge: 2023-06-29 | Disposition: A | Discharge status: Home / Self Care | Payer: PPO | Source: Ambulatory Visit | Attending: Internal Medicine | Admitting: Internal Medicine

## 2023-06-29 ENCOUNTER — Ambulatory Visit (HOSPITAL_BASED_OUTPATIENT_CLINIC_OR_DEPARTMENT_OTHER)
Admission: RE | Admit: 2023-06-29 | Discharge: 2023-06-29 | Disposition: A | Discharge status: Home / Self Care | Payer: PPO | Source: Ambulatory Visit | Attending: Internal Medicine

## 2023-06-29 DIAGNOSIS — R0609 Other forms of dyspnea: Secondary | ICD-10-CM | POA: Insufficient documentation

## 2023-06-29 LAB — NM MYOCAR MULTI W/SPECT W/WALL MOTION / EF
LV dias vol: 70 mL (ref 46–106)
LV sys vol: 19 mL
Nuc Stress EF: 73 %
Peak HR: 133 {beats}/min
RATE: 0.6
Rest HR: 72 {beats}/min
Rest Nuclear Isotope Dose: 11 mCi
SDS: 4
SRS: 0
SSS: 4
ST Depression (mm): 0 mm
Stress Nuclear Isotope Dose: 33 mCi
TID: 0.91

## 2023-06-29 MED ORDER — SODIUM CHLORIDE FLUSH 0.9 % IV SOLN
INTRAVENOUS | Status: AC
  Administered 2023-06-29: 10 mL via INTRAVENOUS
  Filled 2023-06-29: qty 10

## 2023-06-29 MED ORDER — TECHNETIUM TC 99M TETROFOSMIN IV KIT
10.0000 | PACK | Freq: Once | INTRAVENOUS | Status: AC | PRN
  Administered 2023-06-29: 11 via INTRAVENOUS

## 2023-06-29 MED ORDER — REGADENOSON 0.4 MG/5ML IV SOLN
INTRAVENOUS | Status: AC
  Administered 2023-06-29: 0.4 mg via INTRAVENOUS
  Filled 2023-06-29: qty 5

## 2023-06-29 MED ORDER — TECHNETIUM TC 99M TETROFOSMIN IV KIT
30.0000 | PACK | Freq: Once | INTRAVENOUS | Status: AC | PRN
  Administered 2023-06-29: 33 via INTRAVENOUS

## 2023-07-21 DIAGNOSIS — Z6833 Body mass index (BMI) 33.0-33.9, adult: Secondary | ICD-10-CM | POA: Diagnosis not present

## 2023-07-21 DIAGNOSIS — J45909 Unspecified asthma, uncomplicated: Secondary | ICD-10-CM | POA: Diagnosis not present

## 2023-07-21 DIAGNOSIS — Z20828 Contact with and (suspected) exposure to other viral communicable diseases: Secondary | ICD-10-CM | POA: Diagnosis not present

## 2023-07-21 DIAGNOSIS — J069 Acute upper respiratory infection, unspecified: Secondary | ICD-10-CM | POA: Diagnosis not present

## 2023-07-21 DIAGNOSIS — E6609 Other obesity due to excess calories: Secondary | ICD-10-CM | POA: Diagnosis not present

## 2023-07-31 DIAGNOSIS — E890 Postprocedural hypothyroidism: Secondary | ICD-10-CM | POA: Diagnosis not present

## 2023-07-31 DIAGNOSIS — E782 Mixed hyperlipidemia: Secondary | ICD-10-CM | POA: Diagnosis not present

## 2023-07-31 DIAGNOSIS — I10 Essential (primary) hypertension: Secondary | ICD-10-CM | POA: Diagnosis not present

## 2023-07-31 DIAGNOSIS — E7849 Other hyperlipidemia: Secondary | ICD-10-CM | POA: Diagnosis not present

## 2023-07-31 DIAGNOSIS — R251 Tremor, unspecified: Secondary | ICD-10-CM | POA: Diagnosis not present

## 2023-07-31 DIAGNOSIS — E1140 Type 2 diabetes mellitus with diabetic neuropathy, unspecified: Secondary | ICD-10-CM | POA: Diagnosis not present

## 2023-07-31 DIAGNOSIS — E6609 Other obesity due to excess calories: Secondary | ICD-10-CM | POA: Diagnosis not present

## 2023-07-31 DIAGNOSIS — T461X5A Adverse effect of calcium-channel blockers, initial encounter: Secondary | ICD-10-CM | POA: Diagnosis not present

## 2023-07-31 DIAGNOSIS — Z6833 Body mass index (BMI) 33.0-33.9, adult: Secondary | ICD-10-CM | POA: Diagnosis not present

## 2023-08-01 ENCOUNTER — Ambulatory Visit (HOSPITAL_COMMUNITY): Payer: PPO

## 2023-08-09 ENCOUNTER — Telehealth: Payer: Self-pay

## 2023-08-09 NOTE — Telephone Encounter (Signed)
Medication prior authorization request received.  Completed PA request through cover my meds for drug Mirabegron ER 50 MG. KEY: [id number]  Approved: Pending

## 2023-09-07 ENCOUNTER — Ambulatory Visit (HOSPITAL_COMMUNITY)
Admission: RE | Admit: 2023-09-07 | Discharge: 2023-09-07 | Disposition: A | Discharge status: Home / Self Care | Payer: PPO | Source: Ambulatory Visit | Attending: Internal Medicine | Admitting: Internal Medicine

## 2023-09-07 DIAGNOSIS — R0609 Other forms of dyspnea: Secondary | ICD-10-CM | POA: Diagnosis not present

## 2023-09-07 LAB — PULMONARY FUNCTION TEST
DL/VA % pred: 121 %
DL/VA: 5.06 ml/min/mmHg/L
DLCO unc % pred: 101 %
DLCO unc: 19.3 ml/min/mmHg
FEF 25-75 Post: 3.04 L/s
FEF 25-75 Pre: 3.11 L/s
FEF2575-%Change-Post: -2 %
FEF2575-%Pred-Post: 168 %
FEF2575-%Pred-Pre: 172 %
FEV1-%Change-Post: -1 %
FEV1-%Pred-Post: 115 %
FEV1-%Pred-Pre: 116 %
FEV1-Post: 2.54 L
FEV1-Pre: 2.58 L
FEV1FVC-%Change-Post: 2 %
FEV1FVC-%Pred-Pre: 110 %
FEV6-%Change-Post: -3 %
FEV6-%Pred-Post: 105 %
FEV6-%Pred-Pre: 109 %
FEV6-Post: 2.95 L
FEV6-Pre: 3.07 L
FEV6FVC-%Change-Post: 0 %
FEV6FVC-%Pred-Post: 104 %
FEV6FVC-%Pred-Pre: 104 %
FVC-%Change-Post: -3 %
FVC-%Pred-Post: 101 %
FVC-%Pred-Pre: 105 %
FVC-Post: 2.96 L
FVC-Pre: 3.08 L
Post FEV1/FVC ratio: 86 %
Post FEV6/FVC ratio: 100 %
Pre FEV1/FVC ratio: 84 %
Pre FEV6/FVC Ratio: 100 %
RV % pred: 86 %
RV: 1.92 L
TLC % pred: 98 %
TLC: 4.97 L

## 2023-09-07 MED ORDER — ALBUTEROL SULFATE (2.5 MG/3ML) 0.083% IN NEBU
2.5000 mg | INHALATION_SOLUTION | Freq: Once | RESPIRATORY_TRACT | Status: AC
  Administered 2023-09-07: 2.5 mg via RESPIRATORY_TRACT

## 2023-09-14 DIAGNOSIS — Z6830 Body mass index (BMI) 30.0-30.9, adult: Secondary | ICD-10-CM | POA: Diagnosis not present

## 2023-09-14 DIAGNOSIS — E890 Postprocedural hypothyroidism: Secondary | ICD-10-CM | POA: Diagnosis not present

## 2023-09-14 DIAGNOSIS — E6609 Other obesity due to excess calories: Secondary | ICD-10-CM | POA: Diagnosis not present

## 2023-10-09 DIAGNOSIS — Z6829 Body mass index (BMI) 29.0-29.9, adult: Secondary | ICD-10-CM | POA: Diagnosis not present

## 2023-10-09 DIAGNOSIS — M1991 Primary osteoarthritis, unspecified site: Secondary | ICD-10-CM | POA: Diagnosis not present

## 2023-10-09 DIAGNOSIS — E663 Overweight: Secondary | ICD-10-CM | POA: Diagnosis not present

## 2023-10-13 DIAGNOSIS — E1140 Type 2 diabetes mellitus with diabetic neuropathy, unspecified: Secondary | ICD-10-CM | POA: Diagnosis not present

## 2023-10-13 DIAGNOSIS — T461X5A Adverse effect of calcium-channel blockers, initial encounter: Secondary | ICD-10-CM | POA: Diagnosis not present

## 2023-10-13 DIAGNOSIS — E663 Overweight: Secondary | ICD-10-CM | POA: Diagnosis not present

## 2023-10-13 DIAGNOSIS — R251 Tremor, unspecified: Secondary | ICD-10-CM | POA: Diagnosis not present

## 2023-10-13 DIAGNOSIS — Z6829 Body mass index (BMI) 29.0-29.9, adult: Secondary | ICD-10-CM | POA: Diagnosis not present

## 2023-10-13 DIAGNOSIS — E890 Postprocedural hypothyroidism: Secondary | ICD-10-CM | POA: Diagnosis not present

## 2023-10-17 ENCOUNTER — Other Ambulatory Visit (HOSPITAL_COMMUNITY): Payer: Self-pay | Admitting: Family Medicine

## 2023-10-17 DIAGNOSIS — Z1231 Encounter for screening mammogram for malignant neoplasm of breast: Secondary | ICD-10-CM

## 2023-10-20 DIAGNOSIS — E663 Overweight: Secondary | ICD-10-CM | POA: Diagnosis not present

## 2023-10-20 DIAGNOSIS — Z6829 Body mass index (BMI) 29.0-29.9, adult: Secondary | ICD-10-CM | POA: Diagnosis not present

## 2023-10-20 DIAGNOSIS — M1712 Unilateral primary osteoarthritis, left knee: Secondary | ICD-10-CM | POA: Diagnosis not present

## 2023-10-25 DIAGNOSIS — R112 Nausea with vomiting, unspecified: Secondary | ICD-10-CM | POA: Diagnosis not present

## 2023-10-25 DIAGNOSIS — Z20828 Contact with and (suspected) exposure to other viral communicable diseases: Secondary | ICD-10-CM | POA: Diagnosis not present

## 2023-10-25 DIAGNOSIS — E663 Overweight: Secondary | ICD-10-CM | POA: Diagnosis not present

## 2023-10-25 DIAGNOSIS — R6889 Other general symptoms and signs: Secondary | ICD-10-CM | POA: Diagnosis not present

## 2023-10-25 DIAGNOSIS — N390 Urinary tract infection, site not specified: Secondary | ICD-10-CM | POA: Diagnosis not present

## 2023-10-25 DIAGNOSIS — Z6829 Body mass index (BMI) 29.0-29.9, adult: Secondary | ICD-10-CM | POA: Diagnosis not present

## 2023-10-27 ENCOUNTER — Ambulatory Visit (HOSPITAL_COMMUNITY)
Admission: RE | Admit: 2023-10-27 | Discharge: 2023-10-27 | Disposition: A | Discharge status: Home / Self Care | Source: Ambulatory Visit | Attending: Family Medicine | Admitting: Family Medicine

## 2023-10-27 ENCOUNTER — Encounter (HOSPITAL_COMMUNITY): Payer: Self-pay

## 2023-10-27 ENCOUNTER — Other Ambulatory Visit (HOSPITAL_COMMUNITY): Payer: Self-pay | Admitting: Family Medicine

## 2023-10-27 DIAGNOSIS — Z1231 Encounter for screening mammogram for malignant neoplasm of breast: Secondary | ICD-10-CM | POA: Insufficient documentation

## 2023-10-27 DIAGNOSIS — Z6829 Body mass index (BMI) 29.0-29.9, adult: Secondary | ICD-10-CM | POA: Diagnosis not present

## 2023-10-27 DIAGNOSIS — M25562 Pain in left knee: Secondary | ICD-10-CM | POA: Diagnosis not present

## 2023-10-27 DIAGNOSIS — M1712 Unilateral primary osteoarthritis, left knee: Secondary | ICD-10-CM

## 2023-10-27 DIAGNOSIS — E663 Overweight: Secondary | ICD-10-CM | POA: Diagnosis not present

## 2023-11-20 ENCOUNTER — Ambulatory Visit: Admitting: Orthopedic Surgery

## 2023-11-20 ENCOUNTER — Encounter: Payer: Self-pay | Admitting: Orthopedic Surgery

## 2023-11-20 VITALS — BP 140/83 | HR 111 | Ht 64.0 in | Wt 185.0 lb

## 2023-11-20 DIAGNOSIS — G8929 Other chronic pain: Secondary | ICD-10-CM

## 2023-11-20 DIAGNOSIS — M1712 Unilateral primary osteoarthritis, left knee: Secondary | ICD-10-CM

## 2023-11-20 DIAGNOSIS — M545 Low back pain, unspecified: Secondary | ICD-10-CM | POA: Diagnosis not present

## 2023-11-20 MED ORDER — METHYLPREDNISOLONE ACETATE 40 MG/ML IJ SUSP
40.0000 mg | Freq: Once | INTRAMUSCULAR | Status: AC
  Administered 2023-11-20: 40 mg via INTRA_ARTICULAR

## 2023-11-20 NOTE — Progress Notes (Signed)
 Chief Complaint  Patient presents with   Knee Pain    LEFT    History of present illness  Patricia [name] is 73 years old she says she had a left knee arthroscopy in 2015 or 2016 for torn meniscus presents with primarily anterior knee pain which is worse with weightbearing and stair climbing  She has to go up the stairs sideways  She had an injection many years ago and she took meloxicam and Celebrex currently she is managing her pain with Advil 2 in the morning 2 in the evening and Voltaren gel twice a day  She did have an x-ray which showed severe arthritis of the patellofemoral joint and medial compartment  She lost 25 pounds from not eating.  She did have her mammogram but did not have her colonoscopy because her blood pressure was too high  Lab work reportedly has been normal  Hemoglobin A1c reportedly 7  Other review of systems History of chronic back pain for which she received injections years ago pain is gotten worse recently.  She relates this to taking a new medication but when she stopped taking the medication the pain continued.  She was having pain in multiple joints while on the new diabetic med  Leg often gives way requiring the patient to use a cane.  Her back pain started secondary to a fall some 10 years or so ago   Physical Exam Vitals and nursing note reviewed.  Constitutional:      Appearance: Normal appearance.  HENT:     Head: Normocephalic and atraumatic.  Eyes:     General: No scleral icterus.       Right eye: No discharge.        Left eye: No discharge.     Extraocular Movements: Extraocular movements intact.     Conjunctiva/sclera: Conjunctivae normal.     Pupils: Pupils are equal, round, and reactive to light.  Cardiovascular:     Rate and Rhythm: Normal rate.     Pulses: Normal pulses.  Musculoskeletal:     Comments: Left knee normal appearance small effusion  Tenderness medial joint line and peripatellar  Knee flexion 120 extension near 0  Quad  strength normal  Stability test all 4 ligaments normal    Skin:    General: Skin is warm and dry.     Capillary Refill: Capillary refill takes less than 2 seconds.  Neurological:     General: No focal deficit present.     Mental Status: She is alert and oriented to person, place, and time.     Gait: Gait abnormal.  Psychiatric:        Mood and Affect: Mood normal.        Behavior: Behavior normal.        Thought Content: Thought content normal.        Judgment: Judgment normal.      Encounter Diagnoses  Name Primary?   Chronic pain of left knee    Chronic midline low back pain without sciatica    Primary osteoarthritis of left knee Yes   Assessment and plan  She definitely has osteoarthritis of the left knee and it is severe.  I disagree with the x-ray report as my findings indicate severe patellofemoral disease and moderate to severe medial compartment disease  Her knee is stable her ligaments are stable her strength is good in her leg I doubt her knee is giving way because of the knee more likely because of spinal stenosis related  to her back She should have evaluation by spine Ortho for definitive management regarding that  I encouraged her to keep her A1c under 8  I encouraged her to have her colonoscopy  She should start Tylenol  500 mg every 6 hours in addition to the Advil and Voltaren gel  I injected her left knee  Follow-up in 3 months   Procedure Procedure note left knee injection   verbal consent was obtained to inject left knee joint  Timeout was completed to confirm the site of injection  The medications used were depomedrol 40 mg and 1% lidocaine  3 cc Anesthesia was provided by ethyl chloride and the skin was prepped with alcohol.  After cleaning the skin with alcohol a 20-gauge needle was used to inject the left knee joint. There were no complications. A sterile bandage was applied.

## 2023-11-20 NOTE — Patient Instructions (Addendum)
 We are referring you to [facility] from [facility] address is [address] [place] The phone number is [phone]  The office will call you with an appointment Dr. Jo Mouse have received an injection of steroids into the joint. 15% of patients will have increased pain within the 24 hours postinjection.   This is transient and will go away.   We recommend that you use ice packs on the injection site for 20 minutes every 2 hours and extra strength Tylenol  2 tablets every 8 as needed until the pain resolves.  If you continue to have pain after taking the Tylenol  and using the ice please call the office for further instructions.   FOR ARTHRITIS PAIN :  TYLENOL  XS (500MG ) EVERY 6 HRS

## 2023-11-20 NOTE — Progress Notes (Signed)
  Intake history:  BP (!) 140/83   Pulse (!) 111   Ht 5\' 4"  (1.626 m)   Wt 185 lb (83.9 kg)   BMI 31.76 kg/m  Body mass index is 31.76 kg/m.    WHAT ARE WE SEEING YOU FOR TODAY?   left knee(s)  How long has this bothered you? (DOI?DOS?WS?)  END OF MARCH pain started no injury   Anticoag.  No  Diabetes Yes  Heart disease No  Hypertension Yes  SMOKING HX No  Kidney disease     Latest Ref Rng & Units 11/28/2022    6:45 AM 11/27/2022    3:57 AM 11/26/2022   11:25 AM  CMP  Glucose 70 - 99 mg/dL 981  191  478   BUN 8 - 23 mg/dL 19  32  41   Creatinine 0.44 - 1.00 mg/dL 2.95  6.21  3.08   Sodium 135 - 145 mmol/L 140  136  132   Potassium 3.5 - 5.1 mmol/L 3.4  3.3  5.1   Chloride 98 - 111 mmol/L 103  106  106   CO2 22 - 32 mmol/L 29  19  13    Calcium  8.9 - 10.3 mg/dL 8.5  8.3  65.7   Total Protein 6.5 - 8.1 g/dL   8.7   Total Bilirubin 0.3 - 1.2 mg/dL   0.9   Alkaline Phos 38 - 126 U/L   103   AST 15 - 41 U/L   28   ALT 0 - 44 U/L   37      Any ALLERGIES __________ Allergies  Allergen Reactions   Amlodipine Swelling   Keflex [Cephalexin] Rash   ____________________________________   Treatment:  Have you taken:  Tylenol  Yes  Advil No  Had PT No  Had injection Yes/ states has had knee injections and back injections   Other  ___________Meloxicam ____Celebrex __________

## 2023-11-22 ENCOUNTER — Telehealth: Payer: Self-pay | Admitting: Orthopedic Surgery

## 2023-11-22 NOTE — Telephone Encounter (Signed)
 Dr. Delfino Fellers pt - pt lvm stating Dr. Phyllis Breeze wants her to have a colonoscopy and the office here is [place] is requiring a referral.  She is wanting Dr. Marvina Slough to put in a referral for that.  [phone]

## 2023-11-22 NOTE — Progress Notes (Unsigned)
 Name: Patricia [name] DOB: 11-05-1950 MRN: [id number]  History of Present Illness: Patricia [name] is a 73 y.o. female who presents today for follow up visit at [facility]. Relevant History includes: 1. OAB with urinary frequency, urgency, and urge incontinence.  - Likely secondary to diabetic neuropathy. 2. Stress urinary incontinence. 3. Kidney stones. - Has had prior stone procedures including ureteroscopic stone manipulation and PCNL.  - 11/26/2022: CT abdomen/pelvis w/o contrast showed no GU stones or hydronephrosis. 4. Left renal cyst.  At last visit on 06/01/2023: - Doing well.  - Negative KUB. - The plan was: Continue Myrbetriq  50 mg daily. Minimize caffeine intake. Return in about 6 months (around 11/30/2023) for KUB, UA, & f/u with Griselda Lederer NP.  Today: She reports that she was seen by her PCP for a UTI a few weeks ago, which has since resolved. States that is the only UTI she has had in the past 6 months.   She denies recent stone passage. She denies flank pain or abdominal pain. She denies fevers, nausea, or vomiting.  She continues to have urinary urgency, frequency, nocturia x3, and mixed urinary incontinence; wears pads. She continues to take Myrbetriq  (Mirabegron ) 50 mg daily. Denies dysuria, gross hematuria, hesitancy, straining to void, or sensations of incomplete emptying.   Medications: Current Outpatient Medications  Medication Sig Dispense Refill   albuterol  (VENTOLIN  HFA) 108 (90 Base) MCG/ACT inhaler Inhale 2 puffs into the lungs every 4 (four) hours as needed for shortness of breath.     APPLE CIDER VINEGAR PO Take 1 tablet by mouth daily.     cetirizine (ZYRTEC) 10 MG tablet Take 10 mg by mouth at bedtime.     CINNAMON PO Take 1,000 mg by mouth daily.     cyclobenzaprine  (FLEXERIL ) 5 MG tablet Take 1 tablet (5 mg total) by mouth 3 (three) times daily as needed for muscle spasms. 30 tablet 0   escitalopram  (LEXAPRO ) 5 MG tablet  Take 5 mg by mouth daily.     furosemide  (LASIX ) 20 MG tablet Take 1 tablet (20 mg total) by mouth daily as needed (Shortness of breath). 30 tablet 3   levothyroxine  (SYNTHROID ) 200 MCG tablet Take 200 mcg by mouth daily.     Melatonin 10 MG CAPS Take 10 mg by mouth at bedtime as needed (sleep).     meloxicam (MOBIC) 15 MG tablet Take 15 mg by mouth as needed for pain.     metFORMIN  (GLUCOPHAGE ) 500 MG tablet Take 500 mg by mouth 2 (two) times daily.     Omega-3 Fatty Acids (FISH OIL) 1000 MG CAPS Take 1 capsule by mouth daily.     pregabalin  (LYRICA ) 300 MG capsule Take 300 mg by mouth 2 (two) times daily.     simvastatin  (ZOCOR ) 20 MG tablet Take 20 mg by mouth daily.     TIADYLT  ER 360 MG 24 hr capsule Take 360 mg by mouth daily.     mirabegron  ER (MYRBETRIQ ) 50 MG TB24 tablet Take 1 tablet (50 mg total) by mouth daily. 90 tablet 3   No current facility-administered medications for this visit.    Allergies: Allergies  Allergen Reactions   Amlodipine Swelling   Keflex [Cephalexin] Rash    Past Medical History:  Diagnosis Date   Arthritis    Diabetes mellitus without complication (HCC)    Hypertension    Hypothyroidism    PONV (postoperative nausea and vomiting)    Past Surgical History:  Procedure Laterality  Date   ABDOMINAL HYSTERECTOMY     CHOLECYSTECTOMY N/A 03/02/2015   Procedure: LAPAROSCOPIC CHOLECYSTECTOMY;  Surgeon: Alanda Allegra, MD;  Location: [facility];  Service: General;  Laterality: N/A;   CYSTOSCOPY W/ URETERAL STENT PLACEMENT Bilateral 02/02/2022   Procedure: CYSTOSCOPY WITH RETROGRADE PYELOGRAM;  Surgeon: Marco Severs, MD;  Location: [facility];  Service: Urology;  Laterality: Bilateral;   CYSTOSCOPY WITH RETROGRADE PYELOGRAM, URETEROSCOPY AND STENT PLACEMENT Bilateral 03/03/2022   Procedure: CYSTOSCOPY WITH RETROGRADE PYELOGRAM, URETEROSCOPY AND STENT PLACEMENT;  Surgeon: Marco Severs, MD;  Location: [facility];  Service: Urology;  Laterality: Bilateral;    HOLMIUM LASER APPLICATION Right 05/02/2019   Procedure: HOLMIUM LASER APPLICATION;  Surgeon: Trent Frizzle, MD;  Location: [facility];  Service: Urology;  Laterality: Right;   IR URETERAL STENT RIGHT NEW ACCESS W/O SEP NEPHROSTOMY CATH  05/02/2019   KNEE ARTHROSCOPY Right    LIVER BIOPSY  03/02/2015   Procedure: LAPAROSCOPIC LIVER BIOPSY;  Surgeon: Alanda Allegra, MD;  Location: [facility];  Service: General;;   NEPHROLITHOTOMY Right 05/02/2019   Procedure: NEPHROLITHOTOMY PERCUTANEOUS;  Surgeon: Trent Frizzle, MD;  Location: [facility];  Service: Urology;  Laterality: Right;  3 HRS   THYROIDECTOMY     TONSILLECTOMY  1970   TUBAL LIGATION     Family History  Problem Relation Age of Onset   Leukemia Mother    Aneurysm Father    Heart attack Maternal Grandmother    Heart attack Maternal Grandfather    Social History   Socioeconomic History   Marital status: Widowed    Spouse name: Not on file   Number of children: Not on file   Years of education: Not on file   Highest education level: Not on file  Occupational History   Not on file  Tobacco Use   Smoking status: Never   Smokeless tobacco: Never  Vaping Use   Vaping status: Never Used  Substance and Sexual Activity   Alcohol use: No   Drug use: No   Sexual activity: Not Currently  Other Topics Concern   Not on file  Social History Narrative   Not on file   Social Drivers of Health   Financial Resource Strain: Not on file  Food Insecurity: No Food Insecurity (11/26/2022)   Hunger Vital Sign    Worried About Running Out of Food in the Last Year: Never true    Ran Out of Food in the Last Year: Never true  Transportation Needs: No Transportation Needs (11/26/2022)   PRAPARE - Administrator, Civil Service (Medical): No    Lack of Transportation (Non-Medical): No  Physical Activity: Not on file  Stress: Not on file  Social Connections: Not on file  Intimate Partner Violence: Not At Risk (11/26/2022)   Humiliation,  Afraid, Rape, and Kick questionnaire    Fear of Current or Ex-Partner: No    Emotionally Abused: No    Physically Abused: No    Sexually Abused: No    SUBJECTIVE  Review of Systems Constitutional: Patient denies any unintentional weight loss or change in strength lntegumentary: Patient denies any rashes or pruritus Cardiovascular: Patient denies chest pain or syncope Respiratory: Patient denies shortness of breath Gastrointestinal: Patient denies nausea, vomiting, constipation, or diarrhea  Musculoskeletal: Patient denies muscle cramps or weakness Neurologic: Patient denies convulsions or seizures Allergic/Immunologic: Patient denies recent allergic reaction(s) Hematologic/Lymphatic: Patient denies bleeding tendencies Endocrine: Patient denies heat/cold intolerance  GU: As per HPI.  OBJECTIVE Vitals:   11/23/23 1015  BP: 136/80  Pulse: (!) 116   There is no height or weight on file to calculate BMI.  Physical Examination Constitutional: No obvious distress; patient is non-toxic appearing  Cardiovascular: No visible lower extremity edema.  Respiratory: The patient does not have audible wheezing/stridor; respirations do not appear labored  Gastrointestinal: Abdomen non-distended Musculoskeletal: Normal ROM of UEs  Skin: No obvious rashes/open sores  Neurologic: CN 2-12 grossly intact Psychiatric: Answered questions appropriately with normal affect  Hematologic/Lymphatic/Immunologic: No obvious bruises or sites of spontaneous bleeding  Urine microscopy: 6-10 WBC/hpf, 0-2 RBC/hpf, 0 bacteria  ASSESSMENT Overactive bladder - Plan: Urinalysis, Routine w reflex microscopic, mirabegron  ER (MYRBETRIQ ) 50 MG TB24 tablet  Urge incontinence - Plan: Urinalysis, Routine w reflex microscopic, mirabegron  ER (MYRBETRIQ ) 50 MG TB24 tablet  Kidney stones - Plan: Urinalysis, Routine w reflex microscopic, DG Abd 1 View, US  RENAL  SUI (stress urinary incontinence, female)  We reviewed  OAB treatment options; she elects to continue Myrbetriq  50 mg daily. Will plan to follow up in 6 months with KUB and RUS for stone / renal cyst surveillance or sooner if needed. Patient verbalized understanding of and agreement with current plan. All questions were answered.  PLAN Advised the following: Continue Myrbetriq  (Mirabegron ) 50 mg daily; refills sent.  Return in about 6 months (around 05/24/2024) for RUS, KUB, UA, PVR, & f/u with Griselda Lederer NP.  Orders Placed This Encounter  Procedures   DG Abd 1 View    Standing Status:   Future    Expected Date:   05/24/2024    Expiration Date:   11/22/2024    Reason for Exam (SYMPTOM  OR DIAGNOSIS REQUIRED):   kidney stone    Preferred imaging location?:   [facility]   US  RENAL    Standing Status:   Future    Expected Date:   05/24/2024    Expiration Date:   11/22/2024    Reason for Exam (SYMPTOM  OR DIAGNOSIS REQUIRED):   kidney stone known or suspected    Preferred imaging location?:   [facility]   Urinalysis, Routine w reflex microscopic    It has been explained that the patient is to follow regularly with their PCP in addition to all other providers involved in their care and to follow instructions provided by these respective offices. Patient advised to contact urology clinic if any urologic-pertaining questions, concerns, new symptoms or problems arise in the interim period.  There are no Patient Instructions on file for this visit.  Electronically signed by:  Lauretta Ponto, MSN, FNP-C, CUNP 11/23/2023 10:53 AM

## 2023-11-22 NOTE — Telephone Encounter (Signed)
 Patient advised and she verbalized understanding

## 2023-11-23 ENCOUNTER — Encounter: Payer: Self-pay | Admitting: Urology

## 2023-11-23 ENCOUNTER — Ambulatory Visit: Admitting: Urology

## 2023-11-23 VITALS — BP 136/80 | HR 116

## 2023-11-23 DIAGNOSIS — N3281 Overactive bladder: Secondary | ICD-10-CM | POA: Diagnosis not present

## 2023-11-23 DIAGNOSIS — N393 Stress incontinence (female) (male): Secondary | ICD-10-CM

## 2023-11-23 DIAGNOSIS — N200 Calculus of kidney: Secondary | ICD-10-CM | POA: Diagnosis not present

## 2023-11-23 DIAGNOSIS — N3941 Urge incontinence: Secondary | ICD-10-CM

## 2023-11-23 DIAGNOSIS — N3946 Mixed incontinence: Secondary | ICD-10-CM | POA: Diagnosis not present

## 2023-11-23 LAB — URINALYSIS, ROUTINE W REFLEX MICROSCOPIC
Bilirubin, UA: NEGATIVE
Glucose, UA: NEGATIVE
Ketones, UA: NEGATIVE
Nitrite, UA: NEGATIVE
Protein,UA: NEGATIVE
RBC, UA: NEGATIVE
Specific Gravity, UA: 1.01 (ref 1.005–1.030)
Urobilinogen, Ur: 0.2 mg/dL (ref 0.2–1.0)
pH, UA: 6.5 (ref 5.0–7.5)

## 2023-11-23 LAB — MICROSCOPIC EXAMINATION: Bacteria, UA: NONE SEEN

## 2023-11-23 MED ORDER — MIRABEGRON ER 50 MG PO TB24
50.0000 mg | ORAL_TABLET | Freq: Every day | ORAL | 3 refills | Status: AC
Start: 2023-11-23 — End: ?

## 2023-11-27 ENCOUNTER — Encounter (INDEPENDENT_AMBULATORY_CARE_PROVIDER_SITE_OTHER): Payer: Self-pay | Admitting: *Deleted

## 2023-11-29 DIAGNOSIS — E890 Postprocedural hypothyroidism: Secondary | ICD-10-CM | POA: Diagnosis not present

## 2023-11-30 ENCOUNTER — Ambulatory Visit: Payer: PPO | Admitting: Urology

## 2023-12-05 ENCOUNTER — Telehealth: Payer: Self-pay

## 2023-12-05 NOTE — Telephone Encounter (Signed)
 Room 1 Thanks

## 2023-12-05 NOTE — Telephone Encounter (Signed)
 Who is your primary care physician: Dr.John Glady Laming  Reasons for the colonoscopy: screening  Have you had a colonoscopy before?  no  Do you have family history of colon cancer? no  Previous colonoscopy with polyps removed? no  Do you have a history colorectal cancer?   no  Are you diabetic? If yes, Type 1 or Type 2?    Yes type 2  Do you have a prosthetic or mechanical heart valve? no  Do you have a pacemaker/defibrillator?   no  Have you had endocarditis/atrial fibrillation? no  Have you had joint replacement within the last 12 months?  no  Do you tend to be constipated or have to use laxatives? no  Do you have any history of drugs or alchohol?  no  Do you use supplemental oxygen?  no  Have you had a stroke or heart attack within the last 6 months? no  Do you take weight loss medication?  no  For female patients: have you had a hysterectomy?  yes                                     are you post menopausal?       yes                                            do you still have your menstrual cycle? no      Do you take any blood-thinning medications such as: (aspirin, warfarin, Plavix, Aggrenox)  no  If yes we need the name, milligram, dosage and who is prescribing doctor  Current Outpatient Medications on File Prior to Visit  Medication Sig Dispense Refill   albuterol  (VENTOLIN  HFA) 108 (90 Base) MCG/ACT inhaler Inhale 2 puffs into the lungs every 4 (four) hours as needed for shortness of breath.     APPLE CIDER VINEGAR PO Take 1 tablet by mouth daily.     cetirizine (ZYRTEC) 10 MG tablet Take 10 mg by mouth at bedtime.     CINNAMON PO Take 1,000 mg by mouth daily.     cyclobenzaprine  (FLEXERIL ) 5 MG tablet Take 1 tablet (5 mg total) by mouth 3 (three) times daily as needed for muscle spasms. 30 tablet 0   escitalopram  (LEXAPRO ) 5 MG tablet Take 5 mg by mouth daily.     furosemide  (LASIX ) 20 MG tablet Take 1 tablet (20 mg total) by mouth daily as needed (Shortness of  breath). 30 tablet 3   levothyroxine  (SYNTHROID ) 200 MCG tablet Take 200 mcg by mouth daily.     Melatonin 10 MG CAPS Take 10 mg by mouth at bedtime as needed (sleep).     meloxicam (MOBIC) 15 MG tablet Take 15 mg by mouth as needed for pain.     metFORMIN  (GLUCOPHAGE ) 500 MG tablet Take 500 mg by mouth 2 (two) times daily.     mirabegron  ER (MYRBETRIQ ) 50 MG TB24 tablet Take 1 tablet (50 mg total) by mouth daily. 90 tablet 3   Omega-3 Fatty Acids (FISH OIL) 1000 MG CAPS Take 1 capsule by mouth daily.     pregabalin  (LYRICA ) 300 MG capsule Take 300 mg by mouth 2 (two) times daily.     simvastatin  (ZOCOR ) 20 MG tablet Take 20 mg by mouth daily.  TIADYLT  ER 360 MG 24 hr capsule Take 360 mg by mouth daily.     No current facility-administered medications on file prior to visit.    Allergies  Allergen Reactions   Amlodipine Swelling   Keflex [Cephalexin] Rash     Pharmacy: [facility]  Primary Insurance Name: [employer] [id number]  Best number where you can be reached: [phone]

## 2023-12-06 ENCOUNTER — Ambulatory Visit: Admitting: Urology

## 2023-12-06 ENCOUNTER — Ambulatory Visit (HOSPITAL_COMMUNITY)
Admission: RE | Admit: 2023-12-06 | Discharge: 2023-12-06 | Disposition: A | Discharge status: Home / Self Care | Source: Ambulatory Visit | Attending: Urology | Admitting: Urology

## 2023-12-06 ENCOUNTER — Other Ambulatory Visit: Payer: Self-pay | Admitting: Urology

## 2023-12-06 ENCOUNTER — Encounter: Payer: Self-pay | Admitting: Urology

## 2023-12-06 VITALS — BP 129/84 | HR 103

## 2023-12-06 DIAGNOSIS — M549 Dorsalgia, unspecified: Secondary | ICD-10-CM | POA: Diagnosis not present

## 2023-12-06 DIAGNOSIS — N200 Calculus of kidney: Secondary | ICD-10-CM | POA: Insufficient documentation

## 2023-12-06 DIAGNOSIS — Z87442 Personal history of urinary calculi: Secondary | ICD-10-CM | POA: Diagnosis not present

## 2023-12-06 LAB — MICROSCOPIC EXAMINATION

## 2023-12-06 LAB — URINALYSIS, ROUTINE W REFLEX MICROSCOPIC
Bilirubin, UA: NEGATIVE
Glucose, UA: NEGATIVE
Ketones, UA: NEGATIVE
Nitrite, UA: POSITIVE — AB
Protein,UA: NEGATIVE
RBC, UA: NEGATIVE
Specific Gravity, UA: <1.005 — ABNORMAL LOW (ref 1.005–1.030)
Urobilinogen, Ur: 0.2 mg/dL (ref 0.2–1.0)
pH, UA: 6 (ref 5.0–7.5)

## 2023-12-06 MED ORDER — NA SULFATE-K SULFATE-MG SULF 17.5-3.13-1.6 GM/177ML PO SOLN: 1.0000 | Freq: Once | ORAL | 0 refills | Status: AC

## 2023-12-06 NOTE — Addendum Note (Signed)
 Addended by: Feliz Hosteller on: 12/06/2023 08:33 AM   Modules accepted: Orders

## 2023-12-06 NOTE — Progress Notes (Signed)
 Name: Patricia [name] DOB: [birth year] MRN: [id number]  History of Present Illness: Patricia [name] is a 72 y.o. female who presents today for follow up visit at [facility] [place]. Relevant History includes: 1. OAB with urinary frequency, urgency, and urge incontinence.  - Likely secondary to diabetic neuropathy. - Taking Myrbetriq  (Mirabegron ) 50 mg daily.  2. Stress urinary incontinence. 3. Kidney stones. - Has had prior stone procedures including ureteroscopic stone manipulation and PCNL.  - 11/26/2022: CT abdomen/pelvis w/o contrast showed no GU stones or hydronephrosis. - 06/01/2023: Negative KUB. 4. Left renal cyst.   At last visit on 11/23/2023: Discussed OAB symptoms. Asymptomatic for stones.   Today: She reports possible stone passage. Reports left flank pain for about 1 week. Denies any precipitating events or agitating factors. Unrelieved / unchanged with position changes. Taking OTC analgesics with some relief. States the pain is intermittent and described as a dull ache. Denies abdominal pain, fevers, nausea, or vomiting. She denies acute change in urinary symptoms - denies dysuria or gross hematuria.  KUB today: Awaiting radiology read; no obstructing stones appreciated per provider interpretation.   Medications: Current Outpatient Medications  Medication Sig Dispense Refill   acetaminophen  (TYLENOL ) 500 MG tablet Take 500 mg by mouth every 6 (six) hours as needed for mild pain (pain score 1-3).     b complex vitamins capsule Take 1 capsule by mouth daily.     cetirizine (ZYRTEC) 10 MG tablet Take 10 mg by mouth at bedtime.     Cholecalciferol (VITAMIN D3) 50 MCG (2000 UT) CAPS Take 1 capsule by mouth daily at 12 noon.     CINNAMON PO Take 1,000 mg by mouth daily.     cyclobenzaprine  (FLEXERIL ) 5 MG tablet Take 1 tablet (5 mg total) by mouth 3 (three) times daily as needed for muscle spasms. 30 tablet 0   escitalopram  (LEXAPRO ) 5 MG tablet Take 5 mg by mouth  daily.     levothyroxine  (SYNTHROID ) 200 MCG tablet Take 200 mcg by mouth daily.     Melatonin 10 MG CAPS Take 10 mg by mouth at bedtime as needed (sleep).     meloxicam (MOBIC) 15 MG tablet Take 15 mg by mouth as needed for pain.     metFORMIN  (GLUCOPHAGE ) 500 MG tablet Take 500 mg by mouth 2 (two) times daily. (Patient taking differently: Take 1,000 mg by mouth 2 (two) times daily.)     mirabegron  ER (MYRBETRIQ ) 50 MG TB24 tablet Take 1 tablet (50 mg total) by mouth daily. 90 tablet 3   rOPINIRole  (REQUIP ) 0.5 MG tablet Take 0.5 mg by mouth 3 (three) times daily.     simvastatin  (ZOCOR ) 20 MG tablet Take 20 mg by mouth daily.     TIADYLT  ER 360 MG 24 hr capsule Take 360 mg by mouth daily.     furosemide  (LASIX ) 20 MG tablet Take 1 tablet (20 mg total) by mouth daily as needed (Shortness of breath). (Patient not taking: Reported on 12/06/2023) 30 tablet 3   Na Sulfate-K Sulfate-Mg Sulfate concentrate (SUPREP) 17.5-3.13-1.6 GM/177ML SOLN Take 1 kit (354 mLs total) by mouth once for 1 dose. (Patient not taking: Reported on 12/06/2023) 354 mL 0   No current facility-administered medications for this visit.    Allergies: Allergies  Allergen Reactions   Amlodipine Swelling   Keflex [Cephalexin] Rash    Past Medical History:  Diagnosis Date   Arthritis    Diabetes mellitus without complication (HCC)    Hypertension  Hypothyroidism    PONV (postoperative nausea and vomiting)    Past Surgical History:  Procedure Laterality Date   ABDOMINAL HYSTERECTOMY     CHOLECYSTECTOMY N/A 03/02/2015   Procedure: LAPAROSCOPIC CHOLECYSTECTOMY;  Surgeon: Alanda Allegra, MD;  Location: [facility];  Service: General;  Laterality: N/A;   CYSTOSCOPY W/ URETERAL STENT PLACEMENT Bilateral 02/02/2022   Procedure: CYSTOSCOPY WITH RETROGRADE PYELOGRAM;  Surgeon: Marco Severs, MD;  Location: [facility];  Service: Urology;  Laterality: Bilateral;   CYSTOSCOPY WITH RETROGRADE PYELOGRAM, URETEROSCOPY AND STENT PLACEMENT  Bilateral 03/03/2022   Procedure: CYSTOSCOPY WITH RETROGRADE PYELOGRAM, URETEROSCOPY AND STENT PLACEMENT;  Surgeon: Marco Severs, MD;  Location: [facility];  Service: Urology;  Laterality: Bilateral;   HOLMIUM LASER APPLICATION Right 05/02/2019   Procedure: HOLMIUM LASER APPLICATION;  Surgeon: Trent Frizzle, MD;  Location: [facility];  Service: Urology;  Laterality: Right;   IR URETERAL STENT RIGHT NEW ACCESS W/O SEP NEPHROSTOMY CATH  05/02/2019   KNEE ARTHROSCOPY Right    LIVER BIOPSY  03/02/2015   Procedure: LAPAROSCOPIC LIVER BIOPSY;  Surgeon: Alanda Allegra, MD;  Location: [facility];  Service: General;;   NEPHROLITHOTOMY Right 05/02/2019   Procedure: NEPHROLITHOTOMY PERCUTANEOUS;  Surgeon: Trent Frizzle, MD;  Location: [facility];  Service: Urology;  Laterality: Right;  3 HRS   THYROIDECTOMY     TONSILLECTOMY  1970   TUBAL LIGATION     Family History  Problem Relation Age of Onset   Leukemia Mother    Aneurysm Father    Heart attack Maternal Grandmother    Heart attack Maternal Grandfather    Social History   Socioeconomic History   Marital status: Widowed    Spouse name: Not on file   Number of children: Not on file   Years of education: Not on file   Highest education level: Not on file  Occupational History   Not on file  Tobacco Use   Smoking status: Never   Smokeless tobacco: Never  Vaping Use   Vaping status: Never Used  Substance and Sexual Activity   Alcohol use: No   Drug use: No   Sexual activity: Not Currently  Other Topics Concern   Not on file  Social History Narrative   Not on file   Social Drivers of Health   Financial Resource Strain: Not on file  Food Insecurity: No Food Insecurity (11/26/2022)   Hunger Vital Sign    Worried About Running Out of Food in the Last Year: Never true    Ran Out of Food in the Last Year: Never true  Transportation Needs: No Transportation Needs (11/26/2022)   PRAPARE - Administrator, Civil Service (Medical):  No    Lack of Transportation (Non-Medical): No  Physical Activity: Not on file  Stress: Not on file  Social Connections: Not on file  Intimate Partner Violence: Not At Risk (11/26/2022)   Humiliation, Afraid, Rape, and Kick questionnaire    Fear of Current or Ex-Partner: No    Emotionally Abused: No    Physically Abused: No    Sexually Abused: No    SUBJECTIVE  Review of Systems Constitutional: Patient denies any unintentional weight loss or change in strength lntegumentary: Patient denies any rashes or pruritus Cardiovascular: Patient denies chest pain or syncope Respiratory: Patient denies shortness of breath Gastrointestinal: Patient denies nausea, vomiting, constipation, or diarrhea  Musculoskeletal: Patient denies muscle cramps or weakness Neurologic: Patient denies convulsions or seizures Allergic/Immunologic: Patient denies recent allergic reaction(s) Hematologic/Lymphatic: Patient denies bleeding  tendencies Endocrine: Patient denies heat/cold intolerance  GU: As per HPI.  OBJECTIVE Vitals:   12/06/23 1250  BP: 129/84  Pulse: (!) 103   There is no height or weight on file to calculate BMI.  Physical Examination Constitutional: No obvious distress; patient is non-toxic appearing  Cardiovascular: No visible lower extremity edema.  Respiratory: The patient does not have audible wheezing/stridor; respirations do not appear labored  Gastrointestinal: Abdomen non-distended Musculoskeletal: Normal ROM of UEs  Skin: No obvious rashes/open sores  Neurologic: CN 2-12 grossly intact Psychiatric: Answered questions appropriately with normal affect  Hematologic/Lymphatic/Immunologic: No obvious bruises or sites of spontaneous bleeding  Urine microscopy: 6-10 WBC/hpf, 0-2 RBC/hpf, many bacteria  ASSESSMENT Back pain, unspecified back location, unspecified back pain laterality, unspecified chronicity - Plan: Urinalysis, Routine w reflex microscopic  Kidney stones - Plan:  Urinalysis, Routine w reflex microscopic  We reviewed recent imaging results; awaiting radiology results, appears to have no acute findings per provider interpretation. Likely musculoskeletal; patient has history of chronic back pain. Offered CT stone for further evaluation; patent declined. We agreed to plan for follow up as previously scheduled on 05/24/2024 or sooner if needed. Pt verbalized understanding and agreement. All questions were answered.   PLAN Advised the following: No follow-ups on file.  Orders Placed This Encounter  Procedures   Urinalysis, Routine w reflex microscopic    It has been explained that the patient is to follow regularly with their PCP in addition to all other providers involved in their care and to follow instructions provided by these respective offices. Patient advised to contact urology clinic if any urologic-pertaining questions, concerns, new symptoms or problems arise in the interim period.  There are no Patient Instructions on file for this visit.  Electronically signed by:  Lauretta Ponto, MSN, FNP-C, CUNP 12/06/2023 1:20 PM

## 2023-12-06 NOTE — Telephone Encounter (Signed)
 Spoke with pt. She has been scheduled for 6/25 with Dr. Sammi Crick. Aware will send instructions via mychart since she will not get in time in the mail. Rx for prep to be sent to her [facility] pharmacy.

## 2023-12-13 ENCOUNTER — Other Ambulatory Visit: Payer: Self-pay

## 2023-12-13 ENCOUNTER — Ambulatory Visit (HOSPITAL_COMMUNITY)
Admission: RE | Admit: 2023-12-13 | Discharge: 2023-12-13 | Disposition: A | Discharge status: Home / Self Care | Attending: Gastroenterology | Admitting: Gastroenterology

## 2023-12-13 ENCOUNTER — Encounter (HOSPITAL_COMMUNITY)
Admission: RE | Disposition: A | Discharge status: Home / Self Care | Payer: Self-pay | Source: Home / Self Care | Attending: Gastroenterology

## 2023-12-13 ENCOUNTER — Encounter (HOSPITAL_COMMUNITY): Payer: Self-pay | Admitting: Gastroenterology

## 2023-12-13 ENCOUNTER — Ambulatory Visit (HOSPITAL_COMMUNITY): Admitting: Anesthesiology

## 2023-12-13 DIAGNOSIS — E119 Type 2 diabetes mellitus without complications: Secondary | ICD-10-CM | POA: Insufficient documentation

## 2023-12-13 DIAGNOSIS — D120 Benign neoplasm of cecum: Secondary | ICD-10-CM

## 2023-12-13 DIAGNOSIS — Z7984 Long term (current) use of oral hypoglycemic drugs: Secondary | ICD-10-CM | POA: Insufficient documentation

## 2023-12-13 DIAGNOSIS — Z139 Encounter for screening, unspecified: Secondary | ICD-10-CM | POA: Diagnosis not present

## 2023-12-13 DIAGNOSIS — K5730 Diverticulosis of large intestine without perforation or abscess without bleeding: Secondary | ICD-10-CM | POA: Insufficient documentation

## 2023-12-13 DIAGNOSIS — Z1211 Encounter for screening for malignant neoplasm of colon: Secondary | ICD-10-CM

## 2023-12-13 DIAGNOSIS — D122 Benign neoplasm of ascending colon: Secondary | ICD-10-CM | POA: Insufficient documentation

## 2023-12-13 DIAGNOSIS — K635 Polyp of colon: Secondary | ICD-10-CM | POA: Diagnosis not present

## 2023-12-13 DIAGNOSIS — K219 Gastro-esophageal reflux disease without esophagitis: Secondary | ICD-10-CM | POA: Diagnosis not present

## 2023-12-13 DIAGNOSIS — G4730 Sleep apnea, unspecified: Secondary | ICD-10-CM | POA: Insufficient documentation

## 2023-12-13 DIAGNOSIS — I10 Essential (primary) hypertension: Secondary | ICD-10-CM | POA: Diagnosis not present

## 2023-12-13 DIAGNOSIS — K648 Other hemorrhoids: Secondary | ICD-10-CM | POA: Insufficient documentation

## 2023-12-13 HISTORY — PX: COLONOSCOPY: SHX5424

## 2023-12-13 LAB — HM COLONOSCOPY

## 2023-12-13 SURGERY — COLONOSCOPY
Anesthesia: General

## 2023-12-13 MED ORDER — LACTATED RINGERS IV SOLN: INTRAVENOUS | Status: DC

## 2023-12-13 MED ORDER — SODIUM CHLORIDE FLUSH 0.9 % IV SOLN
INTRAVENOUS | Status: AC
  Filled 2023-12-13: qty 10

## 2023-12-13 MED ORDER — PROPOFOL 500 MG/50ML IV EMUL
INTRAVENOUS | Status: AC
  Filled 2023-12-13: qty 50

## 2023-12-13 MED ORDER — PHENYLEPHRINE 80 MCG/ML (10ML) SYRINGE FOR IV PUSH (FOR BLOOD PRESSURE SUPPORT)
PREFILLED_SYRINGE | INTRAVENOUS | Status: DC | PRN
  Administered 2023-12-13 (×2): 120 ug via INTRAVENOUS
  Administered 2023-12-13: 80 ug via INTRAVENOUS

## 2023-12-13 MED ORDER — LIDOCAINE 2% (20 MG/ML) 5 ML SYRINGE
INTRAMUSCULAR | Status: DC | PRN
Start: 2023-12-13 — End: 2023-12-13
  Administered 2023-12-13: 100 mg via INTRAVENOUS

## 2023-12-13 MED ORDER — PHENYLEPHRINE 80 MCG/ML (10ML) SYRINGE FOR IV PUSH (FOR BLOOD PRESSURE SUPPORT)
PREFILLED_SYRINGE | INTRAVENOUS | Status: AC
  Filled 2023-12-13: qty 10

## 2023-12-13 MED ORDER — PHENYLEPHRINE 80 MCG/ML (10ML) SYRINGE FOR IV PUSH (FOR BLOOD PRESSURE SUPPORT)
PREFILLED_SYRINGE | INTRAVENOUS | Status: AC
Start: 2023-12-13 — End: 2023-12-13
  Filled 2023-12-13: qty 10

## 2023-12-13 MED ORDER — PROPOFOL 10 MG/ML IV BOLUS
INTRAVENOUS | Status: DC | PRN
  Administered 2023-12-13: 50 mg via INTRAVENOUS

## 2023-12-13 MED ORDER — PROPOFOL 500 MG/50ML IV EMUL
INTRAVENOUS | Status: DC | PRN
  Administered 2023-12-13: 150 ug/kg/min via INTRAVENOUS

## 2023-12-13 MED ORDER — LIDOCAINE 2% (20 MG/ML) 5 ML SYRINGE
INTRAMUSCULAR | Status: AC
  Filled 2023-12-13: qty 5

## 2023-12-13 NOTE — Op Note (Signed)
 [facility] Patient Name: Patricia [name] Procedure Date: 12/13/2023 10:32 AM MRN: [id number] Date of Birth: 07/19/1950 Attending MD: Toribio Fortune , , [id number] CSN: [id number] Age: 73 Admit Type: Outpatient Procedure:                Colonoscopy Indications:              Screening for colorectal malignant neoplasm Providers:                Toribio Fortune, Rosina Sprague, Leandrew Edelman RN, RN,                            Daphne Mulch Technician, Technician, [place] Wilson,                            Technician Referring MD:             Toribio Fortune Medicines:                Monitored Anesthesia Care Complications:            No immediate complications. Estimated Blood Loss:     Estimated blood loss: none. Procedure:                Pre-Anesthesia Assessment:                           - Prior to the procedure, a History and Physical                            was performed, and patient medications, allergies                            and sensitivities were reviewed. The patient's                            tolerance of previous anesthesia was reviewed.                           - The risks and benefits of the procedure and the                            sedation options and risks were discussed with the                            patient. All questions were answered and informed                            consent was obtained.                           - ASA Grade Assessment: II - A patient with mild                            systemic disease.                           After obtaining informed consent, the colonoscope  was passed under direct vision. Throughout the                            procedure, the patient's blood pressure, pulse, and                            oxygen saturations were monitored continuously. The                            [id number]-[id number] ([id number]) scope was introduced through                            the anus and advanced to the  the cecum, identified                            by appendiceal orifice and ileocecal valve. The                            colonoscopy was performed without difficulty. The                            patient tolerated the procedure well. Scope In: 10:48:50 AM Scope Out: 11:17:05 AM Scope Withdrawal Time: 0 hours 19 minutes 2 seconds  Total Procedure Duration: 0 hours 28 minutes 15 seconds  Findings:      The perianal and digital rectal examinations were normal.      Ten sessile polyps were found in the ascending colon and cecum. The       polyps were 2 to 8 mm in size. These polyps were removed with a cold       snare. Resection and retrieval were complete.      Scattered medium-mouthed and small-mouthed diverticula were found in the       sigmoid colon, descending colon and ascending colon.      Non-bleeding internal hemorrhoids were found during retroflexion. The       hemorrhoids were small. Impression:               - Ten 2 to 8 mm polyps in the ascending colon and                            in the cecum, removed with a cold snare. Resected                            and retrieved.                           - Diverticulosis in the sigmoid colon, in the                            descending colon and in the ascending colon.                           - Non-bleeding internal hemorrhoids. Moderate Sedation:      Per Anesthesia Care Recommendation:           - Discharge patient to home (  ambulatory).                           - Resume previous diet.                           - Await pathology results.                           - Repeat colonoscopy in 3 years for surveillance.                           Avoid using high dose aspirin including Goody/BC                            powders, NSAIDs such as Aleve, ibuprofen, naproxen,                            Motrin, Voltaren or Advil for one week Procedure Code(s):        --- Professional ---                           [phone], Colonoscopy,  flexible; with removal of                            tumor(s), polyp(s), or other lesion(s) by snare                            technique Diagnosis Code(s):        --- Professional ---                           Z12.11, Encounter for screening for malignant                            neoplasm of colon                           D12.2, Benign neoplasm of ascending colon                           D12.0, Benign neoplasm of cecum                           K64.8, Other hemorrhoids                           K57.30, Diverticulosis of large intestine without                            perforation or abscess without bleeding CPT copyright 2022 American Medical Association. All rights reserved. The codes documented in this report are preliminary and upon coder review may  be revised to meet current compliance requirements. Toribio Fortune, MD Toribio Fortune,  12/13/2023 11:23:41 AM This report has been signed electronically. Number of Addenda: 0

## 2023-12-13 NOTE — Telephone Encounter (Signed)
 Referral completed, TCS apt letter sent to PCP

## 2023-12-13 NOTE — Discharge Instructions (Addendum)
 You are being discharged to home.  Resume your previous diet.  We are waiting for your pathology results.  Your physician has recommended a repeat colonoscopy in three years for surveillance.  Avoid using high dose aspirin including Goody/BC powders, NSAIDs such as Aleve, ibuprofen, naproxen, Motrin, Voltaren or Advil for one week

## 2023-12-13 NOTE — Anesthesia Preprocedure Evaluation (Signed)
 Anesthesia Evaluation  Patient identified by MRN, date of birth, ID band Patient awake    Reviewed: Allergy & Precautions, H&P , NPO status , Patient's Chart, lab work & pertinent test results, reviewed documented beta blocker date and time   History of Anesthesia Complications (+) PONV and history of anesthetic complications  Airway Mallampati: II  TM Distance: >3 FB Neck ROM: full    Dental no notable dental hx.    Pulmonary sleep apnea    Pulmonary exam normal breath sounds clear to auscultation       Cardiovascular Exercise Tolerance: Good hypertension, + DOE   Rhythm:regular Rate:Normal     Neuro/Psych negative neurological ROS  negative psych ROS   GI/Hepatic Neg liver ROS,GERD  ,,  Endo/Other  diabetesHypothyroidism    Renal/GU Renal disease  negative genitourinary   Musculoskeletal   Abdominal   Peds  Hematology negative hematology ROS (+)   Anesthesia Other Findings   Reproductive/Obstetrics negative OB ROS                             Anesthesia Physical Anesthesia Plan  ASA: 2  Anesthesia Plan: General   Post-op Pain Management:    Induction:   PONV Risk Score and Plan: Propofol  infusion  Airway Management Planned:   Additional Equipment:   Intra-op Plan:   Post-operative Plan:   Informed Consent: I have reviewed the patients History and Physical, chart, labs and discussed the procedure including the risks, benefits and alternatives for the proposed anesthesia with the patient or authorized representative who has indicated his/her understanding and acceptance.     Dental Advisory Given  Plan Discussed with: CRNA  Anesthesia Plan Comments:        Anesthesia Quick Evaluation

## 2023-12-13 NOTE — Transfer of Care (Signed)
 Immediate Anesthesia Transfer of Care Note  Patient: Patricia [name]  Procedure(s) Performed: COLONOSCOPY  Patient Location: PACU  Anesthesia Type:MAC  Level of Consciousness: drowsy  Airway & Oxygen Therapy: Patient Spontanous Breathing  Post-op Assessment: Report given to RN and Post -op Vital signs reviewed and stable  Post vital signs: Reviewed and stable  Last Vitals:  Vitals Value Taken Time  BP 104/61 11:22 12/13/23  Temp    Pulse    Resp    SpO2 96% on RA 11:22 12/13/23    Last Pain:  Vitals:   12/13/23 1044  TempSrc:   PainSc: 0-No pain         Complications: No notable events documented.

## 2023-12-13 NOTE — Anesthesia Postprocedure Evaluation (Signed)
 Anesthesia Post Note  Patient: Patricia [name]  Procedure(s) Performed: COLONOSCOPY  Patient location during evaluation: Phase II Anesthesia Type: General Level of consciousness: awake Pain management: pain level controlled Vital Signs Assessment: post-procedure vital signs reviewed and stable Respiratory status: spontaneous breathing and respiratory function stable Cardiovascular status: blood pressure returned to baseline and stable Postop Assessment: no headache and no apparent nausea or vomiting Anesthetic complications: no Comments: Late entry   No notable events documented.   Last Vitals:  Vitals:   12/13/23 0845 12/13/23 1121  BP: (!) 153/73 104/61  Pulse: 92 73  Resp: 17 20  Temp: 36.8 C 36.8 C  SpO2: 99% 98%    Last Pain:  Vitals:   12/13/23 1121  TempSrc: Oral  PainSc: 0-No pain                 Yvonna JINNY Bosworth

## 2023-12-13 NOTE — H&P (Signed)
 Patricia [name] is an 73 y.o. female.   Chief Complaint: CRC screening HPI: 73 y/o F with PMH DM, HTN, hypothyroidism, coming for screening colonoscopy. The patient has never had a colonoscopy in the past.  The patient denies having any complaints such as melena, hematochezia, abdominal pain or distention, change in her bowel movement consistency or frequency, no changes in weight recently.  No family history of colorectal cancer.   Past Medical History:  Diagnosis Date   Arthritis    Diabetes mellitus without complication (HCC)    Hypertension    Hypothyroidism    PONV (postoperative nausea and vomiting)     Past Surgical History:  Procedure Laterality Date   ABDOMINAL HYSTERECTOMY     CHOLECYSTECTOMY N/A 03/02/2015   Procedure: LAPAROSCOPIC CHOLECYSTECTOMY;  Surgeon: Oneil Budge, MD;  Location: [facility];  Service: General;  Laterality: N/A;   CYSTOSCOPY W/ URETERAL STENT PLACEMENT Bilateral 02/02/2022   Procedure: CYSTOSCOPY WITH RETROGRADE PYELOGRAM;  Surgeon: Sherrilee Belvie CROME, MD;  Location: [facility];  Service: Urology;  Laterality: Bilateral;   CYSTOSCOPY WITH RETROGRADE PYELOGRAM, URETEROSCOPY AND STENT PLACEMENT Bilateral 03/03/2022   Procedure: CYSTOSCOPY WITH RETROGRADE PYELOGRAM, URETEROSCOPY AND STENT PLACEMENT;  Surgeon: Sherrilee Belvie CROME, MD;  Location: [facility];  Service: Urology;  Laterality: Bilateral;   HOLMIUM LASER APPLICATION Right 05/02/2019   Procedure: HOLMIUM LASER APPLICATION;  Surgeon: Matilda Senior, MD;  Location: [facility];  Service: Urology;  Laterality: Right;   IR URETERAL STENT RIGHT NEW ACCESS W/O SEP NEPHROSTOMY CATH  05/02/2019   KNEE ARTHROSCOPY Right    LIVER BIOPSY  03/02/2015   Procedure: LAPAROSCOPIC LIVER BIOPSY;  Surgeon: Oneil Budge, MD;  Location: [facility];  Service: General;;   NEPHROLITHOTOMY Right 05/02/2019   Procedure: NEPHROLITHOTOMY PERCUTANEOUS;  Surgeon: Matilda Senior, MD;  Location: [facility];  Service: Urology;  Laterality: Right;   3 HRS   THYROIDECTOMY     TONSILLECTOMY  1970   TUBAL LIGATION      Family History  Problem Relation Age of Onset   Leukemia Mother    Aneurysm Father    Heart attack Maternal Grandmother    Heart attack Maternal Grandfather    Social History:  reports that she has never smoked. She has never used smokeless tobacco. She reports that she does not drink alcohol and does not use drugs.  Allergies:  Allergies  Allergen Reactions   Amlodipine Swelling   Keflex [Cephalexin] Rash    Medications Prior to Admission  Medication Sig Dispense Refill   b complex vitamins capsule Take 1 capsule by mouth daily.     cetirizine (ZYRTEC) 10 MG tablet Take 10 mg by mouth at bedtime.     Cholecalciferol (VITAMIN D3) 50 MCG (2000 UT) CAPS Take 1 capsule by mouth daily at 12 noon.     CINNAMON PO Take 1,000 mg by mouth daily.     cyclobenzaprine  (FLEXERIL ) 5 MG tablet Take 1 tablet (5 mg total) by mouth 3 (three) times daily as needed for muscle spasms. 30 tablet 0   escitalopram  (LEXAPRO ) 5 MG tablet Take 5 mg by mouth daily.     furosemide  (LASIX ) 20 MG tablet Take 1 tablet (20 mg total) by mouth daily as needed (Shortness of breath). 30 tablet 3   levothyroxine  (SYNTHROID ) 200 MCG tablet Take 200 mcg by mouth daily.     Melatonin 10 MG CAPS Take 10 mg by mouth at bedtime as needed (sleep).     meloxicam (MOBIC) 15 MG tablet Take  15 mg by mouth as needed for pain.     metFORMIN  (GLUCOPHAGE ) 500 MG tablet Take 500 mg by mouth 2 (two) times daily. (Patient taking differently: Take 1,000 mg by mouth 2 (two) times daily.)     rOPINIRole  (REQUIP ) 0.5 MG tablet Take 0.5 mg by mouth 3 (three) times daily.     simvastatin  (ZOCOR ) 20 MG tablet Take 20 mg by mouth daily.     TIADYLT  ER 360 MG 24 hr capsule Take 360 mg by mouth daily.     acetaminophen  (TYLENOL ) 500 MG tablet Take 500 mg by mouth every 6 (six) hours as needed for mild pain (pain score 1-3).     mirabegron  ER (MYRBETRIQ ) 50 MG TB24 tablet  Take 1 tablet (50 mg total) by mouth daily. 90 tablet 3    No results found for this or any previous visit (from the past 48 hours). No results found.  Review of Systems  All other systems reviewed and are negative.   Blood pressure (!) 153/73, pulse 92, temperature 98.2 F (36.8 C), temperature source Oral, resp. rate 17, height 5' 4 (1.626 m), weight 83.9 kg, SpO2 99%. Physical Exam  GENERAL: The patient is AO x3, in no acute distress. HEENT: Head is normocephalic and atraumatic. EOMI are intact. Mouth is well hydrated and without lesions. NECK: Supple. No masses LUNGS: Clear to auscultation. No presence of rhonchi/wheezing/rales. Adequate chest expansion HEART: RRR, normal s1 and s2. ABDOMEN: Soft, nontender, no guarding, no peritoneal signs, and nondistended. BS +. No masses. EXTREMITIES: Without any cyanosis, clubbing, rash, lesions or edema. NEUROLOGIC: AOx3, no focal motor deficit. SKIN: no jaundice, no rashes  Assessment/Plan 73 y/o F with PMH DM, HTN, hypothyroidism, coming for screening colonoscopy.  The patient is at average risk for colorectal cancer.  We will proceed with colonoscopy today.   Toribio Eartha Flavors, MD 12/13/2023, 10:44 AM

## 2023-12-14 ENCOUNTER — Ambulatory Visit: Payer: PPO | Admitting: Internal Medicine

## 2023-12-14 ENCOUNTER — Encounter (HOSPITAL_COMMUNITY): Payer: Self-pay | Admitting: Gastroenterology

## 2023-12-14 ENCOUNTER — Ambulatory Visit (INDEPENDENT_AMBULATORY_CARE_PROVIDER_SITE_OTHER): Payer: Self-pay | Admitting: Gastroenterology

## 2023-12-14 ENCOUNTER — Other Ambulatory Visit: Payer: Self-pay | Admitting: Internal Medicine

## 2023-12-14 LAB — SURGICAL PATHOLOGY

## 2023-12-15 ENCOUNTER — Other Ambulatory Visit: Payer: Self-pay

## 2023-12-15 ENCOUNTER — Encounter (HOSPITAL_COMMUNITY): Payer: Self-pay | Admitting: *Deleted

## 2023-12-15 ENCOUNTER — Emergency Department (HOSPITAL_COMMUNITY)

## 2023-12-15 ENCOUNTER — Emergency Department (HOSPITAL_COMMUNITY)
Admission: EM | Admit: 2023-12-15 | Discharge: 2023-12-15 | Disposition: A | Discharge status: Home / Self Care | Attending: Emergency Medicine | Admitting: Emergency Medicine

## 2023-12-15 DIAGNOSIS — R202 Paresthesia of skin: Secondary | ICD-10-CM | POA: Diagnosis not present

## 2023-12-15 DIAGNOSIS — R9082 White matter disease, unspecified: Secondary | ICD-10-CM | POA: Diagnosis not present

## 2023-12-15 DIAGNOSIS — R0689 Other abnormalities of breathing: Secondary | ICD-10-CM | POA: Diagnosis not present

## 2023-12-15 DIAGNOSIS — R531 Weakness: Secondary | ICD-10-CM | POA: Diagnosis not present

## 2023-12-15 DIAGNOSIS — I6782 Cerebral ischemia: Secondary | ICD-10-CM | POA: Diagnosis not present

## 2023-12-15 DIAGNOSIS — Z79899 Other long term (current) drug therapy: Secondary | ICD-10-CM | POA: Insufficient documentation

## 2023-12-15 DIAGNOSIS — I6789 Other cerebrovascular disease: Secondary | ICD-10-CM | POA: Diagnosis not present

## 2023-12-15 DIAGNOSIS — G629 Polyneuropathy, unspecified: Secondary | ICD-10-CM | POA: Diagnosis not present

## 2023-12-15 DIAGNOSIS — R200 Anesthesia of skin: Secondary | ICD-10-CM | POA: Insufficient documentation

## 2023-12-15 DIAGNOSIS — I499 Cardiac arrhythmia, unspecified: Secondary | ICD-10-CM | POA: Diagnosis not present

## 2023-12-15 DIAGNOSIS — I10 Essential (primary) hypertension: Secondary | ICD-10-CM | POA: Diagnosis not present

## 2023-12-15 DIAGNOSIS — G459 Transient cerebral ischemic attack, unspecified: Secondary | ICD-10-CM | POA: Diagnosis not present

## 2023-12-15 DIAGNOSIS — R29810 Facial weakness: Secondary | ICD-10-CM | POA: Diagnosis not present

## 2023-12-15 LAB — CBC WITH DIFFERENTIAL/PLATELET
Abs Immature Granulocytes: 0.04 10*3/uL (ref 0.00–0.07)
Basophils Absolute: 0.1 10*3/uL (ref 0.0–0.1)
Basophils Relative: 1 %
Eosinophils Absolute: 0.3 10*3/uL (ref 0.0–0.5)
Eosinophils Relative: 3 %
HCT: 39.4 % (ref 36.0–46.0)
Hemoglobin: 13.2 g/dL (ref 12.0–15.0)
Immature Granulocytes: 0 %
Lymphocytes Relative: 27 %
Lymphs Abs: 2.8 10*3/uL (ref 0.7–4.0)
MCH: 30.1 pg (ref 26.0–34.0)
MCHC: 33.5 g/dL (ref 30.0–36.0)
MCV: 89.7 fL (ref 80.0–100.0)
Monocytes Absolute: 0.8 10*3/uL (ref 0.1–1.0)
Monocytes Relative: 8 %
Neutro Abs: 6.2 10*3/uL (ref 1.7–7.7)
Neutrophils Relative %: 61 %
Platelets: 330 10*3/uL (ref 150–400)
RBC: 4.39 MIL/uL (ref 3.87–5.11)
RDW: 15.3 % (ref 11.5–15.5)
WBC: 10.1 10*3/uL (ref 4.0–10.5)
nRBC: 0 % (ref 0.0–0.2)

## 2023-12-15 LAB — COMPREHENSIVE METABOLIC PANEL WITH GFR
ALT: 20 U/L (ref 0–44)
AST: 23 U/L (ref 15–41)
Albumin: 3.8 g/dL (ref 3.5–5.0)
Alkaline Phosphatase: 66 U/L (ref 38–126)
Anion gap: 14 (ref 5–15)
BUN: 14 mg/dL (ref 8–23)
CO2: 21 mmol/L — ABNORMAL LOW (ref 22–32)
Calcium: 9 mg/dL (ref 8.9–10.3)
Chloride: 108 mmol/L (ref 98–111)
Creatinine, Ser: 0.81 mg/dL (ref 0.44–1.00)
GFR, Estimated: >60 mL/min (ref >60)
Glucose, Bld: 122 mg/dL — ABNORMAL HIGH (ref 70–99)
Potassium: 4.1 mmol/L (ref 3.5–5.1)
Sodium: 143 mmol/L (ref 135–145)
Total Bilirubin: 0.5 mg/dL (ref 0.0–1.2)
Total Protein: 6.8 g/dL (ref 6.5–8.1)

## 2023-12-15 LAB — PROTIME-INR
INR: 0.9 (ref 0.8–1.2)
Prothrombin Time: 13.1 s (ref 11.4–15.2)

## 2023-12-15 LAB — CBG MONITORING, ED: Glucose-Capillary: 131 mg/dL — ABNORMAL HIGH (ref 70–99)

## 2023-12-15 NOTE — ED Provider Notes (Signed)
 [place] [facility]Y DEPARTMENT AT [facility] Provider Note   CSN: [id number] Arrival date & time: 12/15/23  1417     Patient presents with: Numbness   Patricia [name] is a 73 y.o. female.   Patient reports she began experiencing numbness to both sides of her face and around her mouth around 130.  Patient states she has never had a sensation like this before.  Patient reports that she felt like she had been to the dentist and that she was numb on both sides.  Patient reports currently she is only numb around her lips.  Patient states she has not had any weakness.  Patient has not had any nausea or vomiting.  Patient denies any new food intakes.  She is not having any chest pain she is not having any abdominal pain.  No difficulty ambulating no difficulty seeing, hearing speaking.  Patient denies any extremity weakness.  Patient states that she does have neuropathy in her feet and this causes her discomfort.  Patient has been on gabapentin without relief.  The history is provided by the patient. No language interpreter was used.       Prior to Admission medications   Medication Sig Start Date End Date Taking? Authorizing Provider  acetaminophen  (TYLENOL ) 500 MG tablet Take 500 mg by mouth every 6 (six) hours as needed for mild pain (pain score 1-3).    [provider]  b complex vitamins capsule Take 1 capsule by mouth daily.    [provider]  cetirizine (ZYRTEC) 10 MG tablet Take 10 mg by mouth at bedtime.    [provider]  Cholecalciferol (VITAMIN D3) 50 MCG (2000 UT) CAPS Take 1 capsule by mouth daily at 12 noon.    [provider]  CINNAMON PO Take 1,000 mg by mouth daily.    [provider]  cyclobenzaprine  (FLEXERIL ) 5 MG tablet Take 1 tablet (5 mg total) by mouth 3 (three) times daily as needed for muscle spasms. 09/14/22   McKenzie, Belvie CROME, MD  escitalopram  (LEXAPRO ) 5 MG tablet Take 5 mg by mouth daily. 07/17/19    [provider]  furosemide  (LASIX ) 20 MG tablet Take 1 tablet (20 mg total) by mouth daily as needed (Shortness of breath). 03/17/23   Mallipeddi, Vishnu P, MD  levothyroxine  (SYNTHROID ) 200 MCG tablet Take 200 mcg by mouth daily. 02/15/23   [provider]  Melatonin 10 MG CAPS Take 10 mg by mouth at bedtime as needed (sleep).    [provider]  meloxicam (MOBIC) 15 MG tablet Take 15 mg by mouth as needed for pain.    [provider]  metFORMIN  (GLUCOPHAGE ) 500 MG tablet Take 500 mg by mouth 2 (two) times daily. Patient taking differently: Take 1,000 mg by mouth 2 (two) times daily. 11/08/21   [provider]  mirabegron  ER (MYRBETRIQ ) 50 MG TB24 tablet Take 1 tablet (50 mg total) by mouth daily. 11/23/23   Larocco, Sarah C, FNP  rOPINIRole  (REQUIP ) 0.5 MG tablet Take 0.5 mg by mouth 3 (three) times daily.    [provider]  simvastatin  (ZOCOR ) 20 MG tablet Take 20 mg by mouth daily.    [provider]  TIADYLT  ER 360 MG 24 hr capsule Take 360 mg by mouth daily. 11/28/21   [provider]    Allergies: Amlodipine and Keflex [cephalexin]    Review of Systems  All other systems reviewed and are negative.   Updated Vital Signs BP ROLLEN)  144/71   Pulse 80   Temp 98.1 F (36.7 C) (Oral)   Resp 13   Ht 5' 4 (1.626 m)   Wt 83.9 kg   SpO2 97%   BMI 31.76 kg/m   Physical Exam Vitals and nursing note reviewed.  Constitutional:      Appearance: Normal appearance. She is well-developed.  HENT:     Head: Normocephalic and atraumatic.     Right Ear: Tympanic membrane normal.     Left Ear: Tympanic membrane normal.     Nose: Nose normal.     Mouth/Throat:     Mouth: Mucous membranes are moist.   Eyes:     Extraocular Movements: Extraocular movements intact.     Conjunctiva/sclera: Conjunctivae normal.     Pupils: Pupils are equal, round, and reactive to light.    Cardiovascular:     Rate and Rhythm: Normal rate.   Pulmonary:     Effort: Pulmonary effort is normal.  Abdominal:     General: There is no distension.   Musculoskeletal:        General: Normal range of motion.     Cervical back: Normal range of motion.   Skin:    General: Skin is warm.   Neurological:     General: No focal deficit present.     Mental Status: She is alert and oriented to person, place, and time.     Cranial Nerves: No cranial nerve deficit.     Sensory: No sensory deficit.     Motor: No weakness.     Coordination: Coordination normal.     Gait: Gait normal.     Deep Tendon Reflexes: Reflexes normal.   Psychiatric:        Mood and Affect: Mood normal.     (all labs ordered are listed, but only abnormal results are displayed) Labs Reviewed  COMPREHENSIVE METABOLIC PANEL WITH GFR - Abnormal; Notable for the following components:      Result Value   CO2 21 (*)    Glucose, Bld 122 (*)    All other components within normal limits  CBG MONITORING, ED - Abnormal; Notable for the following components:   Glucose-Capillary 131 (*)    All other components within normal limits  CBC WITH DIFFERENTIAL/PLATELET  PROTIME-INR    EKG: EKG Interpretation Date/Time:  Friday December 15 2023 14:37:48 EDT Ventricular Rate:  84 PR Interval:  146 QRS Duration:  86 QT Interval:  386 QTC Calculation: 457 R Axis:   -17  Text Interpretation: Sinus rhythm Borderline left axis deviation Low voltage, precordial leads Consider anterior infarct similar to Sept 2024 Confirmed by Freddi Hamilton [phone]) on 12/15/2023 3:50:27 PM  Radiology: CT Head Wo Contrast Result Date: 12/15/2023 CLINICAL DATA:  Neuro deficit, acute, stroke suspected facail numbness EXAM: CT HEAD WITHOUT CONTRAST TECHNIQUE: Contiguous axial images were obtained from the base of the skull through the vertex without intravenous contrast. RADIATION DOSE REDUCTION: This exam was performed according to the departmental dose-optimization program which includes automated  exposure control, adjustment of the mA and/or kV according to patient size and/or use of iterative reconstruction technique. COMPARISON:  CT of the head dated February 01, 2022. FINDINGS: Brain: Age-related cerebral volume loss and mild periventricular white matter disease. Probable chronic ischemic changes within the thalami bilaterally. No evidence of hemorrhage, mass, acute cortical infarct or hydrocephalus. Vascular: Mild calcific atheromatous disease. Skull: Intact and unremarkable. Sinuses/Orbits: Clear paranasal sinuses.  Normal orbits. Other: None. IMPRESSION: 1. Chronic ischemic small vessel  disease. No apparent acute process. Electronically Signed   By: Evalene Coho M.D.   On: 12/15/2023 16:32     Procedures   Medications Ordered in the ED - No data to display                                  Medical Decision Making Patient complains of numbness around both sides of her mouth.  Patient reports symptoms are improving.  Amount and/or Complexity of Data Reviewed Independent Historian:     Details: Patient is here with family who is supportive. Labs: ordered. Decision-making details documented in ED Course.    Details: Labs ordered reviewed and interpreted.  Bcc and Cmet o acute findings  Radiology: ordered.    Details: CT head no acute findings. ECG/medicine tests: ordered and independent interpretation performed. Decision-making details documented in ED Course.    Details: Sinus rhythm 84 no acute findings.   Risk Risk Details: Patient reevaluated.  All symptoms have resolved.  Patient is able to eat and drink.  Patient has no weakness no current numbness.  Patient is advised to schedule follow-up with Dr. Marvine for recheck. she is advised to return if any problems.        Final diagnoses:  Paresthesia    ED Discharge Orders     None       An After Visit Summary was printed and given to the patient.    Flint Sonny POUR, [place] 12/15/23 1759    Freddi Hamilton, MD 12/17/23 [phone]

## 2023-12-15 NOTE — ED Notes (Signed)
 EDP made aware of pts facial numbness on bilateral sides of face

## 2023-12-15 NOTE — ED Triage Notes (Addendum)
 Pt with funny feeling to posterior head and facial numbness all over both sides.  + anxiety. Numbness around mouth currently. Pt states symptoms started at 1315. Denies any HA, family member at bedside and reported there was facial droop, no facial droop noted at time pt arrived by [facility].

## 2023-12-15 NOTE — Discharge Instructions (Addendum)
 Return if any problems.

## 2023-12-17 ENCOUNTER — Other Ambulatory Visit: Payer: Self-pay | Admitting: Internal Medicine

## 2023-12-19 ENCOUNTER — Encounter (INDEPENDENT_AMBULATORY_CARE_PROVIDER_SITE_OTHER): Payer: Self-pay | Admitting: *Deleted

## 2023-12-19 NOTE — Progress Notes (Signed)
 3 yr TCS noted in recall Patient result letter mailed procedure note and pathology result faxed to PCP

## 2023-12-20 DIAGNOSIS — R251 Tremor, unspecified: Secondary | ICD-10-CM | POA: Diagnosis not present

## 2023-12-20 DIAGNOSIS — Z6828 Body mass index (BMI) 28.0-28.9, adult: Secondary | ICD-10-CM | POA: Diagnosis not present

## 2023-12-20 DIAGNOSIS — E663 Overweight: Secondary | ICD-10-CM | POA: Diagnosis not present

## 2023-12-21 ENCOUNTER — Encounter: Admitting: Orthopedic Surgery

## 2024-01-25 ENCOUNTER — Encounter: Admitting: Orthopedic Surgery

## 2024-01-30 DIAGNOSIS — E782 Mixed hyperlipidemia: Secondary | ICD-10-CM | POA: Diagnosis not present

## 2024-01-30 DIAGNOSIS — E1140 Type 2 diabetes mellitus with diabetic neuropathy, unspecified: Secondary | ICD-10-CM | POA: Diagnosis not present

## 2024-01-30 DIAGNOSIS — Z6830 Body mass index (BMI) 30.0-30.9, adult: Secondary | ICD-10-CM | POA: Diagnosis not present

## 2024-01-30 DIAGNOSIS — E6609 Other obesity due to excess calories: Secondary | ICD-10-CM | POA: Diagnosis not present

## 2024-01-30 DIAGNOSIS — E7849 Other hyperlipidemia: Secondary | ICD-10-CM | POA: Diagnosis not present

## 2024-01-30 DIAGNOSIS — E890 Postprocedural hypothyroidism: Secondary | ICD-10-CM | POA: Diagnosis not present

## 2024-02-22 ENCOUNTER — Ambulatory Visit: Admitting: Orthopedic Surgery

## 2024-02-22 ENCOUNTER — Encounter: Payer: Self-pay | Admitting: Orthopedic Surgery

## 2024-02-22 DIAGNOSIS — G8929 Other chronic pain: Secondary | ICD-10-CM | POA: Diagnosis not present

## 2024-02-22 DIAGNOSIS — M1712 Unilateral primary osteoarthritis, left knee: Secondary | ICD-10-CM

## 2024-02-22 DIAGNOSIS — M25562 Pain in left knee: Secondary | ICD-10-CM | POA: Diagnosis not present

## 2024-02-22 NOTE — Progress Notes (Signed)
   There were no vitals taken for this visit.  There is no height or weight on file to calculate BMI.  Chief Complaint  Patient presents with   Knee Pain    Left / getting better     No diagnosis found.  DOI/DOS/ Date: ongoing   Improved

## 2024-02-22 NOTE — Progress Notes (Signed)
   Subjective:     Patient ID: Marval DELENA Harvest, female   DOB: 01-15-1951, 73 y.o.   MRN: [id number]  73 year old female follow-up regarding her left knee.  She has osteoarthritis and she is also diabetic her hemoglobin A1c is close to under 8.  On her last visit in June she had an injection for OA  She says she has had some flareups but they respond to topical medication ice and Advil  Denies any catching locking or giving way  Knee Pain      Review of Systems unrelated     Objective:   Physical Exam Musculoskeletal:     Left knee: No swelling, deformity, effusion, erythema, ecchymosis, lacerations or bony tenderness. No tenderness. No LCL laxity, MCL laxity, ACL laxity or PCL laxity.Normal alignment.     Comments: Range of motion flexion 115, extension almost to 0       Assessment:     Encounter Diagnoses  Name Primary?   Chronic pain of left knee Yes   Primary osteoarthritis of left knee        Plan:     She is doing well her arthritis is stable at present follow-up as needed continue present management  Chronic condition stable, moderate risk risk

## 2024-04-03 ENCOUNTER — Encounter (INDEPENDENT_AMBULATORY_CARE_PROVIDER_SITE_OTHER): Payer: Self-pay | Admitting: Gastroenterology

## 2024-04-22 ENCOUNTER — Encounter: Payer: Self-pay | Admitting: Radiology

## 2024-04-23 DIAGNOSIS — G2581 Restless legs syndrome: Secondary | ICD-10-CM | POA: Diagnosis not present

## 2024-04-23 DIAGNOSIS — E119 Type 2 diabetes mellitus without complications: Secondary | ICD-10-CM | POA: Diagnosis not present

## 2024-04-23 DIAGNOSIS — E039 Hypothyroidism, unspecified: Secondary | ICD-10-CM | POA: Diagnosis not present

## 2024-04-23 DIAGNOSIS — G250 Essential tremor: Secondary | ICD-10-CM | POA: Diagnosis not present

## 2024-04-23 DIAGNOSIS — E785 Hyperlipidemia, unspecified: Secondary | ICD-10-CM | POA: Diagnosis not present

## 2024-04-23 DIAGNOSIS — I10 Essential (primary) hypertension: Secondary | ICD-10-CM | POA: Diagnosis not present

## 2024-05-13 ENCOUNTER — Ambulatory Visit (HOSPITAL_COMMUNITY)
Admission: RE | Admit: 2024-05-13 | Discharge: 2024-05-13 | Disposition: A | Discharge status: Home / Self Care | Source: Ambulatory Visit | Attending: Urology | Admitting: Urology

## 2024-05-13 DIAGNOSIS — N200 Calculus of kidney: Secondary | ICD-10-CM | POA: Insufficient documentation

## 2024-05-13 DIAGNOSIS — N281 Cyst of kidney, acquired: Secondary | ICD-10-CM | POA: Diagnosis not present

## 2024-05-24 ENCOUNTER — Ambulatory Visit: Admitting: Urology

## 2024-06-17 ENCOUNTER — Ambulatory Visit
